# Patient Record
Sex: Female | Born: 1960
Health system: Southern US, Community
[De-identification: ages and names within clinical notes are randomized; demographics above are authoritative.]

## PROBLEM LIST (undated history)

## (undated) DIAGNOSIS — R269 Unspecified abnormalities of gait and mobility: Secondary | ICD-10-CM

## (undated) DIAGNOSIS — G35 Multiple sclerosis: Secondary | ICD-10-CM

## (undated) DIAGNOSIS — R519 Headache, unspecified: Secondary | ICD-10-CM

## (undated) DIAGNOSIS — F329 Major depressive disorder, single episode, unspecified: Secondary | ICD-10-CM

## (undated) DIAGNOSIS — R51 Headache: Secondary | ICD-10-CM

## (undated) DIAGNOSIS — F32A Depression, unspecified: Secondary | ICD-10-CM

## (undated) DIAGNOSIS — E785 Hyperlipidemia, unspecified: Secondary | ICD-10-CM

## (undated) HISTORY — DX: Major depressive disorder, single episode, unspecified: F32.9

## (undated) HISTORY — DX: Hyperlipidemia, unspecified: E78.5

## (undated) HISTORY — DX: Unspecified abnormalities of gait and mobility: R26.9

## (undated) HISTORY — DX: Depression, unspecified: F32.A

## (undated) HISTORY — PX: TUBAL LIGATION: SHX77

## (undated) HISTORY — DX: Multiple sclerosis: G35

## (undated) HISTORY — PX: DENTAL SURGERY: SHX609

## (undated) NOTE — *Deleted (*Deleted)
Integrated Behavioral Health Initial In-Person Visit  MRN: [id number] Name: Kristen Boyle  Number of Integrated Behavioral Health Clinician visits:: 1/6 Session Start time: 2:30pm  Session End time: 3:00pm Total time: 30 minutes  Types of Service: Individual psychotherapy  Interpretor:{yes [id number]} Interpretor Name and Language: ***   Warm Hand Off Completed.       Subjective: [name] is a 16 y.o. female accompanied by {CHL AMB ACCOMPANIED YN:[id number]} Patient was referred by *** for ***. Patient reports the following symptoms/concerns: *** Duration of problem: ***; Severity of problem: {Mild/Moderate/Severe:20260}  Objective: Mood: {BHH MOOD:22306} and Affect: {BHH AFFECT:22307} Risk of harm to self or others: {CHL AMB BH Suicide Current Mental Status:21022748}  Life Context: Family and Social: *** School/Work: *** Self-Care: *** Life Changes: ***  Patient and/or Family's Strengths/Protective Factors: {CHL AMB BH PROTECTIVE FACTORS:[phone]}  Goals Addressed: Patient will: 1. Reduce symptoms of: {IBH Symptoms:21014056} 2. Increase knowledge and/or ability of: {IBH Patient Tools:21014057}  3. Demonstrate ability to: {IBH Goals:21014053}  Progress towards Goals: {CHL AMB BH PROGRESS TOWARDS GOALS:6151297580}  Interventions: Interventions utilized: {IBH Interventions:21014054}  Standardized Assessments completed: {IBH Screening Tools:21014051}  Patient and/or Family Response: ***  Patient Centered Plan: Patient is on the following Treatment Plan(s):  ***  Assessment: Patient currently experiencing ***.   Patient may benefit from ***.  Plan: 1. Follow up with behavioral health clinician on : *** 2. Behavioral recommendations: *** 3. Referral(s): {IBH Referrals:21014055} 4. "From scale of 1-10, how likely are you to follow plan?": ***  Deneise Lever, LMFT

---

## 2010-07-03 ENCOUNTER — Emergency Department (HOSPITAL_COMMUNITY)
Admission: EM | Admit: 2010-07-03 | Discharge: 2010-07-03 | Disposition: A | Discharge status: Home / Self Care | Payer: No Typology Code available for payment source | Attending: Emergency Medicine | Admitting: Emergency Medicine

## 2010-07-03 ENCOUNTER — Emergency Department (HOSPITAL_COMMUNITY): Payer: Self-pay

## 2010-07-03 DIAGNOSIS — M542 Cervicalgia: Secondary | ICD-10-CM | POA: Insufficient documentation

## 2010-07-03 DIAGNOSIS — G35 Multiple sclerosis: Secondary | ICD-10-CM | POA: Insufficient documentation

## 2010-07-03 DIAGNOSIS — M25519 Pain in unspecified shoulder: Secondary | ICD-10-CM | POA: Insufficient documentation

## 2010-07-03 DIAGNOSIS — M25559 Pain in unspecified hip: Secondary | ICD-10-CM | POA: Insufficient documentation

## 2010-07-03 DIAGNOSIS — T148XXA Other injury of unspecified body region, initial encounter: Secondary | ICD-10-CM | POA: Insufficient documentation

## 2010-09-10 ENCOUNTER — Encounter: Payer: No Typology Code available for payment source | Admitting: Internal Medicine

## 2010-09-17 ENCOUNTER — Ambulatory Visit (INDEPENDENT_AMBULATORY_CARE_PROVIDER_SITE_OTHER): Payer: Self-pay | Admitting: Internal Medicine

## 2010-09-17 ENCOUNTER — Encounter: Payer: Self-pay | Admitting: Internal Medicine

## 2010-09-17 DIAGNOSIS — E119 Type 2 diabetes mellitus without complications: Secondary | ICD-10-CM

## 2010-09-17 DIAGNOSIS — E785 Hyperlipidemia, unspecified: Secondary | ICD-10-CM | POA: Insufficient documentation

## 2010-09-17 DIAGNOSIS — D649 Anemia, unspecified: Secondary | ICD-10-CM

## 2010-09-17 DIAGNOSIS — I10 Essential (primary) hypertension: Secondary | ICD-10-CM | POA: Insufficient documentation

## 2010-09-17 LAB — LIPID PANEL
Cholesterol: 246 mg/dL — ABNORMAL HIGH (ref 0–200)
Total CHOL/HDL Ratio: 3.2 Ratio
Triglycerides: 114 mg/dL (ref <150)
VLDL: 23 mg/dL (ref 0–40)

## 2010-09-17 LAB — CBC
Hemoglobin: 12.9 g/dL (ref 12.0–15.0)
MCH: 24.5 pg — ABNORMAL LOW (ref 26.0–34.0)
MCHC: 32.5 g/dL (ref 30.0–36.0)
MCV: 75.3 fL — ABNORMAL LOW (ref 78.0–100.0)
Platelets: 307 10*3/uL (ref 150–400)
RBC: 5.27 MIL/uL — ABNORMAL HIGH (ref 3.87–5.11)

## 2010-09-17 LAB — COMPREHENSIVE METABOLIC PANEL
CO2: 26 mEq/L (ref 19–32)
Creat: 0.98 mg/dL (ref 0.50–1.10)
Glucose, Bld: 86 mg/dL (ref 70–99)
Total Bilirubin: 0.4 mg/dL (ref 0.3–1.2)

## 2010-09-17 NOTE — Progress Notes (Signed)
  Subjective:    Patient ID: Kristen Boyle, female    DOB: April 21, 1960, 50 y.o.   MRN: [id number]  HPI Patient is a 50 year old female with history of multiple sclerosis diagnosed several years ago who presents to clinic as a new patient. She denies medical problems except multiple sclerosis which she is not sure when it was diagnosed that she has stopped her Inderal and that she follows with. She tells me she's currently not on any medications for MS. She is not sure if she has high blood pressure and high cholesterol but was told at one point if the numbers were slightly above the target goal. She takes no medications at the time. She denies recent sicknesses or hospitalizations, no abdominal or urinary concerns no fevers or chills. She reports feeling tired over the past several years but relays symptoms to menopause. She reports hot flashes and sweats and also believes is because of the menopause. She has not followed with any physician in [place] over the past few years.   Review of Systems Per HPI    Objective:   Physical Exam  Constitutional: Vital signs reviewed.  Patient is a well-developed and well-nourished in no acute distress and cooperative with exam. Alert and oriented x3.  Head: Normocephalic and atraumatic Ear: TM normal bilaterally Mouth: no erythema or exudates, MMM Eyes: PERRL, EOMI, conjunctivae normal, No scleral icterus.  Neck: Supple, Trachea midline normal ROM, No JVD, mass, thyromegaly, or carotid bruit present.  Cardiovascular: RRR, S1 normal, S2 normal, no MRG, pulses symmetric and intact bilaterally Pulmonary/Chest: CTAB, no wheezes, rales, or rhonchi Abdominal: Soft. Non-tender, non-distended, bowel sounds are normal, no masses, organomegaly, or guarding present.  GU: no CVA tenderness Musculoskeletal: No joint deformities, erythema, or stiffness, ROM full and no nontender Hematology: no cervical, inginal, or axillary adenopathy.  Neurological: A&O x3,  Strenght is normal and symmetric bilaterally, cranial nerve II-XII are grossly intact, no focal motor deficit, sensory intact to light touch bilaterally.  Skin: Warm, dry and intact. No rash, cyanosis, or clubbing.  Psychiatric: Normal mood and affect. speech and behavior is normal. Judgment and thought content normal. Cognition and memory are normal.         Assessment & Plan:

## 2010-09-17 NOTE — Assessment & Plan Note (Signed)
Patient is currently not on medication regimen. We will check fasting lipid panel as well as liver function test and will initiate treatment if indicated.

## 2010-09-17 NOTE — Assessment & Plan Note (Signed)
Blood pressure is slightly above the target goal. I have advised patient to continue monitoring her blood pressure regularly and to call back if the numbers are persistently high then 140/90. We will check electrolyte panel today. Will not initiate medical regimen at this point. Diet and exercise along with therapeutic lifestyle changes discussed during the appointment today.

## 2010-09-18 ENCOUNTER — Telehealth: Payer: Self-pay | Admitting: Internal Medicine

## 2010-09-18 NOTE — Telephone Encounter (Signed)
Pt scheduled for Thursday.

## 2010-09-18 NOTE — Telephone Encounter (Signed)
Can we please have pt come in for appointment to go over the blood work. We checked her liver tests and the results are above the normal limits and she also has hypercalcemia, so she will need repeat bloodwork, actually just cmet. I am not in clinic after today so maybe any other resident who is available can see her. Thank you for your help.

## 2010-09-20 ENCOUNTER — Encounter: Payer: Self-pay | Admitting: Internal Medicine

## 2010-09-20 ENCOUNTER — Ambulatory Visit (INDEPENDENT_AMBULATORY_CARE_PROVIDER_SITE_OTHER): Payer: Medicaid Other | Admitting: Internal Medicine

## 2010-09-20 ENCOUNTER — Other Ambulatory Visit: Payer: Medicaid Other

## 2010-09-20 VITALS — BP 141/89 | HR 73 | Temp 97.3°F | Wt 132.9 lb

## 2010-09-20 DIAGNOSIS — Z0000 Encounter for general adult medical examination without abnormal findings: Secondary | ICD-10-CM

## 2010-09-20 NOTE — Patient Instructions (Signed)
You were seen today to discuss your lab results with Dr. Dorise Hiss. We talked about how your thyroid is fine, you do not have diabetes, your kidneys are fine, your blood counts are fine, you have a little bit of increase in your liver enzymes and we are going to recheck them today. Your cholesterol is at the top of normal. Please try to work on your diet and increasing the amount of exercise you do. I will give you some stretches for your neck and diet tips today. Please come back in 6 months to repeat some lab work and meet your regular doctor, Dr. Milbert Coulter. If there are any problems with today's lab results I will call you. If you are feeling sick or have worsening of your pain please feel free to call us to be seen sooner. Our number is [phone].  Neck Pain exercises Home exercise - Home exercise maintains range of motion and helps patients become active participants in their care. Gentle stretching exercises, including shoulder rolls and neck stretches, should be instituted on a daily basis once acute symptoms are under control: Neck rotation - Slowly turn the head to the right. Place tension on the chin with the fingertips. Hold for a few seconds and return to the center. Repeat to the left.  Neck tilting - Tilt the head to the right, trying to touch the ear to the tip of the shoulder. Place tension on the temple with the fingertips. Hold for a few seconds and return to the center. Repeat to the left.  Neck bending - Try to touch the chin to the chest. Hold for a few seconds and return to the neutral position. Breathe in gradually, and exhale slowly with each exercise. Relax the neck and back muscles with each neck bend.  Shoulder rolls - In the sitting or standing position, pull the arms backwards. Try to pinch the shoulder blades together, and then roll the arms forward then backward in a rhythmic, rowing motion. Patients should be advised to heat their neck and upper back in a bathtub, shower, or  with moist towels heated in a microwave prior to beginning exercises. The muscles are gently stretched in sets of 10 to 15 repetitions with each held for five seconds. It is best to perform the exercises in the morning and just before sleeping. After the acute pain has resolved, stretching exercises should be continued three times per week to maintain neck flexibility.  Cholesterol Control Modern scientists have known for a long time that cholesterol levels in the body are related to coronary heart disease and that diet affects these levels. The following material helps to explain this relationship and discusses what you can do to help keep your heart healthy. Not all cholesterol is bad. Low-density lipoprotein (LDL) cholesterol is the "bad" cholesterol which may cause fatty deposits to build up inside your arteries. High-density lipoprotein (HDL) cholesterol is "good". It helps to remove the "bad" LDL cholesterol from your blood. Cholesterol is a very important risk factor for coronary heart disease. Other risk factors are high blood pressure, smoking, stress, heredity, and weight. The heart muscle gets its supply of blood through the coronary arteries. If your LDL ("bad") cholesterol is high and your HDL ("good") cholesterol is low, you have a risk factor for fatty deposits accumulating in your coronary arteries (a vessel providing blood to the heart). This leaves less room through which blood can flow. Without sufficient blood and the oxygen, the heart muscle cannot function properly, and  you may feel chest pains (called angina pectoris). When a coronary artery closes up entirely, a part of the heart muscle may die (called a myocardial infarction). CHECKING CHOLESTEROL When your caregiver sends your blood to a lab to be analyzed for cholesterol, they may do a complete lipid (fat) profile. With this test the total amount of cholesterol, as well as levels of LDL and HDL, are determined. The chart below  describes what the numbers should be: Test Goal  Total Cholesterol Less than 200 mg/dl  LDL "bad cholesterol" Less than 160 mg/dl for those at low risk for heart disease Less than 130 mg/dl for those at intermediate risk for heart disease. Less than 100 mg/dl for those with diabetes or at high risk for heart disease Less than 70 mg/dl for those at very high risk for heart disease  HDL "good cholesterol" Women: Greater than 50 mg/dl Men: Greater than 40 mg/dl  Triglycerides Less than 150 mg/dl  Reference: [email or web address] 02/04/06 CONTROLLING CHOLESTEROL WITH DIET Although such factors as exercise and life-style are important, the "first line of attack" is diet. That is because certain foods are known to raise cholesterol and others to lower it. So the goal for most Americans is to balance foods for their effect on cholesterol and, even more important, to replace saturated fat with other types of fat, such as monounsaturated and polyunsaturated fats, and omega-3 fatty acids. The average American takes in about 500 to 600 milligrams (mg) of cholesterol and about 40 grams (g) of saturated fat daily. Ideally, these figures should be reduced to 300 mg of cholesterol and 15 to 17 g of saturated fat, or even lower in people who have coronary artery disease or a history of heart attack. But that does not mean you have to sacrifice all your favorite foods. Today, as the table at the end of this document shows, there are good-tasting, low-fat, low-cholesterol substitutes for most of the things you like to eat. Which foods should you choose? Choose low-fat or non-fat alternatives. Choose round or loin cuts of red meat as these types of cuts are lowest in fat and cholesterol. Chicken (without the skin), fish, veal and ground [place] breast are excellent choices. Eliminate fatty meats like hotdogs and salami. Even shellfish have little or no saturated fat and so, despite their high cholesterol  content, are allowable in moderation. When you eat lean meat, poultry, or fish, have a 3 oz. portion. For baking and cooking, oils are an excellent substitute for butter. The monounsaturated oils are of particular benefit since it is believed they lower LDL (the bad cholesterol) but raise HDL. The oils you should avoid entirely are the saturated tropical oils such as coconut and palm.  Remember to eat liberally from food groups that are naturally free of cholesterol and saturated fat, including fish, fruit, vegetables, beans, grains (barley, rice, couscous, bul-gur wheat), and pasta (without cream sauces).  IDENTIFYING FOODS THAT LOWER CHOLESTEROL . . . Soluble fiber found in fruits such as apples; vegetables such as broccoli, potatoes, and carrots; legumes such as beans, peas, and lentils; and grains such as barley may lower your cholesterol. Foods fortified with plant sterols, or phytosterol, may also lower cholesterol. You should eat at least 2g per day of these foods for a cholesterol lowering effect.  How can you identify low-cholesterol, low-fat foods at the supermarket? The key is to read package labels. Select cheeses that have only 2-3 g saturated fat per ounce. Use a heart healthy tub  margarine free of partially hydrogenated oil such as Insurance claims handler. When buying baked goods (cookies, crackers), avoid partially hydrogenated oils. Breads and muffins should be made from whole grains (whole wheat or whole oat flour, instead of just "flour" or "enriched flour"). Buy non-creamy canned soups with reduced salt and no added fats.  FOOD PREPARATION TECHNIQUES . . . Never deep fry. If you must fry, either stir fry, which uses very little fat, or use the non-stick cooking sprays like Pam. When possible, broil, bake, or roast meats, and steam vegetables. Instead of dressing vegetables with butter or margarine, use lemon and herbs, applesauce and cinnamon (for squash and sweet potatoes), non-fat  yogurt, salsa, and low-fat dressings for salads.  See the following table for food, cholesterol, and fat information. FOODS HIGH IN CHOLESTEROL AND/OR SATURATED FAT LOW CHOLESTEROL/LOW-FAT SUBSTITUTES   Cholesterol (milligrams) Saturated Fat (grams)  Cholesterol (milligrams) Saturated Fat (grams)  Steak, marbled (3 oz) 90 11 Steak, lean (3 oz) 50 4  Hamburger (3 oz) 80 7 Hamburger, lean (3 oz) 50 5  Ham (3 oz) 53 6 Ham, lean cut (3 oz) 35 2.4  Chicken, with skin  (3 oz)   Chicken, skin removed, 3 oz    Dark meat 90 4 Dark meat 80 2  Light meat 80 2.5 Light meat 70 1  Egg (1 Large) 200-300 1.7 Egg substitutes ("Eggbeaters") 0 0  Whole Milk (1 cup) 33 5 Low-fat milk (2%)  (1 cup) 18 3     Low-fat milk (1%)  (1 cup) 10 1.5     Skim milk (1 cup) trace 0.3  Hard Cheese (1 oz) 30 6 Skim milk cheese  (1 oz) 16 2-3  Cottage Cheese  (1 cup) (4% fat) 35 6.5 Low-fat cottage cheese (1 cup) (1% fat) 10 1.5  Ice cream (1 cup) 60 9 Sherbet (1 cup) 14 2.5     Non-fat frozen yogurt (1 cup) 0 0.3     Frozen fruit bars 0 trace  Whipped cream  (1 tbsp) 20 3.5 Non-dairy whipped toppings (1 tbsp) trace 1  Mayonnaise  (1 tbsp) 5 2 Low-fat mayonnaise (1 tbsp) 5 1  Butter (1 tbsp) 30 7 Extra light margarines ( tbsp) 0 1  Oils (1 tbsp)   Oils (1 tbsp)    Saturated   Monounsaturated    Coconut oil 0 11.8 Olive 0 1.8     Polyunsaturated       Corn 0 1.7     Safflower 0 1.2     Sunflower 0 1.4     Soybean 0 2.4  Document Released: 01/07/2005 Document Re-Released: 11/04/2006 [facility] Patient Information 2011 [place], [place].

## 2010-09-20 NOTE — Progress Notes (Signed)
Subjective:    Patient ID: Kristen Boyle, female    DOB: May 23, 1960, 50 y.o.   MRN: [id number]  HPI: The patient is a 50 year-old female who comes in today to discuss the results of her lab work. She was new to our clinic approximately 52 years ago and did get some lab work done, we did discuss the results of those lab work with her today. I did discuss the results of her TSH, hemoglobin A1c, CBC, CMP, cholesterol panel. The only slightly abnormal lab was a CMP value, her alkaline phosphatase was 151, AST 43, ALT 61, calcium 10.7. Her cholesterol was borderline high and I did discuss diet modification as well as adding exercise to her regimen to decrease this. I did tell her that we would see her back in 6 months to recheck some lab work as well for her to meet her new doctor, Dr. Milbert Coulter. She is a nonsmoker. She does not take any medications. She is having some stiffness in her neck.    Review of Systems  Constitutional: Negative.  Negative for fever, chills, activity change, appetite change, fatigue and unexpected weight change.  HENT: Negative.   Eyes: Negative.   Respiratory: Negative.  Negative for cough, choking, chest tightness and shortness of breath.   Cardiovascular: Negative.  Negative for chest pain and palpitations.  Gastrointestinal: Negative.  Negative for nausea, vomiting, abdominal pain, diarrhea and constipation.  Musculoskeletal: Positive for myalgias. Negative for back pain, joint swelling, arthralgias and gait problem.       Pt has stiff neck.  Skin: Negative.   Neurological: Negative.  Negative for dizziness, syncope, speech difficulty, weakness, light-headedness, numbness and headaches.  Hematological: Negative.   Psychiatric/Behavioral: Negative.     Vitals: Blood pressure: 141/89 Pulse: 73 Temperature: 97.93F Weight 132 pounds    Objective:   Physical Exam  Constitutional: She is oriented to person, place, and time. She appears well-developed and  well-nourished. No distress.  HENT:  Head: Normocephalic and atraumatic.  Eyes: EOM are normal. Pupils are equal, round, and reactive to light.  Neck: Normal range of motion. Neck supple. No tracheal deviation present. No thyromegaly present.  Cardiovascular: Normal rate, regular rhythm and normal heart sounds.   No murmur heard. Pulmonary/Chest: Effort normal and breath sounds normal. No respiratory distress. She has no wheezes. She has no rales.  Abdominal: Soft. Bowel sounds are normal. She exhibits no distension. There is no tenderness. There is no rebound and no guarding.  Musculoskeletal: Normal range of motion. She exhibits tenderness. She exhibits no edema.       Pt does have stiff neck, but full ROM.  Lymphadenopathy:    She has no cervical adenopathy.  Neurological: She is alert and oriented to person, place, and time. No cranial nerve deficit.  Skin: Skin is warm and dry. No rash noted. No erythema. No pallor.  Psychiatric: She has a normal mood and affect. Her behavior is normal. Judgment and thought content normal.          Assessment & Plan:  1. Borderline hypertension-patient blood pressure 141/89 today. No medication today. She will work on diet and exercise. Recheck in 6 months.  2. Borderline hyperlipidemia-patient LDL is 145. We did discuss diet and exercise modification. She will work on those. We will recheck in 6 months. No medication today.  3. Neck stiffness-patient was given some stretching exercises. She was told she could use heat or cold on her neck if it made her feel better. I  did inform her that if it worsens or does not get better she is free to call our clinic to come in to be seen again.  4. Disposition-patient will be seen back in 6 months to recheck a CMP, and recheck a fasting lipid panel. She will work on diet and exercise modification between now and then. I did give her some exercises for her neck which she states has been stiff for several days. I  did tell her that if she is feeling worse or feeling sick she is free to call our clinic at any time to be seen sooner. We did do a repeat CMP today and I will call her if the results are abnormal.

## 2010-09-21 LAB — COMPLETE METABOLIC PANEL WITH GFR
BUN: 15 mg/dL (ref 6–23)
CO2: 23 mEq/L (ref 19–32)
Creat: 0.85 mg/dL (ref 0.50–1.10)
GFR, Est African American: >60 mL/min (ref >60)
GFR, Est Non African American: >60 mL/min (ref >60)
Glucose, Bld: 80 mg/dL (ref 70–99)
Total Bilirubin: 0.3 mg/dL (ref 0.3–1.2)
Total Protein: 8.5 g/dL — ABNORMAL HIGH (ref 6.0–8.3)

## 2010-09-25 NOTE — Progress Notes (Signed)
I have reviewed and discussed the care of this patient in detail with the resident physician including pertinent patient records, physical exam findings and data. I agree with details of this encounter.   

## 2011-03-06 ENCOUNTER — Other Ambulatory Visit (HOSPITAL_COMMUNITY)
Admission: RE | Admit: 2011-03-06 | Discharge: 2011-03-06 | Disposition: A | Discharge status: Home / Self Care | Payer: Medicaid Other | Source: Ambulatory Visit | Attending: Internal Medicine | Admitting: Internal Medicine

## 2011-03-06 ENCOUNTER — Encounter: Payer: Self-pay | Admitting: Internal Medicine

## 2011-03-06 ENCOUNTER — Ambulatory Visit (INDEPENDENT_AMBULATORY_CARE_PROVIDER_SITE_OTHER): Payer: Medicaid Other | Admitting: Internal Medicine

## 2011-03-06 VITALS — BP 118/70 | HR 76 | Temp 97.7°F | Ht 60.0 in | Wt 142.9 lb

## 2011-03-06 DIAGNOSIS — Z01419 Encounter for gynecological examination (general) (routine) without abnormal findings: Secondary | ICD-10-CM | POA: Insufficient documentation

## 2011-03-06 DIAGNOSIS — H538 Other visual disturbances: Secondary | ICD-10-CM

## 2011-03-06 DIAGNOSIS — I10 Essential (primary) hypertension: Secondary | ICD-10-CM

## 2011-03-06 DIAGNOSIS — Z1239 Encounter for other screening for malignant neoplasm of breast: Secondary | ICD-10-CM

## 2011-03-06 DIAGNOSIS — N898 Other specified noninflammatory disorders of vagina: Secondary | ICD-10-CM

## 2011-03-06 DIAGNOSIS — N760 Acute vaginitis: Secondary | ICD-10-CM | POA: Insufficient documentation

## 2011-03-06 DIAGNOSIS — Z0000 Encounter for general adult medical examination without abnormal findings: Secondary | ICD-10-CM | POA: Insufficient documentation

## 2011-03-06 DIAGNOSIS — Z23 Encounter for immunization: Secondary | ICD-10-CM

## 2011-03-06 DIAGNOSIS — G35 Multiple sclerosis: Secondary | ICD-10-CM

## 2011-03-06 DIAGNOSIS — Z124 Encounter for screening for malignant neoplasm of cervix: Secondary | ICD-10-CM

## 2011-03-06 DIAGNOSIS — R232 Flushing: Secondary | ICD-10-CM

## 2011-03-06 DIAGNOSIS — E785 Hyperlipidemia, unspecified: Secondary | ICD-10-CM

## 2011-03-06 DIAGNOSIS — R748 Abnormal levels of other serum enzymes: Secondary | ICD-10-CM

## 2011-03-06 MED ORDER — GABAPENTIN 100 MG PO CAPS: ORAL_CAPSULE | ORAL | Status: DC

## 2011-03-06 NOTE — Progress Notes (Signed)
Subjective:   Patient ID: Kristen Boyle female   DOB: [birth year] 51 y.o.   MRN: [id number]  HPI: Kristen Boyle is a 51 y.o. woman that presents for regular follow up and requests referral to neurology, as she has not been followed for her history of MS for the last 2 years.   1. Left eye vision:  worse, can see colors not words, unclear if this is related to her MS  2. Balance with walking deteriorated, starts dragging after walking 10 min, dx with MS in 1999, stopped medications 3 years ago (uncomfortable giving herself injections every other day), last saw neuro 2 years ago (at [facility], will need records). 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 HAs this past weekend and yesterday, not today, b/l frontal HA, chronic - 10 years, relief with ibuprofen, 5/10, throbbing when occurs, frequency once every 2 weeks.    4. C/o hot flashes, day & night; started at 51 yo, gradually progressed to profuse sweating, no precipitating factor, better with water and fan; no vaginal dryness, but endorses discharge (brown, about 1 year) no sexual activity since last March  5. constipation, uses flax seeds, regulates well   Past Medical History  Diagnosis Date  . MS (multiple sclerosis)   . Hyperlipidemia   . Hypertension    No current outpatient prescriptions on file.   Family History  Problem Relation Age of Onset  . Colon cancer Maternal Aunt    History   Social History  . Marital Status: Single    Spouse Name: N/A    Number of Children: N/A  . Years of Education: N/A   Social History Main Topics  . Smoking status: Never Smoker   . Smokeless tobacco: None  . Alcohol Use: No  . Drug Use: No  . Sexually Active: No   Other Topics Concern  . None   Social History Narrative  . None   Review of Systems: Constitutional: Denies fever, chills, appetite change and fatigue.  HEENT: Denies photophobia,  congestion, sore throat, rhinorrhea, sneezing, mouth sores, trouble swallowing, neck pain, neck stiffness and tinnitus.   Respiratory: Denies SOB, DOE, cough, chest tightness,  and wheezing.   Cardiovascular: Denies chest pain, palpitations and leg swelling.  Gastrointestinal: Denies nausea, vomiting, abdominal pain, diarrhea, blood in stool and abdominal distention.  Musculoskeletal: Denies myalgias, back pain, joint swelling, arthralgias and gait problem.  Skin: Denies pallor, rash and wound.  Neurological: Denies dizziness, seizures, syncope, weakness, light-headedness.   Psychiatric/Behavioral: Denies suicidal ideation, mood changes, confusion, nervousness, sleep disturbance and agitation  Objective:  Physical Exam: Filed Vitals:   03/06/11 1550  BP: 118/70  Pulse: 76  Temp: 97.7 F (36.5 C)  TempSrc: Oral  Height: 5' (1.524 m)  Weight: 142 lb 14.4 oz (64.819 kg)   Constitutional: Vital signs reviewed.  Patient is a well-developed and well-nourished woman in no acute distress and cooperative with exam. Mouth: no erythema or exudates, MMM Eyes: PERRL, EOMI, conjunctivae normal, No scleral icterus.  Neck:  No thyromegaly Cardiovascular: RRR, S1 normal, S2 normal, no MRG, pulses symmetric and intact bilaterally Pulmonary/Chest: CTAB, no wheezes, rales, or rhonchi Abdominal: Soft. Non-tender, non-distended, bowel sounds are normal, no masses, organomegaly, or guarding present.  Musculoskeletal: No joint deformities, erythema, or stiffness, ROM full and no nontender Neurological: A&O x3, Strength is normal and symmetric bilaterally, cranial nerve II-XII are grossly intact, no focal motor deficit, sensory decreased on LLE ,  unsteady gait, no intention tremor Genitourinary: no cervical motion tenderness on vaginal exam, yellow-brown discharge in speculum Skin: Warm, dry and intact. No rash, cyanosis, or clubbing.  Psychiatric: Normal mood and affect. speech and behavior is normal.  Judgment and thought content normal. Cognition and memory are normal.   Assessment & Plan:  Case and care discussed with Dr. Rogelia Boga.  Patient to return in 1 month to reassess hot flashes.  Please see problem oriented charting for further details.

## 2011-03-06 NOTE — Assessment & Plan Note (Signed)
Lab Results  Component Value Date   NA 140 09/20/2010   K 4.2 09/20/2010   CL 102 09/20/2010   CO2 23 09/20/2010   BUN 15 09/20/2010   CREATININE 0.85 09/20/2010    BP Readings from Last 3 Encounters:  03/06/11 118/70  09/20/10 141/89  09/17/10 134/92    Assessment: Hypertension control:  controlled  Progress toward goals:  at goal Barriers to meeting goals:  no barriers identified  Plan: Hypertension treatment:  Pt currently not on any treatment, continue to monitor

## 2011-03-06 NOTE — Patient Instructions (Addendum)
-  I have made referrals to Neurology, Opthamology, Gastroenterology (for colonoscopy) and to Radiology (mammogram)  -We are going to try Gabapentin for your hot flashes.   Day 1: 300mg  at bedtime, Day 2: 300mg  twice a day, Day 3: 300mg  three times daily for 4 weeks, then we will taper off Please return in 1 month for follow up regarding hot flashes  -If your pap results or lab results are abnormal, I will call you.  -If you have any new or worsening symptoms, please call [phone]

## 2011-03-06 NOTE — Assessment & Plan Note (Addendum)
Isolated left eye; Chronic, EOM intact, unclear if related to MS, referral to opthalmology to assess for cataracts/glaucoma, pt is not sure when last eye exam was, but has tried drug store readers

## 2011-03-06 NOTE — Assessment & Plan Note (Signed)
Currently not on medication.  Pt believes gait to be deteriorating.  Referral to neurology placed today

## 2011-03-06 NOTE — Assessment & Plan Note (Signed)
No recent mammogram, referral for screening mammo placed today at pt's request

## 2011-03-06 NOTE — Assessment & Plan Note (Signed)
Referral for colonoscopy and mammo placed today.  Pap screen done today.  Tdap given today.

## 2011-03-06 NOTE — Assessment & Plan Note (Signed)
Have been elevated on 2 occurrences, and ALT>AST, so will check hepatitis panel today.  Alk phos elevated as well, but pt has no c/o abdominal pain

## 2011-03-06 NOTE — Assessment & Plan Note (Signed)
Has progressively worsened.  Will do trial of gabapentin: Day 1 - 300mg  at bed time, Day 2 - 300mg  BID, then 300mg  TID for 4 weeks, with plan to taper off; if no relief with gabapentin, will do trial of SSRI vs low dose estrogen.

## 2011-03-06 NOTE — Assessment & Plan Note (Signed)
Vaginal exam done today, no cervical motion tenderness & no recent sexual activity.  Wet prep sent.

## 2011-03-07 LAB — HEPATITIS PANEL, ACUTE
Hep A IgM: NEGATIVE
Hep B C IgM: NEGATIVE
Hepatitis B Surface Ag: NEGATIVE

## 2011-04-08 ENCOUNTER — Telehealth: Payer: Self-pay | Admitting: Internal Medicine

## 2011-04-08 NOTE — Telephone Encounter (Signed)
Patient kept Appointment with [facility] on 04/05/2011.  Office visit notes have been received.

## 2011-05-29 ENCOUNTER — Telehealth: Payer: Self-pay | Admitting: *Deleted

## 2011-05-29 NOTE — Telephone Encounter (Signed)
Call from [place] at [facility].  They need to refer patient to [facility] [phone].  Because of pt's medicaid, we must approve.  Discussed with PCP, approval given.Kingsley Spittle Cass[place]5/8/20134:39 PM

## 2011-05-31 ENCOUNTER — Encounter: Payer: Self-pay | Admitting: Internal Medicine

## 2011-05-31 ENCOUNTER — Ambulatory Visit (HOSPITAL_COMMUNITY)
Admission: RE | Admit: 2011-05-31 | Discharge: 2011-05-31 | Disposition: A | Discharge status: Home / Self Care | Payer: Medicaid Other | Source: Ambulatory Visit | Attending: Internal Medicine | Admitting: Internal Medicine

## 2011-05-31 ENCOUNTER — Ambulatory Visit (INDEPENDENT_AMBULATORY_CARE_PROVIDER_SITE_OTHER): Payer: Medicaid Other | Admitting: Internal Medicine

## 2011-05-31 VITALS — BP 117/78 | HR 67 | Temp 97.1°F | Ht 60.0 in | Wt 149.2 lb

## 2011-05-31 DIAGNOSIS — M79609 Pain in unspecified limb: Secondary | ICD-10-CM | POA: Insufficient documentation

## 2011-05-31 DIAGNOSIS — G35D Multiple sclerosis, unspecified: Secondary | ICD-10-CM

## 2011-05-31 DIAGNOSIS — M7989 Other specified soft tissue disorders: Secondary | ICD-10-CM | POA: Insufficient documentation

## 2011-05-31 DIAGNOSIS — R748 Abnormal levels of other serum enzymes: Secondary | ICD-10-CM

## 2011-05-31 DIAGNOSIS — I10 Essential (primary) hypertension: Secondary | ICD-10-CM

## 2011-05-31 DIAGNOSIS — R002 Palpitations: Secondary | ICD-10-CM

## 2011-05-31 DIAGNOSIS — G35 Multiple sclerosis: Secondary | ICD-10-CM

## 2011-05-31 DIAGNOSIS — R300 Dysuria: Secondary | ICD-10-CM

## 2011-05-31 DIAGNOSIS — R232 Flushing: Secondary | ICD-10-CM

## 2011-05-31 LAB — COMPREHENSIVE METABOLIC PANEL
AST: 21 U/L (ref 0–37)
BUN: 13 mg/dL (ref 6–23)
Calcium: 10.2 mg/dL (ref 8.4–10.5)
Chloride: 101 mEq/L (ref 96–112)
Creat: 0.84 mg/dL (ref 0.50–1.10)

## 2011-05-31 MED ORDER — PAROXETINE HCL 20 MG PO TABS: 20.0000 mg | ORAL_TABLET | ORAL | Status: DC

## 2011-05-31 NOTE — Progress Notes (Signed)
Subjective:   Patient ID: Kristen Boyle female   DOB: 1960/02/18 51 y.o.   MRN: [id number]  HPI: [name] is a 51 y.o. woman with past medical history significant for multiple sclerosis who presents today with left lower extremity swelling for 3 weeks.  She denies trauma 3 weeks prior, no recent motor vehicle accident or long trip. She is not on any hormone replacement therapy. She thought that it may be related to either gabapentin or steroids which were prescribed by neurology, so she discontinued both. Her left lower extremity edema persists and is painful. It is most painful in the lateral aspect of her left foot, and she describes numb sensation. She has tried ibuprofen with no relief. Elevating her leg, walking, or wearing shoes exacerbate her symptoms. She has never had similar symptoms in the past.  Today she also tells me that her sweating has recurred, since discontinuing gabapentin. She cannot sleep through the night, she wakes up drenched in sweat. She does suspect some vaginal irritation, she had some dysuria this morning and yesterday. She notes that the dysuria is at the start of her stream. She notes 3 episodes of urinary incontinence over the last 2 weeks. It is unclear if this is related to an infection or multiple sclerosis.  Regarding her multiple sclerosis, she feels as if her walking and balance are worse.  Today she also endorses heart palpitations without chest pain or shortness of breath. Occasionally palpitations resolved with relaxing. She thinks this may be related to anxiety, but she's never had these symptoms before either. She is undergoing more stressors in life as she is trying to move to [place] permanently, and is dealing with her worsening MS.    Past Medical History  Diagnosis Date  . MS (multiple sclerosis)   . Hyperlipidemia    Current Outpatient Prescriptions  Medication Sig Dispense Refill  . PARoxetine (PAXIL) 20 MG tablet Take 1  tablet (20 mg total) by mouth every morning.  30 tablet  2   Family History  Problem Relation Age of Onset  . Colon cancer Maternal Aunt    History   Social History  . Marital Status: Single    Spouse Name: N/A    Number of Children: N/A  . Years of Education: N/A   Social History Main Topics  . Smoking status: Never Smoker   . Smokeless tobacco: None  . Alcohol Use: No  . Drug Use: No  . Sexually Active: No   Other Topics Concern  . None   Social History Narrative  . None   Review of Systems: Constitutional: Denies fever, chills, appetite change and fatigue.  HEENT: Denies photophobia, eye pain, redness, hearing loss, ear pain, congestion, sore throat, rhinorrhea, sneezing, mouth sores, trouble swallowing, neck pain, neck stiffness and tinnitus.   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   Cardiovascular: Denies chest pain Gastrointestinal: Denies nausea, vomiting, abdominal pain, diarrhea, constipation, blood in stool and abdominal distention.  Genitourinary: Denies hematuria, flank pain and difficulty urinating.  Musculoskeletal: Denies myalgias, back pain, joint swelling, arthralgias Skin: Denies pallor, rash and wound.  Neurological: Denies dizziness, seizures, syncope, light-headedness, and headaches.  Psychiatric/Behavioral: Denies suicidal ideation,  Confusion and agitation  Objective:  Physical Exam: Filed Vitals:   05/31/11 1119  BP: 117/78  Pulse: 67  Temp: 97.1 F (36.2 C)  TempSrc: Oral  Height: 5' (1.524 m)  Weight: 149 lb 3.2 oz (67.677 kg)   Constitutional: Vital signs reviewed.  Patient is a well-developed and well-nourished woman in no acute distress and cooperative with exam.  Mouth: no erythema or exudates, MMM, poor dentition Eyes: PERRL, EOMI, conjunctivae normal, No scleral icterus.  Neck: No thyromegaly Cardiovascular: RRR, S1 normal, S2 normal,  no MRG, pulses symmetric and intact bilaterally, extremities are warm to touch without pallor Pulmonary/Chest: CTAB, no wheezes, rales, or rhonchi Abdominal: Soft. Non-tender, non-distended, bowel sounds are normal Musculoskeletal: No joint deformities, erythema, or stiffness, ROM full and no nontender; left ankle and calf are circumferentially larger than right with positive Homans sign  Neurological: A&O x3, Strength is 4/5 left lower extremity, cranial nerve II-XII are grossly intact, sensory intact to light touch bilaterally.  unsteady gait Skin: Warm, dry and intact. No rash, cyanosis, or clubbing.  Psychiatric: Normal mood and affect. speech and behavior is normal. Judgment and thought content normal. Cognition and memory are normal.  patient became teary-eyed as she discussed  urinary incontinence  Assessment & Plan:   Case and care discussed with Dr. Josem Kaufmann. Please see problem-oriented charting for further details. Patient to return in 2 months for followup regarding initiation of Paxil and vasomotor symptoms.

## 2011-05-31 NOTE — Assessment & Plan Note (Signed)
Patient has had some isolated elevated blood pressures, but recent blood pressures have been below systolic 120. She is not currently on an antihypertensive regimen. We will continue to monitor.

## 2011-05-31 NOTE — Assessment & Plan Note (Signed)
Hepatitis panel was negative in February. ALT and alkaline phosphatase are trending down, but remain elevated. I will recheck cmet today

## 2011-05-31 NOTE — Assessment & Plan Note (Addendum)
May be related to anxiety, but in the setting of possible DVT, cannot rule out pulmonary embolus. A revised Geneva score is 3, which is low probability. I will check a d-dimer. Please see discussion for possible DVT.  TSH in August 2012 was within normal limits.  If studies are likely negative for DVT, then we will see if symptoms resolve with Paxil.  Addendum: D-dimer was negative, and thus my suspicion of pulmonary embolus is low. We will followup after initiation of Paxil

## 2011-05-31 NOTE — Assessment & Plan Note (Signed)
Patient is being seen by [facility]. The plan is to start patient on [place] after cardiac evaluation. She feels that her gait and balance are worsening. I will refer her to physical therapy.

## 2011-05-31 NOTE — Patient Instructions (Addendum)
-  Please go directly to radiology for an ultrasound of your left leg  -I am going to refer you to physical therapy so that we can build your stamina up  -I'm going to recheck your liver enzymes, as they have been mildly elevated in the past  -I will also check a urine sample to see if you have an infection, I will call you with the results.  -I'm going to start a new medication called Paroxetine (Paxil), which I am hopeful help with your sweating.  If you have any side effects, such as change in mood or feelings of hurting herself, please stop taking medication and call the clinic.  Please be sure to bring all of your medications with you to every visit.  Should you have any new or worsening symptoms, please be sure to call the clinic at [phone].

## 2011-05-31 NOTE — Assessment & Plan Note (Addendum)
Patient has had swelling of the left lower extremity for 3 weeks. Unclear etiology. She has a positive Homans. I would like to get a stat lower extremity venous Doppler, but because this requires precertification I will start with a d-dimer. If d-dimer is elevated, this will be more reason for stat Doppler. If we are unable to get the study, we may have to start empiric Lovenox.  Addendum: D-dimer was negative, which has a high sensitivity. Differentials at this time include Baker's cyst, superficial thrombosis, or an infrapopliteal DVT which has about 25% chance of progression to PE. Given negative d-dimer, I do not believe that we need to initiate empiric anticoagulation at this time. I'm comfortable having ultrasound done as soon as possible if it cannot be done today.

## 2011-05-31 NOTE — Assessment & Plan Note (Signed)
Patient describes burning at the initiation of urination accompanied with 3 episodes of bladder incontinence. Bladder incontinence may be related to multiple sclerosis, and dysuria may be related to vaginal atrophy due to vasomotor symptoms she is postmenopausal. I will check a urinalysis to rule out infection.

## 2011-05-31 NOTE — Assessment & Plan Note (Signed)
Patient reports that gabapentin helped, but she discontinued medication as she thought it was related to swelling. Also gabapentin was only to be a four-week course. Since medication discontinuation, her sweating has again worsened. At this time I will start paroxetine 20 mg daily and have her followup in 2 months to assess symptoms.

## 2011-05-31 NOTE — Progress Notes (Signed)
VASCULAR LAB PRELIMINARY  PRELIMINARY  PRELIMINARY  PRELIMINARY  Left lower extremity venous duplex completed.    Preliminary report:  Left:  No evidence of DVT, superficial thrombosis, or Baker's cyst.  Katisha Shimizu D, RVS 05/31/2011, 2:46 PM

## 2011-06-01 LAB — URINALYSIS, ROUTINE W REFLEX MICROSCOPIC
Ketones, ur: NEGATIVE mg/dL
Nitrite: NEGATIVE
Specific Gravity, Urine: 1.01 (ref 1.005–1.030)
pH: 7 (ref 5.0–8.0)

## 2011-06-01 LAB — URINALYSIS, MICROSCOPIC ONLY
Crystals: NONE SEEN
Squamous Epithelial / LPF: NONE SEEN

## 2011-06-21 ENCOUNTER — Ambulatory Visit: Payer: Medicaid Other | Attending: Internal Medicine | Admitting: Physical Therapy

## 2011-06-21 DIAGNOSIS — IMO0001 Reserved for inherently not codable concepts without codable children: Secondary | ICD-10-CM | POA: Insufficient documentation

## 2011-06-21 DIAGNOSIS — R269 Unspecified abnormalities of gait and mobility: Secondary | ICD-10-CM | POA: Insufficient documentation

## 2011-06-21 DIAGNOSIS — M6281 Muscle weakness (generalized): Secondary | ICD-10-CM | POA: Insufficient documentation

## 2011-06-28 ENCOUNTER — Ambulatory Visit: Payer: Medicaid Other | Attending: Internal Medicine | Admitting: Physical Therapy

## 2011-06-28 ENCOUNTER — Ambulatory Visit: Payer: Medicaid Other | Admitting: Physical Therapy

## 2011-06-28 DIAGNOSIS — IMO0001 Reserved for inherently not codable concepts without codable children: Secondary | ICD-10-CM | POA: Insufficient documentation

## 2011-06-28 DIAGNOSIS — R269 Unspecified abnormalities of gait and mobility: Secondary | ICD-10-CM | POA: Insufficient documentation

## 2011-06-28 DIAGNOSIS — M6281 Muscle weakness (generalized): Secondary | ICD-10-CM | POA: Insufficient documentation

## 2011-07-05 ENCOUNTER — Ambulatory Visit: Payer: Medicaid Other | Admitting: Physical Therapy

## 2011-07-12 ENCOUNTER — Ambulatory Visit: Payer: Medicaid Other | Admitting: Physical Therapy

## 2011-07-26 ENCOUNTER — Ambulatory Visit: Payer: Medicaid Other | Admitting: Physical Therapy

## 2011-09-10 ENCOUNTER — Encounter: Payer: Medicaid Other | Admitting: Internal Medicine

## 2011-09-19 ENCOUNTER — Encounter: Payer: Medicaid Other | Admitting: Internal Medicine

## 2011-09-19 ENCOUNTER — Ambulatory Visit (INDEPENDENT_AMBULATORY_CARE_PROVIDER_SITE_OTHER): Payer: Medicaid Other | Admitting: Internal Medicine

## 2011-09-19 ENCOUNTER — Encounter: Payer: Self-pay | Admitting: Internal Medicine

## 2011-09-19 VITALS — BP 134/85 | HR 72 | Temp 97.5°F | Resp 20 | Ht 61.5 in | Wt 148.1 lb

## 2011-09-19 DIAGNOSIS — Z1231 Encounter for screening mammogram for malignant neoplasm of breast: Secondary | ICD-10-CM

## 2011-09-19 DIAGNOSIS — F329 Major depressive disorder, single episode, unspecified: Secondary | ICD-10-CM

## 2011-09-19 DIAGNOSIS — G35 Multiple sclerosis: Secondary | ICD-10-CM

## 2011-09-19 DIAGNOSIS — F3289 Other specified depressive episodes: Secondary | ICD-10-CM

## 2011-09-19 DIAGNOSIS — Z0000 Encounter for general adult medical examination without abnormal findings: Secondary | ICD-10-CM

## 2011-09-19 DIAGNOSIS — F32A Depression, unspecified: Secondary | ICD-10-CM | POA: Insufficient documentation

## 2011-09-19 DIAGNOSIS — R232 Flushing: Secondary | ICD-10-CM

## 2011-09-19 MED ORDER — PAROXETINE HCL 20 MG PO TABS: 20.0000 mg | ORAL_TABLET | ORAL | Status: DC

## 2011-09-19 NOTE — Assessment & Plan Note (Signed)
Reports colonoscopy was done.   We will need to get records. Mammo will be scheduled today.

## 2011-09-19 NOTE — Patient Instructions (Signed)
-  Please start taking Paroxetine 20mg  daily.  This should help with your hot flashes and mood.  -You're doing a great job - adjusting to life with MS is difficult - I will also have our social worker reach out to you.  Please be sure to bring all of your medications with you to every visit.  Should you have any new or worsening symptoms, please be sure to call the clinic at [phone].

## 2011-09-19 NOTE — Progress Notes (Signed)
Subjective:   Patient ID: MINA BABULA female   DOB: [birth year] 51 y.o.   MRN: [id number]  HPI: Ms.Lameshia A Tilly is a 51 y.o. woman with history of multiple sclerosis hyperlipidemia and vasomotor symptoms (flushing) who presents today for routine followup after starting Paxil at last visit. She was last seen by me on 05/31/2011. Since then, she was seen by Dr. Anne Hahn of neurology, and started on fingolimod, of which she was given samples.  During interview, she becomes extremely teary-eyed. She is having a difficult time adjusting to life with multiple sclerosis. She is finding it difficult to adjust to the fact that she cannot do as much as she used to be able to do and she has not become more dependent on other people. She has a son and daughter who are supportive but do not the time to spend with her that she would like. Her son brought her to her doctor's appointment today, but is not in the room. Appetite is okay, but sleep is poor. Poor sleep is also compounded by the fact that her hot flashes are not controlled. She was started on Paxil at last visit, but she did not fill this medication. She denies feelings of guilt, but continues to focus on the fact that she cannot do what she once could. She does not want to see herself in a wheelchair, and is concerned that she is already using a cane at the age of 51 years old. She denies feelings of suicidal ideation, but hesitates when I ask. She notes decreased energy.  Past Medical History  Diagnosis Date  . MS (multiple sclerosis)   . Hyperlipidemia    Family History  Problem Relation Age of Onset  . Colon cancer Maternal Aunt    History   Social History  . Marital Status: Single    Spouse Name: N/A    Number of Children: N/A  . Years of Education: N/A   Social History Main Topics  . Smoking status: Never Smoker   . Smokeless tobacco: None  . Alcohol Use: No  . Drug Use: No  . Sexually Active: No   Other Topics Concern    . None   Social History Narrative  . None   Review of Systems: Constitutional: Denies fever, chills, diaphoresis HEENT: Denies photophobia, eye pain, redness, hearing loss, ear pain, congestion, sore throat, rhinorrhea, sneezing, mouth sores, trouble swallowing, neck pain, neck stiffness and tinnitus.   Respiratory: Denies SOB, DOE, cough, chest tightness,  and wheezing.   Cardiovascular: Denies chest pain, palpitations  Gastrointestinal: Denies nausea, vomiting, abdominal pain, diarrhea, constipation, blood in stool and abdominal distention.  Genitourinary: Denies dysuria, urgency, frequency, hematuria, flank pain and difficulty urinating.  concerned about sexual dysfunction Skin: Denies pallor, rash and wound.  Neurological: Denies dizziness, seizures, syncope, light-headedness, and headaches. + unsteady gait, occasional numbness in lower extremities  Hematological: Denies adenopathy. Easy bruising, personal or family bleeding history  Psychiatric/Behavioral: Denies suicidal ideation, confusion  Objective:  Physical Exam: Filed Vitals:   09/19/11 1420  BP: 134/85  Pulse: 72  Temp: 97.5 F (36.4 C)  TempSrc: Oral  Resp: 20  Height: 5' 1.5" (1.562 m)  Weight: 148 lb 1.6 oz (67.178 kg)  SpO2: 100%   Constitutional: Vital signs reviewed.  Patient is a well-developed and well-nourished woman in no acute distress and cooperative with exam. Mouth: no erythema or exudates, MMM Eyes: PERRL, EOMI, conjunctivae normal, No scleral icterus.  Cardiovascular: RRR, S1 normal, S2 normal, no  MRG, pulses symmetric and intact bilaterally Pulmonary/Chest: CTAB, no wheezes, rales, or rhonchi Abdominal: Soft. Non-tender, non-distended, bowel sounds are normal, no masses, organomegaly, or guarding present.  Musculoskeletal: No joint deformities, erythema, or stiffness, ROM full and nontender Neurological: A&O x3  Skin: Warm, dry and intact. No rash, cyanosis, or clubbing.  Psychiatric:  depressed  mood and affect.  Judgment and thought content normal. Cognition and memory are normal.   Assessment & Plan:   Case and care discussed with Dr. Lonzo Cloud.  Please see problem-oriented charting for further details. Patient to return in 2 months for followup regarding depression, vasomotor flushing, and sexual dysfunction.

## 2011-09-19 NOTE — Assessment & Plan Note (Signed)
Patient never started paroxetine 20 mg daily. We will re\re prescribe medication today. He will return in 2 months to followup. If this medication is not useful, we may consider restarting gabapentin as she reports this medication was helpful in the past.

## 2011-09-19 NOTE — Assessment & Plan Note (Addendum)
Related to difficulty with adjustment with life with MS - no SI. Depression commonly coexists with multiple sclerosis  -Patient never started Paxil 20 mg daily - reordered today.

## 2011-09-19 NOTE — Assessment & Plan Note (Signed)
Recently started on [place].  Followed by Dr. Anne Hahn of Neuro.  Having difficulty adjusting to life with MS.  Will refer to social work - possible support groups for patients with MS?

## 2011-09-26 ENCOUNTER — Telehealth: Payer: Self-pay | Admitting: Licensed Clinical Social Worker

## 2011-09-26 NOTE — Telephone Encounter (Signed)
Kristen Boyle was referred to CSW for referrals for support group information.  CSW has listing of MS support groups in [place].  CSW placed called to pt.  CSW left message requesting return call. CSW provided contact hours and phone number.

## 2011-10-03 ENCOUNTER — Encounter: Payer: Self-pay | Admitting: Licensed Clinical Social Worker

## 2011-10-03 NOTE — Telephone Encounter (Signed)
CSW spoke with [name] over the phone regarding referral to MS support groups.  Pt preferring for CSW to place support group information in the mail.  CSW will also provide walk-in hours to [facility] and [address] of the [place]. Pt denies add'l needs at this time.  CSW will sign off.

## 2011-10-10 ENCOUNTER — Ambulatory Visit (HOSPITAL_COMMUNITY): Payer: Medicaid Other

## 2012-04-15 ENCOUNTER — Ambulatory Visit (INDEPENDENT_AMBULATORY_CARE_PROVIDER_SITE_OTHER): Payer: Medicaid Other | Admitting: Internal Medicine

## 2012-04-15 ENCOUNTER — Encounter: Payer: Medicaid Other | Admitting: Internal Medicine

## 2012-04-15 ENCOUNTER — Other Ambulatory Visit (HOSPITAL_COMMUNITY): Admission: RE | Admit: 2012-04-15 | Payer: Medicaid Other | Source: Ambulatory Visit | Admitting: Internal Medicine

## 2012-04-15 ENCOUNTER — Encounter: Payer: Self-pay | Admitting: Internal Medicine

## 2012-04-15 ENCOUNTER — Other Ambulatory Visit (HOSPITAL_COMMUNITY)
Admission: RE | Admit: 2012-04-15 | Discharge: 2012-04-15 | Disposition: A | Discharge status: Home / Self Care | Payer: Medicaid Other | Source: Ambulatory Visit | Attending: Internal Medicine | Admitting: Internal Medicine

## 2012-04-15 VITALS — BP 133/89 | HR 65 | Temp 98.0°F | Ht 60.0 in | Wt 148.6 lb

## 2012-04-15 DIAGNOSIS — S80829A Blister (nonthermal), unspecified lower leg, initial encounter: Secondary | ICD-10-CM | POA: Insufficient documentation

## 2012-04-15 DIAGNOSIS — X58XXXA Exposure to other specified factors, initial encounter: Secondary | ICD-10-CM

## 2012-04-15 DIAGNOSIS — L259 Unspecified contact dermatitis, unspecified cause: Secondary | ICD-10-CM | POA: Insufficient documentation

## 2012-04-15 DIAGNOSIS — IMO0002 Reserved for concepts with insufficient information to code with codable children: Secondary | ICD-10-CM

## 2012-04-15 DIAGNOSIS — S80821A Blister (nonthermal), right lower leg, initial encounter: Secondary | ICD-10-CM

## 2012-04-15 LAB — CBC WITH DIFFERENTIAL/PLATELET
Basophils Relative: 0 % (ref 0–1)
Eosinophils Absolute: 0.1 10*3/uL (ref 0.0–0.7)
Eosinophils Relative: 2 % (ref 0–5)
HCT: 41.6 % (ref 36.0–46.0)
Hemoglobin: 13.7 g/dL (ref 12.0–15.0)
MCH: 25.2 pg — ABNORMAL LOW (ref 26.0–34.0)
MCHC: 32.9 g/dL (ref 30.0–36.0)
MCV: 76.6 fL — ABNORMAL LOW (ref 78.0–100.0)
Monocytes Absolute: 0.4 10*3/uL (ref 0.1–1.0)
Monocytes Relative: 12 % (ref 3–12)

## 2012-04-15 NOTE — Patient Instructions (Signed)
The biopsy done today has stitches that will need to be removed in 7-10 days.  Beginning tomorrow, remove the dressing, wash twice daily with soap and water, and then apply a thin film of antibiotic ointment such as Bacitracin or Polysporin. Band-Aids are optional. Call the office if you experience significant redness, pain, itching, swelling, or drainage of pus. If you are unable to keep your appointment for suture removal, call the office.  Tylenol or ice packs can be used for pain control.  I will call you when I find out the results of your lab tests and biopsy.

## 2012-04-15 NOTE — Progress Notes (Signed)
Patient ID: Kristen Boyle, female   DOB: 1960/11/15, 52 y.o.   MRN: [id number]  Subjective:   Patient ID: Kristen Boyle female   DOB: [birth year] 52 y.o.   MRN: [id number]  HPI: [name] is a 52 y.o. female with history of multiple sclerosis presenting to the clinic with complaints of blistering eruption on R lower leg x 4 days. She reports noticing several blisters develop on her R knee about 4 days ago. They have grown in size over the past 4 days. Three days ago she noticed another cropping of blisters on R mid calf. She also has a single blister on R mid shin. No drainage from vesicles. Areas are very pruritic but not painful. Denies working in grass, wearing shorts, new detergent, new clothing. Denies travel, new medications. Denies dizziness, HA, changes in vision, fever, chills, CP, SOB, abdominal pain, N/V/D, hematuria, dysuria, blood in stool.  Has never had eruption like this in the past.  Has chronic L eye blurred vision and LLE edema from MS. Taking fingolimod for MS since the fall. No other meds  Past Medical History  Diagnosis Date  . MS (multiple sclerosis)   . Hyperlipidemia    Current Outpatient Prescriptions  Medication Sig Dispense Refill  . Fingolimod HCl 0.5 MG CAPS Take 0.5 mg by mouth daily.       No current facility-administered medications for this visit.   Family History  Problem Relation Age of Onset  . Colon cancer Maternal Aunt    History   Social History  . Marital Status: Single    Spouse Name: N/A    Number of Children: N/A  . Years of Education: N/A   Social History Main Topics  . Smoking status: Never Smoker   . Smokeless tobacco: None  . Alcohol Use: No  . Drug Use: No  . Sexually Active: No   Other Topics Concern  . None   Social History Narrative  . None   Review of Systems: 10 pt ROS performed, pertinent positives and negatives noted in HPI Objective:  Physical Exam: Filed Vitals:   04/15/12 1554  BP:  133/89  Pulse: 65  Temp: 98 F (36.7 C)  TempSrc: Oral  Height: 5' (1.524 m)  Weight: 148 lb 9.6 oz (67.405 kg)  SpO2: 100%   Vitals reviewed. General: sitting in chair, NAD HEENT: Decreased pupillary constriction L eye, EOMI, no scleral icterus Cardiac: RRR, no rubs, murmurs or gallops Pulm: clear to auscultation bilaterally, no wheezes, rales, or rhonchi Abd: soft, nontender, nondistended, BS present Ext: pedal and calf edema of L lower leg (chronic from MS) Skin: Clusters of 7-10 multiple, 1cm flesh colored to slightly pink vesicles lateral to R patella and on R mid calf. Solitary vesicle on R mid shin. Not painful to palpation. Tense lesions with no drainage. No crusting or evidence of superinfection.  No lesions on trunk, other extremities, palms, soles, or mucous membranes of mouth/eyes.     PUNCH BIOPSY PROCEDURE NOTE A possible diagnosis of dermatitis herpetiformis vs IgA bullous dermopathy vs HSV/VZV was discussed with the patient and the need for punch reviewed. Consent for the procedure was obtained. The skin of the right mid calf was prepped with betadine, and local anesthesia was obtained with 1 percent lidocaine with epinephrine. Punch of 4 mm was obtained from blister edge on the R mid calf to the depth of the subcutaneous fat, and  was closed with a 4-O ethilon suture. The specimen from t  was placed in Michel's solution and submitted to pathology. Band-Aid dressings were applied. Wound care instructions were given. The patient will return in 7-10 days for suture removal and review of pathology results.   Assessment & Plan:  Please see problem-based charting for assessment and plan.

## 2012-04-15 NOTE — Assessment & Plan Note (Addendum)
Patient with 2 distinct clusters of vesicles in 2 separate dermatomes. She is on medication for MS that has SE of immunosuppression, but do not suspect disseminated HSV/VZV as cause of blisters as she has no pain or other systemic symptoms. She has no exposures to suggest irritant or allergic contact reaction. No new medications.  She does have autoimmune dz (MS), and may be at risk for other AI phenomenon. Appearance of vesicles could be consistent w dermatitis herpetiformis (intensely puritic, on extensor surface (knee)) or IgA dermopathy. She cannot get in to see a local dermatologist with Medicaid insurance. Options reviewed w patient and she elected to proceed w punch biopsy in our clinic. - Biopsy sent for DIF - Checked CBC, ESR - Pt to RTC in 7-10 days for suture removal, discussion of bx results.  - Warning s/s of systemic/disseminated infection discussed, told to call us or go to ER if having these symptoms. - Wound care education provided.    ADDENDUM Lab Results  Component Value Date   WBC 3.4* 04/15/2012   HGB 13.7 04/15/2012   HCT 41.6 04/15/2012   MCV 76.6* 04/15/2012   PLT 323 04/15/2012   Patient at low end of normal for WBC and ANC is 1.6. Not significantly neutropenic. Will follow at next visit. Biopsy results still pending.    ADDENDUM 04/17/2012 Dermpath returned, direct immunofluorescence negative, making DH and IgA disease less likely. Possibly changes c/w viral cytopathic effect. Tried to call pt. No answer, no VM available. Will try again Monday. Has appt 04/23/12 w Dr. Sherrine Maples

## 2012-04-23 ENCOUNTER — Ambulatory Visit (INDEPENDENT_AMBULATORY_CARE_PROVIDER_SITE_OTHER): Payer: Medicaid Other | Admitting: Internal Medicine

## 2012-04-23 ENCOUNTER — Encounter: Payer: Self-pay | Admitting: Internal Medicine

## 2012-04-23 VITALS — BP 126/83 | HR 73 | Temp 97.1°F | Ht 60.0 in | Wt 149.6 lb

## 2012-04-23 DIAGNOSIS — G35 Multiple sclerosis: Secondary | ICD-10-CM

## 2012-04-23 DIAGNOSIS — S80829D Blister (nonthermal), unspecified lower leg, subsequent encounter: Secondary | ICD-10-CM

## 2012-04-23 NOTE — Patient Instructions (Addendum)
Ok to shower, let the soapy water run over the blister sites. Do not scrub.   Please call your Neurologist and ask about restarting your medication for the Multiple Sclerosis as soon as possible. Please notify the Neurologist that you recently experienced this viral illness with the blistering. If it is ok with your Neurologist, you should receive  The Zostavax to help prevent further outbreaks.

## 2012-04-23 NOTE — Progress Notes (Signed)
Patient ID: Kristen Boyle, female   DOB: 1960/10/13, 52 y.o.   MRN: [id number]  Subjective:   Patient ID: Kristen Boyle female   DOB: 09-19-1960 52 y.o.   MRN: [id number]  HPI: Kristen Boyle is a 52 y.o. F with PMH MS who presents to [facility] for f/u after being seen for a blistering rash on her lower extremities and for suture removal.   At her last clinic visit, a punch biopsy was performed on one of the blisters on the posterior of her right leg. She states that initially the site was burning, but this improved a few days later. She denies any current pain, and but does endorse a burning sensation from the blisters initially that has resolved. Pathology returned from the punch biopsy, and her blisters seem to be more viral related than autoimmune, possibly shingles. She has not taken her MS medication since the blisters began of her on volition, for about 2 weeks, and is to call her Neurologist to restart the medication.    Past Medical History  Diagnosis Date  . MS (multiple sclerosis)   . Hyperlipidemia    Current Outpatient Prescriptions  Medication Sig Dispense Refill  . Fingolimod HCl 0.5 MG CAPS Take 0.5 mg by mouth daily.       No current facility-administered medications for this visit.   Family History  Problem Relation Age of Onset  . Colon cancer Maternal Aunt    History   Social History  . Marital Status: Single    Spouse Name: N/A    Number of Children: N/A  . Years of Education: N/A   Social History Main Topics  . Smoking status: Never Smoker   . Smokeless tobacco: None  . Alcohol Use: No  . Drug Use: No  . Sexually Active: No   Other Topics Concern  . None   Social History Narrative  . None   Review of Systems: Constitutional: Denies fever, chills, diaphoresis, appetite change and fatigue.  HEENT: Denies photophobia, eye pain, redness, hearing loss, ear pain, congestion, sore throat, rhinorrhea, sneezing, mouth sores, trouble swallowing,  neck pain, neck stiffness and tinnitus.   Respiratory: Denies SOB, DOE, cough, chest tightness,  and wheezing.   Cardiovascular: Denies chest pain, palpitations and leg swelling.  Gastrointestinal: Denies nausea, vomiting, abdominal pain, diarrhea, constipation, blood in stool and abdominal distention.  Genitourinary: Denies dysuria, urgency, frequency, hematuria, flank pain and difficulty urinating.  Musculoskeletal: Denies myalgias, back pain, joint swelling, arthralgias and gait problem.  Skin: Denies pallor, rash and wound.  Neurological: +  Dizziness off MS medication. Denies seizures, syncope, weakness, light-headedness, numbness and headaches.  Hematological: Denies adenopathy. Easy bruising, personal or family bleeding history  Psychiatric/Behavioral: Denies suicidal ideation, mood changes, confusion, nervousness, sleep disturbance and agitation  Objective:  Physical Exam: Filed Vitals:   04/23/12 1439  BP: 126/83  Pulse: 73  Temp: 97.1 F (36.2 C)  TempSrc: Oral  Height: 5' (1.524 m)  Weight: 149 lb 9.6 oz (67.858 kg)  SpO2: 100%   Constitutional: Vital signs reviewed.  Patient is a well-developed and well-nourished female in no acute distress and cooperative with exam. Alert and oriented x3.  Head: Normocephalic and atraumatic Eyes: PERRL, EOMI Neck: Supple, normal ROM Cardiovascular: RRR Pulmonary/Chest: CTAB, no wheezes, rales, or rhonchi Abdominal: Soft. Non-tender, non-distended Musculoskeletal: No joint deformities, erythema, or stiffness, ROM full and no nontender Neurological: A&O x3, nonfocal.  Skin: Well healing former blisters on right lateral knee, with one small 1-39mm  diameter blister. Posterior right leg with large flaccid blister, suture removed. Psychiatric: Normal mood and affect.   Assessment & Plan:   Please refer to Problem List based Assessment and Plan

## 2012-04-25 NOTE — Assessment & Plan Note (Addendum)
She has been off of her Fingolimod for 2 weeks, per pt. She was advised to call her Neurologist ASAP to restart this medicine, b/c after 2 weeks, to restart the medication, she has to receive the initial dose at the Neurologist's office.

## 2012-04-25 NOTE — Assessment & Plan Note (Addendum)
Per pathology report, blisters likely viral, possibly shingles and not autoimmune related. They are beginning to dry up and are no longer bothersome to the patient. The Zostavax was recommended for the patient.

## 2012-04-27 NOTE — Progress Notes (Signed)
I saw and evaluated the patient.  I personally confirmed the key portions of Dr. Gordy Councilman history and exam and reviewed pertinent patient test results.  The assessment, diagnosis, and plan were formulated together and I agree with the documentation in the resident's note.  The blistering rash is now crusting over compared to last week when I examined [name].  Biopsy consistent with a viral process.  Given original lesions and distribution we favor shingles.

## 2012-06-10 ENCOUNTER — Encounter: Payer: Self-pay | Admitting: Neurology

## 2012-06-10 ENCOUNTER — Ambulatory Visit (INDEPENDENT_AMBULATORY_CARE_PROVIDER_SITE_OTHER): Payer: Medicaid Other | Admitting: Neurology

## 2012-06-10 VITALS — BP 126/83 | HR 83 | Ht 60.0 in | Wt 147.0 lb

## 2012-06-10 DIAGNOSIS — R269 Unspecified abnormalities of gait and mobility: Secondary | ICD-10-CM | POA: Insufficient documentation

## 2012-06-10 DIAGNOSIS — Z5181 Encounter for therapeutic drug level monitoring: Secondary | ICD-10-CM

## 2012-06-10 DIAGNOSIS — G35 Multiple sclerosis: Secondary | ICD-10-CM

## 2012-06-10 HISTORY — DX: Unspecified abnormalities of gait and mobility: R26.9

## 2012-06-10 NOTE — Progress Notes (Signed)
Reason for visit: Multiple sclerosis  Kristen Boyle is an 52 y.o. female  History of present illness:  Kristen Boyle she is a 52 year old right-handed black female with a history of multiple sclerosis. The patient had been on Gilenya, but she stopped the medication a month ago as she developed a pustular rash on the right knee and right lower leg. The rash is improving at this point. The patient has had a gradual change in her ability to ambulate, and she continues to have issues with bladder control, and constipation. The patient has to use the bathroom on a scheduled basis, as she cannot feel when she needs to use the bathroom. The patient uses a cane for ambulation, and she denies any recent falls. Overall, the patient has a lot of fatigue that impairs her ability to maintain physical activity. The patient had some changes in her vision, and she is having problems seeing small print. The patient returns to this office for an evaluation.  Past Medical History  Diagnosis Date  . MS (multiple sclerosis)   . Hyperlipidemia   . Dyslipidemia   . Depression   . Gait disorder   . Abnormality of gait 06/10/2012    Past Surgical History  Procedure Laterality Date  . Tubal ligation Bilateral     Family History  Problem Relation Age of Onset  . Colon cancer Maternal Aunt   . Diabetes Paternal Aunt   . Dementia Paternal Grandmother     Senile dementia of Alzheimer's type  . Colon cancer Cousin     Social history:  reports that she has never smoked. She does not have any smokeless tobacco history on file. She reports that she does not drink alcohol or use illicit drugs.  Allergies: No Known Allergies  Medications:  Current Outpatient Prescriptions on File Prior to Visit  Medication Sig Dispense Refill  . Fingolimod HCl 0.5 MG CAPS Take 0.5 mg by mouth daily.       No current facility-administered medications on file prior to visit.    ROS:  Out of a complete 14 system review  of symptoms, the patient complains only of the following symptoms, and all other reviewed systems are negative.  Fatigue Swelling in the legs Hearing loss Skin rash, itching Blurred vision, loss of vision Shortness of breath, snoring Urination problems Feeling hot, cold, thirsty Joint pain, joint swelling Skin sensitivity Memory loss, confusion, headache, numbness, weakness, slurred speech, dizziness Depression, anxiety, insomnia  Blood pressure 126/83, pulse 83, height 5' (1.524 m), weight 147 lb (66.679 kg).  Physical Exam  General: The patient is alert and cooperative at the time of the examination. Affect is somewhat flat.  Skin: No significant peripheral edema is noted.   Neurologic Exam  Cranial nerves: Facial symmetry is present. Speech is normal, no aphasia or dysarthria is noted. Extraocular movements are full. Visual fields are full. Pupils are equal, round, and reactive to light. Discs are flat bilaterally. Discs appear to be slightly atrophic bilaterally.  Motor: The patient has good strength in all 4 extremities, with the exception that there is 4/5 strength proximally in both legs, left is slightly weaker.  Coordination: The patient has good finger-nose-finger and heel-to-shin bilaterally.  Gait and station: The patient has a slightly wide-based, unsteady gait. Tandem gait is unsteady. Romberg is negative. No drift is seen.  Reflexes: Deep tendon reflexes are symmetric.   Assessment/Plan:  One. Multiple sclerosis  2. Gait disorder  The patient developed a rash on the  right leg that could have represented a shingles outbreak. The patient went off of Gilenya, and she has been off of this medication for one month. The patient will need a monitored dosing once again through cardiology. This will be set up. Blood work will be checked today. The patient followup in 4 months. I am concerned that the patient may be developing a progressive course of multiple sclerosis  at this time. The patient will be sent for MRI evaluation of the brain.  Marlan Palau MD 06/10/2012 7:23 PM  [facility] [address] [place], [place] [address]  Phone [phone] Fax [phone]

## 2012-06-11 ENCOUNTER — Telehealth: Payer: Self-pay | Admitting: Neurology

## 2012-06-11 LAB — CBC WITH DIFFERENTIAL
Basophils Absolute: 0 10*3/uL (ref 0.0–0.2)
Eosinophils Absolute: 0.2 10*3/uL (ref 0.0–0.4)
HCT: 40.6 % (ref 34.0–46.6)
Lymphs: 63 % — ABNORMAL HIGH (ref 14–46)
MCH: 24.9 pg — ABNORMAL LOW (ref 26.6–33.0)
MCHC: 32.5 g/dL (ref 31.5–35.7)
Monocytes Absolute: 0.4 10*3/uL (ref 0.1–0.9)
Neutrophils Absolute: 0.9 10*3/uL — ABNORMAL LOW (ref 1.4–7.0)
RDW: 16 % — ABNORMAL HIGH (ref 12.3–15.4)

## 2012-06-11 LAB — COMPREHENSIVE METABOLIC PANEL
ALT: 105 IU/L — ABNORMAL HIGH (ref 0–32)
AST: 57 IU/L — ABNORMAL HIGH (ref 0–40)
Albumin/Globulin Ratio: 1.4 (ref 1.1–2.5)
GFR calc Af Amer: 82 mL/min/{1.73_m2} (ref >59)
GFR calc non Af Amer: 71 mL/min/{1.73_m2} (ref >59)
Globulin, Total: 3.4 g/dL (ref 1.5–4.5)
Potassium: 4.6 mmol/L (ref 3.5–5.2)
Sodium: 141 mmol/L (ref 134–144)
Total Bilirubin: 0.2 mg/dL (ref 0.0–1.2)

## 2012-06-11 NOTE — Telephone Encounter (Signed)
I called patient. The herpes zoster advisor adequate. The liver enzymes are elevated. One year ago, the enzymes were normal, 2 years ago, they were also elevated. The etiology of this is not clear. The patient will have the enzymes  rechecked in 3 weeks. If the enzymes remain elevated, and further evaluation for this needs to be undertaken. The patient is not to restart Gilenya until this issue has resolved.

## 2012-06-20 ENCOUNTER — Ambulatory Visit
Admission: RE | Admit: 2012-06-20 | Discharge: 2012-06-20 | Disposition: A | Discharge status: Home / Self Care | Payer: Medicaid Other | Source: Ambulatory Visit | Attending: Neurology | Admitting: Neurology

## 2012-06-20 DIAGNOSIS — Z5181 Encounter for therapeutic drug level monitoring: Secondary | ICD-10-CM

## 2012-06-20 DIAGNOSIS — G35 Multiple sclerosis: Secondary | ICD-10-CM

## 2012-06-20 MED ORDER — GADOBENATE DIMEGLUMINE 529 MG/ML IV SOLN
14.0000 mL | Freq: Once | INTRAVENOUS | Status: AC | PRN
  Administered 2012-06-20: 14 mL via INTRAVENOUS

## 2012-06-22 ENCOUNTER — Telehealth: Payer: Self-pay | Admitting: Neurology

## 2012-06-22 NOTE — Telephone Encounter (Signed)
I called patient. The MRI the brain showed extensive changes consistent with multiple sclerosis. No enhancing lesion seen. The patient is off of Gilenya, and she has been office for about 2 months. The patient was discovered to have elevated liver enzymes, and this will be rechecked next week. She may need further medical evaluation if they remain elevated.

## 2012-07-03 ENCOUNTER — Telehealth: Payer: Self-pay | Admitting: Neurology

## 2012-07-03 DIAGNOSIS — R748 Abnormal levels of other serum enzymes: Secondary | ICD-10-CM

## 2012-07-03 NOTE — Telephone Encounter (Signed)
Message copied by Stephanie Acre on Fri Jul 03, 2012  7:57 AM ------      Message from: Stephanie Acre      Created: Thu Jun 11, 2012 10:16 AM       Recheck liver enzymes, hepatitis screen, Epstein-Barr virus antibody. ------

## 2012-07-03 NOTE — Telephone Encounter (Signed)
I called the patient at [phone]. I left a message. The patient is to come in to get blood work drawn to recheck liver enzymes. If the enzymes remain elevated, the Gilenya may need to be discontinued.

## 2012-07-31 ENCOUNTER — Encounter: Payer: Self-pay | Admitting: Internal Medicine

## 2012-07-31 ENCOUNTER — Ambulatory Visit (INDEPENDENT_AMBULATORY_CARE_PROVIDER_SITE_OTHER): Payer: Medicaid Other | Admitting: Internal Medicine

## 2012-07-31 VITALS — BP 126/85 | HR 90 | Temp 97.1°F | Ht 60.0 in | Wt 153.1 lb

## 2012-07-31 DIAGNOSIS — Z1239 Encounter for other screening for malignant neoplasm of breast: Secondary | ICD-10-CM

## 2012-07-31 DIAGNOSIS — R748 Abnormal levels of other serum enzymes: Secondary | ICD-10-CM

## 2012-07-31 DIAGNOSIS — G35 Multiple sclerosis: Secondary | ICD-10-CM

## 2012-07-31 DIAGNOSIS — R7989 Other specified abnormal findings of blood chemistry: Secondary | ICD-10-CM

## 2012-07-31 MED ORDER — IBUPROFEN 200 MG PO TABS: 600.0000 mg | ORAL_TABLET | Freq: Four times a day (QID) | ORAL | Status: DC | PRN

## 2012-07-31 NOTE — Assessment & Plan Note (Addendum)
Assessment: Pt would like to have mammogram since past due (2010).    Plan: -Referral for breast mammogram

## 2012-07-31 NOTE — Progress Notes (Signed)
Subjective:   Patient ID: Kristen Boyle female   DOB: 13-Apr-1960 52 y.o.   MRN: [id number]  HPI: [name] is a 52 y.o. AA female with pmh of HL, and MS with deficits in vision, balance, gait, and bladder control, not currently not on medication who presents for routine follow-up visit. She reports no recent MS flare-ups or falls. Last flare-up was over a year ago. She had a recent MRI of the brain which she reports did not show any new changes. She has continued pain in her left leg that has been ongoing for a year and half. She describes the pain occuring in her left knee cap and left groin area and is worse after walking around. She uses a cane to ambulate and has dizziness on standing. She feels off balance when walking and feels that her left leg starts to drag after taking a number of steps. She has stiffness in her left leg and recently noticed sporadic jerking of it.  She has been off of her MS medication Glilenya since about mid-March due to ulcers on her right leg from viral infection that have now resolved. She believes she first started the medication more than a year or so ago. Due to abnormal liver function tests, her neurologist told her to not continue taking Gilenya. Before that she was on Betaseron but was noncompliant due to problems with finding someone to administer it (injections).           Her vision in her left eye seems to be getting worse with difficulty seeing far away and darker colors. No eye pain or double vision. She has chronic constipation due to MS. She has never been on a stool softener in the past but reports that flax seed works well for her. In the last month she has had incontinent bowels. Also with persistent urinary incontinence, not currently on any medications for it. She has dysphagia to solids more than liquids with the feeling of food being stuck in lower part of her throat with resulting chest discomfort. No symptoms of acid reflux. She  has intermittent cramping 5/10 upper abdominal pain that is worse when she comes out of the shower.         MS symptoms worsen with heat and exercise. She also feels fatigued, depressed, and is under a lot of stress. She has night sweats and vaginal dryness (does not want vaginal lubricant or moisturizer).  No palpitations. She feels she has gained weight and is interested in following a diet since she is limited in terms of exercise with MS.        She reports having a colonoscopy earlier this year that was normal. She is due for mammogram and is interested in having one done.    Past Medical History  Diagnosis Date  . MS (multiple sclerosis)   . Hyperlipidemia   . Dyslipidemia   . Depression   . Gait disorder   . Abnormality of gait 06/10/2012   Current Outpatient Prescriptions  Medication Sig Dispense Refill  . Fingolimod HCl 0.5 MG CAPS Take 0.5 mg by mouth daily.      Marland Kitchen ibuprofen (ADVIL,MOTRIN) 200 MG tablet Take 200 mg by mouth every 6 (six) hours as needed for pain.       No current facility-administered medications for this visit.   Family History  Problem Relation Age of Onset  . Colon cancer Maternal Aunt   . Diabetes Paternal Aunt   . Dementia  Paternal Grandmother     Senile dementia of Alzheimer's type  . Colon cancer Cousin    History   Social History  . Marital Status: Single    Spouse Name: N/A    Number of Children: 4  . Years of Education: 2-College   Social History Main Topics  . Smoking status: Never Smoker   . Smokeless tobacco: Not on file  . Alcohol Use: No  . Drug Use: No  . Sexually Active: No   Other Topics Concern  . Not on file   Social History Narrative  . No narrative on file   Review of Systems: Review of Systems  Review of Systems  Constitutional: Positive for malaise/fatigue. Negative for fever, chills, weight loss and diaphoresis.       Weight gain   HENT: Negative for hearing loss, congestion and sore throat.   Eyes: Positive  for blurred vision (left eye). Negative for double vision and pain.  Respiratory: Positive for cough (night time ).   Cardiovascular: Negative for chest pain and palpitations.  Gastrointestinal: Positive for abdominal pain (diffuse upper abdominal) and constipation. Negative for heartburn, nausea, vomiting and diarrhea.       Bowel incontinence   Genitourinary:       Urinary incontinence   Musculoskeletal: Negative for falls.       Left knee pain Left groin pain   Skin: Negative for itching.  Neurological: Positive for dizziness, tremors (with action), speech change (slow speech) and headaches (tension).       Jerking of left leg stiffness of left leg Balance problems   Psychiatric/Behavioral: Positive for depression.     Objective:  Physical Exam: Filed Vitals:   07/31/12 1339  BP: 126/85  Pulse: 90  Temp: 97.1 F (36.2 C)  TempSrc: Oral  Height: 5' (1.524 m)  Weight: 153 lb 1.6 oz (69.446 kg)  SpO2: 99%    Physical Exam  Constitutional: She is oriented to person, place, and time. She appears well-developed and well-nourished. No distress.  HENT:  Head: Normocephalic and atraumatic.  Mouth/Throat: Oropharynx is clear and moist.  Eyes: Conjunctivae and EOM are normal. Pupils are equal, round, and reactive to light.  Neck: Normal range of motion. Neck supple.  Cardiovascular: Normal rate, regular rhythm and normal heart sounds.   Pulmonary/Chest: Effort normal and breath sounds normal. No respiratory distress. She has no wheezes. She has no rales. She exhibits no tenderness.  Abdominal: Soft. Bowel sounds are normal. She exhibits no distension. There is tenderness (RUQ with palpation, Murphy's sign negative ). There is no rebound and no guarding.  Musculoskeletal:  No swelling left leg or tenderness of palpation of left knee Limited ROM of left> right LE  Neurological: She is alert and oriented to person, place, and time. She displays abnormal reflex (hyperreflexic  ).    Slow speech Negative drift Decreased lower extremity strength (3-4/5), L<R Cannot lift left leg  Normal sensation Gait slow and small steps, difficulty picking up left foot     Skin: Skin is warm and dry. She is not diaphoretic.  Healing ulcers on right thigh and leg   Psychiatric:  Depressed mood    Assessment & Plan:   Please see problem list for problem-based assessment and plan

## 2012-07-31 NOTE — Patient Instructions (Addendum)
-Please obtain blood work today, fasting lipid profile next time   -We will call about the lab results -Please follow-up with Dr. Anne Hahn as scheduled  -Please read the following calorie counting diet for weight loss  -To schedule appointment with Norm Parcel for diet counseling  -To schedule appointment for mammography        Calorie Counting Diet A calorie counting diet requires you to eat the number of calories that are right for you in a day. Calories are the measurement of how much energy you get from the food you eat. Eating the right amount of calories is important for staying at a healthy weight. If you eat too many calories, your body will store them as fat and you may gain weight. If you eat too few calories, you may lose weight. Counting the number of calories you eat during a day will help you know if you are eating the right amount. A Registered Dietitian can determine how many calories you need in a day. The amount of calories needed varies from person to person. If your goal is to lose weight, you will need to eat fewer calories. Losing weight can benefit you if you are overweight or have health problems such as heart disease, high blood pressure, or diabetes. If your goal is to gain weight, you will need to eat more calories. Gaining weight may be necessary if you have a certain health problem that causes your body to need more energy. TIPS Whether you are increasing or decreasing the number of calories you eat during a day, it may be hard to get used to changes in what you eat and drink. The following are tips to help you keep track of the number of calories you eat.  Measure foods at home with measuring cups. This helps you know the amount of food and number of calories you are eating.  Restaurants often serve food in amounts that are larger than 1 serving. While eating out, estimate how many servings of a food you are given. For example, a serving of cooked rice is  cup or  about the size of half of a fist. Knowing serving sizes will help you be aware of how much food you are eating at restaurants.  Ask for smaller portion sizes or child-size portions at restaurants.  Plan to eat half of a meal at a restaurant. Take the rest home or share the other half with a friend.  Read the Nutrition Facts panel on food labels for calorie content and serving size. You can find out how many servings are in a package, the size of a serving, and the number of calories each serving has.  For example, a package might contain 3 cookies. The Nutrition Facts panel on that package says that 1 serving is 1 cookie. Below that, it will say there are 3 servings in the container. The calories section of the Nutrition Facts label says there are 90 calories. This means there are 90 calories in 1 cookie (1 serving). If you eat 1 cookie you have eaten 90 calories. If you eat all 3 cookies, you have eaten 270 calories (3 servings x 90 calories = 270 calories). The list below tells you how big or small some common portion sizes are.  1 oz.........4 stacked dice.  3 oz........Marland KitchenDeck of cards.  1 tsp.......Marland KitchenTip of little finger.  1 tbs......Marland KitchenMarland KitchenThumb.  2 tbs.......Marland KitchenGolf ball.   cup......Marland KitchenHalf of a fist.  1 cup.......Marland KitchenA fist. KEEP A FOOD LOG Write  down every food item you eat, the amount you eat, and the number of calories in each food you eat during the day. At the end of the day, you can add up the total number of calories you have eaten. It may help to keep a list like the one below. Find out the calorie information by reading the Nutrition Facts panel on food labels. Breakfast  Bran cereal (1 cup, 110 calories).  Fat-free milk ( cup, 45 calories). Snack  Apple (1 medium, 80 calories). Lunch  Spinach (1 cup, 20 calories).  Tomato ( medium, 20 calories).  Chicken breast strips (3 oz, 165 calories).  Shredded cheddar cheese ( cup, 110 calories).  Light [place] dressing (2  tbs, 60 calories).  Whole-wheat bread (1 slice, 80 calories).  Tub margarine (1 tsp, 35 calories).  Vegetable soup (1 cup, 160 calories). Dinner  Pork chop (3 oz, 190 calories).  Brown rice (1 cup, 215 calories).  Steamed broccoli ( cup, 20 calories).  Strawberries (1  cup, 65 calories).  Whipped cream (1 tbs, 50 calories). Daily Calorie Total: 1425 Document Released: 01/07/2005 Document Revised: 04/01/2011 Document Reviewed: 07/04/2006 [facility] Patient Information 2014 [place], [place].

## 2012-08-01 LAB — EPSTEIN-BARR VIRUS VCA ANTIBODY PANEL
EBV EA IgG: 116 U/mL — ABNORMAL HIGH (ref <9.0)
EBV NA IgG: >600 U/mL — ABNORMAL HIGH (ref <18.0)
EBV VCA IgG: 193 U/mL — ABNORMAL HIGH (ref <18.0)

## 2012-08-01 LAB — COMPLETE METABOLIC PANEL WITH GFR
ALT: 88 U/L — ABNORMAL HIGH (ref 0–35)
Albumin: 4.3 g/dL (ref 3.5–5.2)
CO2: 27 mEq/L (ref 19–32)
GFR, Est African American: 74 mL/min
GFR, Est Non African American: 64 mL/min
Glucose, Bld: 96 mg/dL (ref 70–99)
Potassium: 4.2 mEq/L (ref 3.5–5.3)
Sodium: 141 mEq/L (ref 135–145)
Total Bilirubin: 0.3 mg/dL (ref 0.3–1.2)
Total Protein: 7.9 g/dL (ref 6.0–8.3)

## 2012-08-01 LAB — HEPATITIS C ANTIBODY: HCV Ab: NEGATIVE

## 2012-08-01 NOTE — Assessment & Plan Note (Signed)
Assessment: MS is fairly controlled, recent MRI with no enhancing lesions and  no recent flare-ups, but recent incontinence of bowels and jerking of left leg. Continued left leg pain, worse in knee and groin area for 1 and half years. Off of Gilenya for approx 4 months, and now awaiting labs to look for resolution of elevated liver enzymes before neurology will restart.        Plan: -Management of Gilenya per neurology -Presrcibe ibuprofen 600 mg for left leg pain---> will consider switching to tramadol

## 2012-08-01 NOTE — Assessment & Plan Note (Addendum)
Assessment: Elevated and slowly uptrending ALT, AST, and Alk Phosph for close to 1 year, started Gilenya around same time. If thought to be due to drug-induced hepatitis will see decline in values since stopped using Gilenya mid-Mach. Viral hepatitis and NASH (h/o of elevated LDH and cholesterol and obesity) also possible. Not alcohol user. Per neurology not to be restarted on Gilenya until resolution of elevated enzymes.       Plan: -Obtain CMP -Obtain CBC w/ diff -Obtain Hep A antibody, Hep B surface antigen, Hep C antibody, and EBV  -Obtain fasting lipid panel  -To make appointment with Norm Parcel for weight counseling -To include calorie-counting diet in discharge papers

## 2012-08-02 NOTE — Progress Notes (Signed)
I saw and evaluated the patient.  I personally confirmed the key portions of the history and exam documented by Dr. Rabbani and I reviewed pertinent patient test results.  The assessment, diagnosis, and plan were formulated together and I agree with the documentation in the resident's note.  

## 2012-08-03 ENCOUNTER — Telehealth: Payer: Self-pay | Admitting: Internal Medicine

## 2012-08-03 NOTE — Telephone Encounter (Signed)
Pt was informed that her her liver enzymes have improved since 1 month ago and that her hepatitis panel was negative.

## 2012-08-10 ENCOUNTER — Telehealth: Payer: Self-pay | Admitting: Neurology

## 2012-08-10 NOTE — Telephone Encounter (Signed)
I called the patient. The blood work done shows ongoing elevation of the liver enzymes, but blood work done in 2012 also showed similar enzyme elevations. Other blood work that includes a hepatitis panel and an Epstein-Barr rigors panel were unremarkable. The patient has a history of a past infection of Epstein-Barr virus. The patient is to remain on Gilenya, we will follow the liver enzymes.

## 2012-08-13 ENCOUNTER — Ambulatory Visit (HOSPITAL_COMMUNITY): Payer: Medicaid Other | Attending: Internal Medicine

## 2012-09-09 ENCOUNTER — Ambulatory Visit: Payer: Medicaid Other | Admitting: Dietician

## 2012-09-09 ENCOUNTER — Encounter: Payer: Self-pay | Admitting: Internal Medicine

## 2012-09-09 NOTE — Addendum Note (Signed)
Addended by: Neomia Dear on: 09/09/2012 05:51 PM   Modules accepted: Orders

## 2013-04-21 ENCOUNTER — Ambulatory Visit: Payer: Medicaid Other | Admitting: Internal Medicine

## 2013-09-06 ENCOUNTER — Ambulatory Visit: Payer: Medicaid Other | Admitting: Internal Medicine

## 2013-09-09 ENCOUNTER — Ambulatory Visit (INDEPENDENT_AMBULATORY_CARE_PROVIDER_SITE_OTHER): Payer: Medicare Other | Admitting: Internal Medicine

## 2013-09-09 ENCOUNTER — Encounter: Payer: Self-pay | Admitting: Internal Medicine

## 2013-09-09 VITALS — BP 136/84 | HR 63 | Temp 98.9°F | Ht 60.0 in | Wt 150.4 lb

## 2013-09-09 DIAGNOSIS — N951 Menopausal and female climacteric states: Secondary | ICD-10-CM

## 2013-09-09 DIAGNOSIS — G35 Multiple sclerosis: Secondary | ICD-10-CM | POA: Diagnosis not present

## 2013-09-09 DIAGNOSIS — R748 Abnormal levels of other serum enzymes: Secondary | ICD-10-CM

## 2013-09-09 DIAGNOSIS — R6884 Jaw pain: Secondary | ICD-10-CM

## 2013-09-09 DIAGNOSIS — Z0000 Encounter for general adult medical examination without abnormal findings: Secondary | ICD-10-CM

## 2013-09-09 DIAGNOSIS — E785 Hyperlipidemia, unspecified: Secondary | ICD-10-CM

## 2013-09-09 DIAGNOSIS — Z780 Asymptomatic menopausal state: Secondary | ICD-10-CM

## 2013-09-09 DIAGNOSIS — G35D Multiple sclerosis, unspecified: Secondary | ICD-10-CM | POA: Diagnosis not present

## 2013-09-09 LAB — COMPLETE METABOLIC PANEL WITH GFR
ALT: 41 U/L — ABNORMAL HIGH (ref 0–35)
AST: 30 U/L (ref 0–37)
Albumin: 4.5 g/dL (ref 3.5–5.2)
Alkaline Phosphatase: 91 U/L (ref 39–117)
BILIRUBIN TOTAL: 0.3 mg/dL (ref 0.2–1.2)
BUN: 19 mg/dL (ref 6–23)
CHLORIDE: 105 meq/L (ref 96–112)
CO2: 25 mEq/L (ref 19–32)
CREATININE: 0.89 mg/dL (ref 0.50–1.10)
Calcium: 9.6 mg/dL (ref 8.4–10.5)
GFR, EST AFRICAN AMERICAN: 84 mL/min
GFR, Est Non African American: 73 mL/min
GLUCOSE: 88 mg/dL (ref 70–99)
Potassium: 4.3 mEq/L (ref 3.5–5.3)
Sodium: 140 mEq/L (ref 135–145)
Total Protein: 8 g/dL (ref 6.0–8.3)

## 2013-09-09 LAB — CBC
HCT: 37.6 % (ref 36.0–46.0)
Hemoglobin: 12.1 g/dL (ref 12.0–15.0)
MCH: 24.3 pg — AB (ref 26.0–34.0)
MCHC: 32.2 g/dL (ref 30.0–36.0)
MCV: 75.5 fL — ABNORMAL LOW (ref 78.0–100.0)
PLATELETS: 266 10*3/uL (ref 150–400)
RBC: 4.98 MIL/uL (ref 3.87–5.11)
RDW: 16.3 % — ABNORMAL HIGH (ref 11.5–15.5)
WBC: 4.4 10*3/uL (ref 4.0–10.5)

## 2013-09-09 LAB — LIPID PANEL
CHOL/HDL RATIO: 3.8 ratio
CHOLESTEROL: 256 mg/dL — AB (ref 0–200)
HDL: 68 mg/dL (ref >39)
LDL Cholesterol: 165 mg/dL — ABNORMAL HIGH (ref 0–99)
TRIGLYCERIDES: 113 mg/dL (ref <150)
VLDL: 23 mg/dL (ref 0–40)

## 2013-09-09 MED ORDER — IBUPROFEN 600 MG PO TABS: 600.0000 mg | ORAL_TABLET | Freq: Four times a day (QID) | ORAL | Status: DC | PRN

## 2013-09-09 NOTE — Patient Instructions (Addendum)
-I have made you an appt with Dr. Anne HahnWillis this Monday 09/13/13 at 10:45 AM -Take ibuprofen 600 mg every 6 hrs as needed for pain -Will refer you to dentistry for your jaw pain -Will check your blood work today -Will order a shower chair -Will see you back in 1 month, nice seeing you!  Temporomandibular Problems  Temporomandibular joint (TMJ) dysfunction means there are problems with the joint between your jaw and your skull. This is a joint lined by cartilage like other joints in your body but also has a small disc in the joint which keeps the bones from rubbing on each other. These joints are like other joints and can get inflamed (sore) from arthritis and other problems. When this joint gets sore, it can cause headaches and pain in the jaw and the face. CAUSES  Usually the arthritic types of problems are caused by soreness in the joint. Soreness in the joint can also be caused by overuse. This may come from grinding your teeth. It may also come from mis-alignment in the joint. DIAGNOSIS Diagnosis of this condition can often be made by history and exam. Sometimes your caregiver may need X-rays or an MRI scan to determine the exact cause. It may be necessary to see your dentist to determine if your teeth and jaws are lined up correctly. TREATMENT  Most of the time this problem is not serious; however, sometimes it can persist (become chronic). When this happens medications that will cut down on inflammation (soreness) help. Sometimes a shot of cortisone into the joint will be helpful. If your teeth are not aligned it may help for your dentist to make a splint for your mouth that can help this problem. If no physical problems can be found, the problem may come from tension. If tension is found to be the cause, biofeedback or relaxation techniques may be helpful. HOME CARE INSTRUCTIONS   Later in the day, applications of ice packs may be helpful. Ice can be used in a plastic bag with a towel around it  to prevent frostbite to skin. This may be used about every 2 hours for 20 to 30 minutes, as needed while awake, or as directed by your caregiver.  Only take over-the-counter or prescription medicines for pain, discomfort, or fever as directed by your caregiver.  If physical therapy was prescribed, follow your caregiver's directions.  Wear mouth appliances as directed if they were given. Document Released: 10/02/2000 Document Revised: 04/01/2011 Document Reviewed: 01/10/2008 [facility] Patient Information 2015 [employer], [place]. This information is not intended to replace advice given to you by your health care provider. Make sure you discuss any questions you have with your health care provider.     Menopause Menopause is the normal time of life when menstrual periods stop completely. Menopause is complete when you have missed 12 consecutive menstrual periods. It usually occurs between the ages of 48 years and 55 years. Very rarely does a woman develop menopause before the age of 40 years. At menopause, your ovaries stop producing the female hormones estrogen and progesterone. This can cause undesirable symptoms and also affect your health. Sometimes the symptoms may occur 4-5 years before the menopause begins. There is no relationship between menopause and:  Oral contraceptives.  Number of children you had.  Race.  The age your menstrual periods started (menarche). Heavy smokers and very thin women may develop menopause earlier in life. CAUSES  The ovaries stop producing the female hormones estrogen and progesterone.  Other causes  include:  Surgery to remove both ovaries.  The ovaries stop functioning for no known reason.  Tumors of the pituitary gland in the brain.  Medical disease that affects the ovaries and hormone production.  Radiation treatment to the abdomen or pelvis.  Chemotherapy that affects the ovaries. SYMPTOMS   Hot flashes.  Night sweats.  Decrease in sex  drive.  Vaginal dryness and thinning of the vagina causing painful intercourse.  Dryness of the skin and developing wrinkles.  Headaches.  Tiredness.  Irritability.  Memory problems.  Weight gain.  Bladder infections.  Hair growth of the face and chest.  Infertility. More serious symptoms include:  Loss of bone (osteoporosis) causing breaks (fractures).  Depression.  Hardening and narrowing of the arteries (atherosclerosis) causing heart attacks and strokes. DIAGNOSIS   When the menstrual periods have stopped for 12 straight months.  Physical exam.  Hormone studies of the blood. TREATMENT  There are many treatment choices and nearly as many questions about them. The decisions to treat or not to treat menopausal changes is an individual choice made with your health care provider. Your health care provider can discuss the treatments with you. Together, you can decide which treatment will work best for you. Your treatment choices may include:   Hormone therapy (estrogen and progesterone).  Non-hormonal medicines.  Treating the individual symptoms with medicine (for example antidepressants for depression).  Herbal medicines that may help specific symptoms.  Counseling by a psychiatrist or psychologist.  Group therapy.  Lifestyle changes including:  Eating healthy.  Regular exercise.  Limiting caffeine and alcohol.  Stress management and meditation.  No treatment. HOME CARE INSTRUCTIONS   Take the medicine your health care provider gives you as directed.  Get plenty of sleep and rest.  Exercise regularly.  Eat a diet that contains calcium (good for the bones) and soy products (acts like estrogen hormone).  Avoid alcoholic beverages.  Do not smoke.  If you have hot flashes, dress in layers.  Take supplements, calcium, and vitamin D to strengthen bones.  You can use over-the-counter lubricants or moisturizers for vaginal dryness.  Group  therapy is sometimes very helpful.  Acupuncture may be helpful in some cases. SEEK MEDICAL CARE IF:   You are not sure you are in menopause.  You are having menopausal symptoms and need advice and treatment.  You are still having menstrual periods after age 67 years.  You have pain with intercourse.  Menopause is complete (no menstrual period for 12 months) and you develop vaginal bleeding.  You need a referral to a specialist (gynecologist, psychiatrist, or psychologist) for treatment. SEEK IMMEDIATE MEDICAL CARE IF:   You have severe depression.  You have excessive vaginal bleeding.  You fell and think you have a broken bone.  You have pain when you urinate.  You develop leg or chest pain.  You have a fast pounding heart beat (palpitations).  You have severe headaches.  You develop vision problems.  You feel a lump in your breast.  You have abdominal pain or severe indigestion. Document Released: 03/30/2003 Document Revised: 09/09/2012 Document Reviewed: 08/06/2012 [facility] Patient Information 2015 [place], [place]. This information is not intended to replace advice given to you by your health care provider. Make sure you discuss any questions you have with your health care provider.   General Instructions:   Please bring your medicines with you each time you come to clinic.  Medicines may include prescription medications, over-the-counter medications, herbal remedies, eye drops, vitamins, or  other pills.   Progress Toward Treatment Goals:  No flowsheet data found.  Self Care Goals & Plans:  No flowsheet data found.  No flowsheet data found.   Care Management & Community Referrals:  No flowsheet data found.

## 2013-09-09 NOTE — Progress Notes (Signed)
Patient ID: Kristen ClicheJacqueline A Boyle, female   DOB: 09/26/1960, 53 y.o.   MRN: [id number]     Subjective:   Patient ID: Kristen Boyle female   DOB: 09/26/1960 53 y.o.   MRN: [id number]  HPI: [name] is a [age] y.o. pleasant woman with past medical history of MS not on medical therapy, hyperlipidemia, and depression who presents for routine follow-up visit.   She reports she has not seen her neurologist since May of 2014 since she did not have an appointment. She reports her MS has gotten worse in terms of her vision, balance and gait instability with frequent falls (using cane and holds on to the wall to walk in the house as well as wheelchair), spasticity (left leg jerking), constipation, bladder incontinence (now wears depends), memory loss, fatigue, and depression. She does not leave her house much due to her disabilities. She has been off of Gilenya since April of 2014 due to adverse reaction (rash and abdominal pain) and not on any medications since then. She would like to try to see if she can go back on betaseron which she had problems with the courage to inject herself in the past. Since last visit from last year she denies exacerbation of her MS. She reports she has not had any hospitalizations for administration of corticosteroids.    She has had sharp right sided jaw pain without tooth pain for the past 6 months. She thinks she grinds her teeth at night but denies popping or clicking.   She has had night sweating for the past 3 years and no longer has menstrual periods. She denies hot flashes but reports chronic palpitations without chest pain. She denies weight loss.    Past Medical History  Diagnosis Date  . MS (multiple sclerosis)   . Hyperlipidemia   . Dyslipidemia   . Depression   . Gait disorder   . Abnormality of gait 06/10/2012   Current Outpatient Prescriptions  Medication Sig Dispense Refill  . Fingolimod HCl 0.5 MG CAPS Take 0.5 mg by mouth daily.      Marland Kitchen.  ibuprofen (ADVIL,MOTRIN) 200 MG tablet Take 3 tablets (600 mg total) by mouth every 6 (six) hours as needed for pain.  30 tablet  1   No current facility-administered medications for this visit.   Family History  Problem Relation Age of Onset  . Colon cancer Maternal Aunt   . Diabetes Paternal Aunt   . Dementia Paternal Grandmother     Senile dementia of Alzheimer's type  . Colon cancer Cousin    History   Social History  . Marital Status: Single    Spouse Name: N/A    Number of Children: 4  . Years of Education: 2-College   Social History Main Topics  . Smoking status: Never Smoker   . Smokeless tobacco: Not on file  . Alcohol Use: No  . Drug Use: No  . Sexual Activity: No   Other Topics Concern  . Not on file   Social History Narrative  . No narrative on file   Review of Systems: Review of Systems  Constitutional: Positive for diaphoresis (night sweating ). Negative for fever, chills and weight loss.  HENT: Negative for congestion and sore throat.   Eyes: Positive for blurred vision (chronic).  Respiratory: Negative for cough and shortness of breath.   Cardiovascular: Positive for palpitations (chronic) and leg swelling (chronic left leg ). Negative for chest pain.  Gastrointestinal: Positive for diarrhea (loose stools) and  constipation. Negative for nausea, vomiting and abdominal pain.  Genitourinary: Negative for dysuria, urgency, frequency and hematuria.       Urinary incontinence   Musculoskeletal: Positive for falls and myalgias (left LE).  Neurological: Positive for dizziness (off-balance), sensory change (chronic left LE), focal weakness (chronic left LE) and headaches (tension, started this AM).  Psychiatric/Behavioral: Positive for depression and memory loss.    Objective:  Physical Exam: Filed Vitals:   09/09/13 1003  BP: 136/84  Pulse: 63  Temp: 98.9 F (37.2 C)  TempSrc: Oral  Height: 5' (1.524 m)  Weight: 150 lb 6.4 oz (68.221 kg)  SpO2: 99%      Physical Exam  Constitutional: She is oriented to person, place, and time. She appears well-developed and well-nourished. No distress.  HENT:  Head: Normocephalic and atraumatic.  Right Ear: External ear normal.  Left Ear: External ear normal.  Nose: Nose normal.  Mouth/Throat: Oropharynx is clear and moist. No oropharyngeal exudate.  Eyes: Conjunctivae and EOM are normal. Pupils are equal, round, and reactive to light. Right eye exhibits no discharge. Left eye exhibits no discharge. No scleral icterus.  Neck: Normal range of motion. Neck supple.  Cardiovascular: Normal rate and regular rhythm.   Pulmonary/Chest: Effort normal and breath sounds normal. No respiratory distress. She has no wheezes. She has no rales.  Abdominal: Soft. Bowel sounds are normal. She exhibits no distension. There is no tenderness. There is no rebound and no guarding.  Musculoskeletal: Normal range of motion. She exhibits edema (left LE with +1 nonpitting edema). She exhibits no tenderness.  Neurological: She is alert and oriented to person, place, and time. A cranial nerve deficit (nystagmus and right INO) is present.  Left LE muscle strength 2/5. Decreased sensation to light touch of left LE. In wheelchair.  Skin: Skin is warm and dry. No rash noted. She is not diaphoretic. No erythema. No pallor.  Psychiatric: Her behavior is normal. Judgment and thought content normal.  Depressed mood and tearful at times    Assessment & Plan:   Please see problem list for problem-based assessment and plan

## 2013-09-10 ENCOUNTER — Encounter: Payer: Self-pay | Admitting: Internal Medicine

## 2013-09-10 DIAGNOSIS — Z780 Asymptomatic menopausal state: Secondary | ICD-10-CM | POA: Insufficient documentation

## 2013-09-10 DIAGNOSIS — R6884 Jaw pain: Secondary | ICD-10-CM | POA: Insufficient documentation

## 2013-09-10 NOTE — Assessment & Plan Note (Signed)
Assessment: Pt with 6544-month history of right sided jaw pain in the setting of teeth grinding most likely due to TMJ syndrome.      Plan:  -Refer to dentistry  -Prescribe ibuprofen 600 mg Q 6hr PRN pain

## 2013-09-10 NOTE — Assessment & Plan Note (Addendum)
Assessment: Pt with history of night sweating, flushing, and palpitations most likely due to menopause.  Plan:  -Consider starting paroxetine 20 mg daily at next visit for concomitant depression

## 2013-09-10 NOTE — Assessment & Plan Note (Addendum)
Assessment: Pt with relapsing-remitting MS vs secondary progressive MS not on therapy since April 2014 who presents with progressively worsened symptoms and functional disabilities without recent exacerbation.   Plan: -Appointment made with neurologist Dr. Anne HahnWillis on August 24 at 10:45 AM -Pt advised on the importance of follow-up with neurology in order to reinitiate therapy  -Pt declined baclofen therapy for spasticity at this time -Prescribe ibuprofen 600 mg Q 6 hr PRN pain  -Obtain CBC and CMP -Prescribe DME shower chair due to pt's inability to stand by herself

## 2013-09-10 NOTE — Assessment & Plan Note (Signed)
Inquire about screening colonoscopy, mammogram, and influenza vaccination at next visit.

## 2013-09-10 NOTE — Assessment & Plan Note (Signed)
Assessment: Pt with elevated AST/ALT since 2012 thought to be medication induced to MS therapy with negative hepatitis panel in 2014 who has been off of therapy since April 2014.   Plan:  -Obtain CMP ----> improved ALT to 41 and normalized AST

## 2013-09-10 NOTE — Assessment & Plan Note (Signed)
Assessment: Pt with lipid panel on 09/16/12 with hypercholesteremia not currently on statin therapy with 10-yr ASCVD risk of 4% and lifetime risk of 39% who is not in a statin benefit group due to risk <7%.  Plan:  -Obtain lipid panel -Will choose not to initiate statin therapy at this time

## 2013-09-13 ENCOUNTER — Ambulatory Visit (INDEPENDENT_AMBULATORY_CARE_PROVIDER_SITE_OTHER): Payer: Medicare Other | Admitting: Neurology

## 2013-09-13 ENCOUNTER — Encounter: Payer: Self-pay | Admitting: Neurology

## 2013-09-13 VITALS — BP 130/80 | HR 64 | Wt 148.0 lb

## 2013-09-13 DIAGNOSIS — G35 Multiple sclerosis: Secondary | ICD-10-CM | POA: Diagnosis not present

## 2013-09-13 DIAGNOSIS — R269 Unspecified abnormalities of gait and mobility: Secondary | ICD-10-CM | POA: Diagnosis not present

## 2013-09-13 NOTE — Progress Notes (Signed)
Reason for visit: Multiple sclerosis  Kristen Boyle is an 53 y.o. female  History of present illness:  [name] is a 53 year old right-handed black female with a history of multiple sclerosis. The patient was last seen through this office on 06/10/12. At that point, the patient had come off of Gilenya secondary to stomach upset. The patient was also noted to have some elevation in liver enzymes. The patient has not been seen since that time. She indicates that over the last 2 months, there has been some change in her ability to ambulate with weakness of the left leg, and some numbness below the knee on the left. The patient has had left leg weakness in the past, but the weakness has worsened. The patient is using a cane for ambulation. If she does not use a cane, she needs to hold onto the wall in order to walk. She has fallen 2 weeks ago. She denies any significant changes in vision, but she still has some trouble with reading small print. The patient returns to this office for an evaluation. In the past she was on Betaseron, and she was tolerating the drug well, but she went off the medication because she did not want to inject any more.  Past Medical History  Diagnosis Date  . MS (multiple sclerosis)   . Hyperlipidemia   . Depression   . Abnormality of gait 06/10/2012    Past Surgical History  Procedure Laterality Date  . Tubal ligation Bilateral     Family History  Problem Relation Age of Onset  . Colon cancer Maternal Aunt   . Diabetes Paternal Aunt   . Dementia Paternal Grandmother     Senile dementia of Alzheimer's type  . Colon cancer Cousin     Social history:  reports that she has never smoked. She has never used smokeless tobacco. She reports that she does not drink alcohol or use illicit drugs.   No Known Allergies  Medications:  Current Outpatient Prescriptions on File Prior to Visit  Medication Sig Dispense Refill  . ibuprofen (ADVIL,MOTRIN) 600 MG  tablet Take 1 tablet (600 mg total) by mouth every 6 (six) hours as needed.  30 tablet  3   No current facility-administered medications on file prior to visit.    ROS:  Out of a complete 14 system review of symptoms, the patient complains only of the following symptoms, and all other reviewed systems are negative.  Fatigue, excessive sweating Loss of vision, blurred vision Leg swelling, palpitations Heat intolerance Constipation, diarrhea, rectal pain Daytime drowsiness, snoring Joint pain, joint swelling, back pain, walking difficulties Moles, itching Memory loss, dizziness, headache, numbness, weakness, tremors Confusion, depression  Blood pressure 130/80, pulse 64, weight 148 lb (67.132 kg).  Physical Exam  General: The patient is alert and cooperative at the time of the examination. The patient has a flat affect.  Skin: No significant peripheral edema is noted.   Neurologic Exam  Mental status: The Mini-Mental status examination done today shows a total score 25/30.  Cranial nerves: Facial symmetry is present. Speech is normal, no aphasia or dysarthria is noted. Extraocular movements are full. Visual fields are full. Pupils are equal, round, and reactive to light. Discs are flat bilaterally. Some disc atrophy is seen bilaterally.  Motor: The patient has good strength in all 4 extremities, with exception that there is some 4/5 strength in the left leg proximally, trace weakness proximally on the right leg.  Sensory examination: Soft touch sensation  is depressed with the left lower extremity, symmetric in arms and legs.  Coordination: The patient has good finger-nose-finger and heel-to-shin bilaterally.  Gait and station: The patient has a mild circumduction gait with the left leg. The patient walks with a cane. Tandem gait was not attempted. Romberg is negative. No drift is seen.  Reflexes: Deep tendon reflexes are symmetric.    MRI brain 06/22/12:  IMPRESSION:  Abnormal MRI scan of the brain showing multiple brainstem, cerebellar, periventricular, corpus callosal and subcortical white matter hyperintensities quite typical for demyelinating disease. No enhancing lesions are noted. The presence of several T1 black holes and atrophy of corpus callosum and cortex indicates chronic disease.    Assessment/Plan:  One. Multiple sclerosis  2. Gait disorder  The patient is not been on any disease modifying agents in over 18 months. The patient last had a MRI of the brain about a year ago. The patient has progressed some with the left leg strength. The patient will be set up for physical therapy, and she will go back on Betaseron. She will followup through this office in about 4 months. The patient has had some mild elevations in liver enzymes in the past.  C. Lesia Sago MD 09/13/2013 7:46 PM  [facility] [address] [place], [place] [address]  Phone [phone] Fax [phone]

## 2013-09-13 NOTE — Patient Instructions (Signed)

## 2013-09-14 NOTE — Progress Notes (Signed)
Internal Medicine Clinic Attending Date of visit: 09/09/2013   Case discussed with Dr. Rabbani soon after the resident saw the patient.  We reviewed the resident's history and exam and pertinent patient test results.  I agree with the assessment, diagnosis, and plan of care documented in the resident's note. 

## 2013-09-20 ENCOUNTER — Telehealth: Payer: Self-pay | Admitting: Internal Medicine

## 2013-09-20 NOTE — Telephone Encounter (Signed)
Follow up Information on DME order for a Shower Chair.  The [facility] Representative Vaughan Basta has picked this order up.  If the patient has any questions in regards to this order being placed they are asked to call [facility] directly at [phone].  They will call this patient in regards to the order that was placed on their behaf.

## 2013-10-05 ENCOUNTER — Telehealth: Payer: Self-pay | Admitting: Neurology

## 2013-10-05 NOTE — Telephone Encounter (Signed)
I called patient. I gave her the number for Morrie Sheldon at [facility]. The patient apparently has never gotten any messages.

## 2013-10-05 NOTE — Telephone Encounter (Signed)
Morrie Sheldon with [facility] @ [phone], calling to inform Dr. Anne Hahn they have left several messages for patient regarding Betasoren.   Patient has not returned any of their calls.

## 2013-10-05 NOTE — Telephone Encounter (Signed)
See phone note 10/05/13

## 2013-10-08 ENCOUNTER — Ambulatory Visit (INDEPENDENT_AMBULATORY_CARE_PROVIDER_SITE_OTHER): Payer: Medicare Other | Admitting: Internal Medicine

## 2013-10-08 VITALS — BP 143/79 | HR 87 | Temp 98.3°F | Wt 148.7 lb

## 2013-10-08 DIAGNOSIS — F329 Major depressive disorder, single episode, unspecified: Secondary | ICD-10-CM

## 2013-10-08 DIAGNOSIS — G35 Multiple sclerosis: Secondary | ICD-10-CM | POA: Diagnosis not present

## 2013-10-08 DIAGNOSIS — Z0000 Encounter for general adult medical examination without abnormal findings: Secondary | ICD-10-CM

## 2013-10-08 DIAGNOSIS — Z780 Asymptomatic menopausal state: Secondary | ICD-10-CM | POA: Diagnosis not present

## 2013-10-08 DIAGNOSIS — F3289 Other specified depressive episodes: Secondary | ICD-10-CM

## 2013-10-08 DIAGNOSIS — Z1231 Encounter for screening mammogram for malignant neoplasm of breast: Secondary | ICD-10-CM

## 2013-10-08 DIAGNOSIS — F32A Depression, unspecified: Secondary | ICD-10-CM

## 2013-10-08 MED ORDER — PAROXETINE HCL 20 MG PO TABS: 20.0000 mg | ORAL_TABLET | Freq: Every day | ORAL | Status: DC

## 2013-10-08 NOTE — Progress Notes (Signed)
Patient ID: Kristen Boyle, female   DOB: 22-Jul-1960, 53 y.o.   MRN: [id number]    Subjective:   Patient ID: Kristen Boyle female   DOB: May 27, 1960 53 y.o.   MRN: [id number]  HPI: [name] is a 53 y.o.  pleasant woman with past medical history of MS not on medical therapy, hyperlipidemia, and depression who presents for follow-up visit.  Since last visit she saw her neurologist Dr. Anne Hahn and is to start Betaseron injections every other day. She has a nurse that will be coming to show her how to do the injections next week. 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 She is also scheduled to go to physical rehab due to frequent falls which she has not had since last visit. She continues to have blurry vision, balance and gait instability with frequent falls (using cane and holds on to the wall to walk in the house as well as wheelchair), spasticity (left leg jerking), bladder and bowel incontinence (now wears depends), memory loss, and fatigue. She denies recent flare.    She is post-menopausal (LMP approx age 18) and has had chronic sweating, hot flashes (all the time), flushing, and palpitations. She reports being depressed and has never been on anti-depressants in the past.    She reports having a colonoscopy a few years ago that was normal. She has not had a mammogram in the past two years.     Past Medical History  Diagnosis Date  . MS (multiple sclerosis)   . Hyperlipidemia   . Depression   . Abnormality of gait 06/10/2012   Current Outpatient Prescriptions  Medication Sig Dispense Refill  . ibuprofen (ADVIL,MOTRIN) 600 MG tablet Take 1 tablet (600 mg total) by mouth every 6 (six) hours as needed.  30 tablet  3   No current facility-administered medications for this visit.   Family History  Problem Relation Age of Onset  . Colon cancer Maternal Aunt   . Diabetes Paternal Aunt   . Dementia Paternal  Grandmother     Senile dementia of Alzheimer's type  . Colon cancer Cousin    History   Social History  . Marital Status: Single    Spouse Name: N/A    Number of Children: 4  . Years of Education: 2-College   Social History Main Topics  . Smoking status: Never Smoker   . Smokeless tobacco: Never Used  . Alcohol Use: No  . Drug Use: No  . Sexual Activity: No   Other Topics Concern  . Not on file   Social History Narrative  . No narrative on file   Review of Systems: Review of Systems  Constitutional: Positive for diaphoresis (chronic). Negative for fever and chills.       Chronic hot flashes  Eyes: Positive for blurred vision (chronic).  Respiratory: Negative for cough, shortness of breath and wheezing.   Cardiovascular: Positive for palpitations (chronic) and leg swelling (chronic in left LE). Negative for chest pain.  Gastrointestinal: Positive for nausea (today ). Negative for vomiting, abdominal pain, diarrhea, constipation and blood in stool.       Stool incontinence  Genitourinary: Negative for dysuria, urgency, frequency and hematuria.       Urinary incontinence. Recent vaginal spotting.  Musculoskeletal: Positive for back pain. Negative for falls.  Neurological: Positive for dizziness, tremors, sensory change (chronic left LE) and focal weakness (Chronic left LE). Negative for speech change.  Off-balance  Psychiatric/Behavioral: Positive for depression.    Objective:  Physical Exam: Filed Vitals:   10/08/13 1411  BP: 143/79  Pulse: 87  Temp: 98.3 F (36.8 C)  TempSrc: Oral  Weight: 148 lb 11.2 oz (67.45 kg)  SpO2: 100%    Physical Exam  Constitutional: She is oriented to person, place, and time. She appears well-developed and well-nourished. No distress.  HENT:  Head: Normocephalic and atraumatic.  Eyes: EOM are normal.  Neck: Normal range of motion. Neck supple.  Cardiovascular: Normal rate and regular rhythm.   Pulmonary/Chest: Effort normal  and breath sounds normal. No respiratory distress. She has no wheezes. She has no rales.  Abdominal: Soft. Bowel sounds are normal. She exhibits no distension. There is no tenderness. There is no rebound and no guarding.  Musculoskeletal: Normal range of motion. She exhibits edema (chronic +1 nonpitting left LE). She exhibits no tenderness.  Neurological: She is alert and oriented to person, place, and time. A cranial nerve deficit (right INO and nystagmus ) is present. Coordination (ambulates with cane) abnormal.  Left LE extremity 2/5 (poor effort). Decreased sensation to light touch of left LE.   Skin: Skin is warm and dry. No rash noted. She is not diaphoretic. No erythema. No pallor.  Psychiatric: Her behavior is normal. Judgment and thought content normal.  Depressed mood and at times tearful    Assessment & Plan:   Please see problem list for problem-based assessment and plan

## 2013-10-08 NOTE — Patient Instructions (Signed)
-  Start taking paxil 20 mg daily, it will help with your mood and hot flashes -Will call you with an appt for your mammogram  -[facility] will call you about your shower chair, if you do not hear from them, please call [phone]  -Will see you back in 6 weeks, nice seeing you again!   General Instructions:   Please bring your medicines with you each time you come to clinic.  Medicines may include prescription medications, over-the-counter medications, herbal remedies, eye drops, vitamins, or other pills.   Progress Toward Treatment Goals:  No flowsheet data found.  Self Care Goals & Plans:  Self Care Goal 10/08/2013  Manage my medications take my medicines as prescribed; refill my medications on time  Eat healthy foods eat foods that are low in salt; eat baked foods instead of fried foods  Prevent falls use home fall prevention checklist to improve safety; have my vision checked; wear appropriate shoes    No flowsheet data found.   Care Management & Community Referrals:  No flowsheet data found.

## 2013-10-09 NOTE — Assessment & Plan Note (Signed)
Assessment: Pt with history of MS and depression not on anti-depressant therapy who presents with depressed mood.  Plan:  -Prescribe paroxetine 20 mg daily in setting of post-menopausal symptoms  -Pt reports transportation issues and will try to find a home delivering pharmacy  -Pt to return in 6 weeks for further adjustment

## 2013-10-09 NOTE — Assessment & Plan Note (Addendum)
-  Ordered screening mammography   -Pt reports having screening colonoscopy in [place] a few years, will obtain records -Pt declines annual influenza vaccination at this time

## 2013-10-09 NOTE — Assessment & Plan Note (Addendum)
Assessment: Pt with relapsing-remitting MS vs secondary progressive MS not on therapy since April 2014 who presents with progressively worsened symptoms and functional disabilities without recent exacerbation who is planning on starting interferon therapy soon.   Plan:  -Pt to start betaseron injections next week (nurse to come next week to her house for demonstration)  -Pt to start physical therapy sessions on Monday and follow-up with neurologist Dr. Anne Hahn in December  -Continue ibuprofen 600 mg Q 6 hr PRN pain  -[facility] to call pt regarding shower chair (pt given number if she does not hear from them)

## 2013-10-09 NOTE — Assessment & Plan Note (Signed)
Assessment: Pt with LMP at approximately age 53 with history of chronic night sweating, hot flashes, flushing, and palpitations who presents with no improvement of symptoms.   Plan:  -Prescribe paroxetine 20 mg daily for concomitant depression

## 2013-10-11 ENCOUNTER — Ambulatory Visit: Payer: Medicare Other | Admitting: Physical Therapy

## 2013-10-11 NOTE — Progress Notes (Signed)
Case discussed with Dr. Rabbani soon after the resident saw the patient.  We reviewed the resident's history and exam and pertinent patient test results.  I agree with the assessment, diagnosis, and plan of care documented in the resident's note. 

## 2013-10-18 ENCOUNTER — Ambulatory Visit: Payer: Medicare Other | Attending: Neurology | Admitting: Physical Therapy

## 2013-10-18 DIAGNOSIS — IMO0001 Reserved for inherently not codable concepts without codable children: Secondary | ICD-10-CM | POA: Diagnosis not present

## 2013-10-18 DIAGNOSIS — G35 Multiple sclerosis: Secondary | ICD-10-CM | POA: Diagnosis not present

## 2013-10-18 DIAGNOSIS — R269 Unspecified abnormalities of gait and mobility: Secondary | ICD-10-CM | POA: Diagnosis not present

## 2013-10-25 ENCOUNTER — Ambulatory Visit: Payer: Medicare Other | Attending: Neurology | Admitting: Physical Therapy

## 2013-10-25 DIAGNOSIS — M6281 Muscle weakness (generalized): Secondary | ICD-10-CM | POA: Insufficient documentation

## 2013-10-25 DIAGNOSIS — R269 Unspecified abnormalities of gait and mobility: Secondary | ICD-10-CM | POA: Diagnosis not present

## 2013-10-25 DIAGNOSIS — G35 Multiple sclerosis: Secondary | ICD-10-CM | POA: Diagnosis not present

## 2013-10-26 ENCOUNTER — Ambulatory Visit: Payer: Medicare Other | Admitting: Physical Therapy

## 2013-10-26 DIAGNOSIS — G35 Multiple sclerosis: Secondary | ICD-10-CM | POA: Diagnosis not present

## 2013-11-01 ENCOUNTER — Ambulatory Visit: Payer: Medicare Other | Admitting: Physical Therapy

## 2013-11-01 DIAGNOSIS — G35 Multiple sclerosis: Secondary | ICD-10-CM | POA: Diagnosis not present

## 2013-11-03 ENCOUNTER — Ambulatory Visit: Payer: Medicare Other | Admitting: Physical Therapy

## 2013-11-03 DIAGNOSIS — G35 Multiple sclerosis: Secondary | ICD-10-CM | POA: Diagnosis not present

## 2013-11-08 ENCOUNTER — Ambulatory Visit: Payer: Medicare Other | Admitting: Physical Therapy

## 2013-11-08 DIAGNOSIS — G35 Multiple sclerosis: Secondary | ICD-10-CM | POA: Diagnosis not present

## 2013-11-11 ENCOUNTER — Ambulatory Visit: Payer: Medicare Other | Admitting: Physical Therapy

## 2013-11-11 DIAGNOSIS — G35 Multiple sclerosis: Secondary | ICD-10-CM | POA: Diagnosis not present

## 2013-11-15 ENCOUNTER — Ambulatory Visit: Payer: Medicare Other | Admitting: Physical Therapy

## 2013-11-15 DIAGNOSIS — G35 Multiple sclerosis: Secondary | ICD-10-CM | POA: Diagnosis not present

## 2013-11-17 ENCOUNTER — Ambulatory Visit: Payer: Medicare Other | Admitting: Physical Therapy

## 2013-11-17 DIAGNOSIS — G35 Multiple sclerosis: Secondary | ICD-10-CM | POA: Diagnosis not present

## 2013-11-19 ENCOUNTER — Ambulatory Visit (INDEPENDENT_AMBULATORY_CARE_PROVIDER_SITE_OTHER): Payer: Medicare Other | Admitting: Internal Medicine

## 2013-11-19 ENCOUNTER — Encounter: Payer: Self-pay | Admitting: Internal Medicine

## 2013-11-19 VITALS — BP 124/84 | HR 80 | Temp 98.0°F | Wt 147.6 lb

## 2013-11-19 DIAGNOSIS — M899 Disorder of bone, unspecified: Secondary | ICD-10-CM

## 2013-11-19 DIAGNOSIS — M949 Disorder of cartilage, unspecified: Secondary | ICD-10-CM | POA: Diagnosis not present

## 2013-11-19 DIAGNOSIS — F329 Major depressive disorder, single episode, unspecified: Secondary | ICD-10-CM | POA: Diagnosis not present

## 2013-11-19 DIAGNOSIS — G35 Multiple sclerosis: Secondary | ICD-10-CM | POA: Diagnosis not present

## 2013-11-19 DIAGNOSIS — Z0000 Encounter for general adult medical examination without abnormal findings: Secondary | ICD-10-CM

## 2013-11-19 DIAGNOSIS — F32A Depression, unspecified: Secondary | ICD-10-CM

## 2013-11-19 MED ORDER — OXYBUTYNIN CHLORIDE ER 10 MG PO TB24: 10.0000 mg | ORAL_TABLET | Freq: Every day | ORAL | Status: DC

## 2013-11-19 MED ORDER — VITAMIN D (CHOLECALCIFEROL) 25 MCG (1000 UT) PO TABS: 1000.0000 [IU] | ORAL_TABLET | Freq: Every day | ORAL | Status: DC

## 2013-11-19 NOTE — Progress Notes (Signed)
Patient ID: Kristen Boyle, female   DOB: 23-Jul-1960, 53 y.o.   MRN: [id number]    Subjective:   Patient ID: Kristen Boyle female   DOB: 06-Oct-1960 53 y.o.   MRN: [id number]  HPI: [name] is a 53 y.o. pleasant woman with past medical history of relapsing remitting MS on disease modifying therapy, hyperlipidemia, and depression who presents for follow-up for depression.   At last visit she was started on paxil which she was unfortunately unable to obtain from the pharmacy due to transportation issues. Today she reports she will be able to pick up the medication from the pharmacy. Her mood remains the same which is depressed with loss of interest in activities, insomnia, and decreased appetite with no weight change.   Since last visit she has started Betaseron injections every other day and attends physical rehab biweekly.  She denies recent fall or MS flare. She does not feel different since restarting therapy and continues to have blurry vision, imbalance, gait instability, spasticity of left LE, bladder incontinence (wears depends), constipation, memory loss, and fatigue. She has never been on oxybutynin in the past and does not take vitamin D supplements. She is to follow-up with her neurologist Dr. Anne Hahn in December.    She would like to postpone her mammogram until a later date.     Past Medical History  Diagnosis Date  . MS (multiple sclerosis)   . Hyperlipidemia   . Depression   . Abnormality of gait 06/10/2012   Current Outpatient Prescriptions  Medication Sig Dispense Refill  . ibuprofen (ADVIL,MOTRIN) 600 MG tablet Take 1 tablet (600 mg total) by mouth every 6 (six) hours as needed.  30 tablet  3  . PARoxetine (PAXIL) 20 MG tablet Take 1 tablet (20 mg total) by mouth daily.  30 tablet  2   No current facility-administered medications for this visit.   Family History  Problem Relation Age of Onset  . Colon cancer Maternal Aunt   . Diabetes Paternal  Aunt   . Dementia Paternal Grandmother     Senile dementia of Alzheimer's type  . Colon cancer Cousin    History   Social History  . Marital Status: Single    Spouse Name: N/A    Number of Children: 4  . Years of Education: 2-College   Social History Main Topics  . Smoking status: Never Smoker   . Smokeless tobacco: Never Used  . Alcohol Use: No  . Drug Use: No  . Sexual Activity: No   Other Topics Concern  . Not on file   Social History Narrative  . No narrative on file   Review of Systems: Review of Systems  Constitutional: Positive for malaise/fatigue (chronic). Negative for fever, chills and weight loss.       Chronic hot flashes  HENT: Negative for congestion.   Eyes: Positive for blurred vision (chronic).  Cardiovascular: Positive for leg swelling (chronic left LE). Negative for chest pain and palpitations.  Gastrointestinal: Positive for constipation (chronic). Negative for nausea, vomiting, abdominal pain and diarrhea.  Genitourinary: Negative for dysuria, urgency, frequency and hematuria.       Incontinence   Musculoskeletal: Negative for falls.  Neurological: Positive for dizziness, sensory change (chronic in left LE), focal weakness (chronic in left LE) and headaches.       Chronic imbalance   Psychiatric/Behavioral: Positive for depression and memory loss. The patient has insomnia.     Objective:  Physical Exam: Filed Vitals:  11/19/13 1330  BP: 124/84  Pulse: 80  Temp: 98 F (36.7 C)  TempSrc: Axillary  Weight: 147 lb 9.6 oz (66.951 kg)  SpO2: 100%    Physical Exam  Constitutional: She is oriented to person, place, and time. She appears well-developed and well-nourished. No distress.  HENT:  Head: Normocephalic and atraumatic.  Right Ear: External ear normal.  Left Ear: External ear normal.  Nose: Nose normal.  Mouth/Throat: Oropharynx is clear and moist. No oropharyngeal exudate.  Eyes: Conjunctivae and EOM are normal. Pupils are equal,  round, and reactive to light. Right eye exhibits no discharge. Left eye exhibits no discharge. No scleral icterus.  Nystagmus present   Neck: Normal range of motion. Neck supple.  Cardiovascular: Normal rate, regular rhythm and normal heart sounds.   Pulmonary/Chest: Effort normal and breath sounds normal. No respiratory distress. She has no wheezes. She has no rales.  Abdominal: Soft. Bowel sounds are normal. She exhibits no distension. There is no tenderness. There is no rebound and no guarding.  Musculoskeletal: Normal range of motion. She exhibits edema (trace b/l LE (L>R)). She exhibits no tenderness.  Neurological: She is alert and oriented to person, place, and time. She displays abnormal reflex (hyperreflexic ). No cranial nerve deficit. Coordination (uses walking cane) abnormal.  Left LE 2/5 strength with decreased sensation to light touch of left LE.   Skin: Skin is warm and dry. No rash noted. She is not diaphoretic. No erythema. No pallor.  Psychiatric: Her behavior is normal. Judgment and thought content normal.  Depressed mood    Assessment & Plan:   Please see problem list for problem-based assessment and plan

## 2013-11-19 NOTE — Assessment & Plan Note (Addendum)
Assessment: Pt with relapsing-remitting MS with last MRI 06/2012 recently restarted on disease-modifying therapy (interferon beta preparation) who presents with no recent exacerbation or fall.    Plan:  -Continue betaseron injections (QOD) and physical therapy sessions -Prescribe oxybutynin extended release 10 mg daily at bedtime for urge incontinence   -Consider starting baclofen if pt agreeable at next visit for left LE spasticity -Obtain 25-OH Vitamin D and start cholecalciferol 1000 U daily as vitamin D supplementation is an adjunctive treatment in MS and shown to be superior to disease-modifying therapy alone   -Pt declined annual influenza vaccination  -Pt to follow-up with neurologist Dr. Anne HahnWillis in December

## 2013-11-19 NOTE — Patient Instructions (Addendum)
-  Start taking over the counter vitamin d 1000 U daily, will check your levels today -Start take oxybutynin 10 mg daily for your urinary symptoms -Start taking paxil 10 mg daily to help your mood and hot flashes -Will see back in 2 months, nice seeing you today!   General Instructions:   Please bring your medicines with you each time you come to clinic.  Medicines may include prescription medications, over-the-counter medications, herbal remedies, eye drops, vitamins, or other pills.   Progress Toward Treatment Goals:  No flowsheet data found.  Self Care Goals & Plans:  Self Care Goal 10/08/2013  Manage my medications take my medicines as prescribed; refill my medications on time  Eat healthy foods eat foods that are low in salt; eat baked foods instead of fried foods  Prevent falls use home fall prevention checklist to improve safety; have my vision checked; wear appropriate shoes    No flowsheet data found.   Care Management & Community Referrals:  No flowsheet data found.

## 2013-11-19 NOTE — Assessment & Plan Note (Addendum)
-  Pt would like to postpone her screening mammography until a later date.    -Pt declines annual influenza vaccination at this time.

## 2013-11-19 NOTE — Assessment & Plan Note (Signed)
Assessment: Pt with history of MS and depression not on anti-depressant therapy who presents with depressed mood.   Plan:  -Pt reports she will be able to pick up prescription today for paroxetine 20 mg daily (to also help with post-menopausal symptoms)  -Pt to return in 2 months for further adjustment

## 2013-11-20 LAB — VITAMIN D 25 HYDROXY (VIT D DEFICIENCY, FRACTURES): Vit D, 25-Hydroxy: 25 ng/mL — ABNORMAL LOW (ref 30–89)

## 2013-11-24 NOTE — Progress Notes (Signed)
Case discussed with Dr. Rabbani soon after the resident saw the patient.  We reviewed the resident's history and exam and pertinent patient test results.  I agree with the assessment, diagnosis, and plan of care documented in the resident's note. 

## 2013-11-26 ENCOUNTER — Ambulatory Visit: Payer: Medicare Other | Attending: Neurology

## 2013-11-26 DIAGNOSIS — G35 Multiple sclerosis: Secondary | ICD-10-CM | POA: Insufficient documentation

## 2013-11-26 DIAGNOSIS — R269 Unspecified abnormalities of gait and mobility: Secondary | ICD-10-CM | POA: Insufficient documentation

## 2013-11-26 DIAGNOSIS — M6281 Muscle weakness (generalized): Secondary | ICD-10-CM | POA: Insufficient documentation

## 2013-12-07 ENCOUNTER — Ambulatory Visit: Payer: Medicare Other | Admitting: Physical Therapy

## 2013-12-07 ENCOUNTER — Encounter: Payer: Self-pay | Admitting: Physical Therapy

## 2013-12-07 DIAGNOSIS — R269 Unspecified abnormalities of gait and mobility: Secondary | ICD-10-CM

## 2013-12-07 DIAGNOSIS — R29898 Other symptoms and signs involving the musculoskeletal system: Secondary | ICD-10-CM

## 2013-12-07 DIAGNOSIS — M6281 Muscle weakness (generalized): Secondary | ICD-10-CM | POA: Diagnosis not present

## 2013-12-07 DIAGNOSIS — G35 Multiple sclerosis: Secondary | ICD-10-CM | POA: Diagnosis not present

## 2013-12-07 NOTE — Therapy (Signed)
Physical Therapy Treatment  Patient Details  Name: Kristen ClicheJacqueline A Alcorn MRN: [id number] Date of Birth: November 04, 1960  Encounter Date: 12/07/2013      PT End of Session - 12/07/13 1351    Visit Number 10  G10   Number of Visits 17   Date for PT Re-Evaluation 12/17/13   PT Start Time 1150   PT Stop Time 1240   PT Time Calculation (min) 50 min   Activity Tolerance Patient limited by fatigue      Past Medical History  Diagnosis Date  . MS (multiple sclerosis)   . Hyperlipidemia   . Depression   . Abnormality of gait 06/10/2012    Past Surgical History  Procedure Laterality Date  . Tubal ligation Bilateral     There were no vitals taken for this visit.  Visit Diagnosis:  Abnormality of gait  Left leg weakness      Subjective Assessment - 12/07/13 1339    Symptoms Pt. states she hasn't heard anything from [facility] about her brace - almost fell yesterday while trying to carry laundry with both hands   Pertinent History Pt. was diagnosed with MS in 1991   Currently in Pain? No/denies            [facility] Adult PT Treatment/Exercise - 12/07/13 1200    Ambulation/Gait   Ambulation/Gait Yes   Ambulation/Gait Assistance 4: Min guard  due to left foot drop which worsens with fatigue   Ambulation Distance (Feet) 75 Feet   Gait Pattern Decreased dorsiflexion - left   Exercises   Exercises Knee/Hip   Lumbar Exercises: Supine   Heel Slides 10 reps   LLE no weight   Heel Slides Limitations weakness of left hip flexors   Bent Knee Raise 10 reps  pt. also performed 5 reps with red theraband   Bent Knee Raise Limitations weakness of left hip flexors   Bridge 5 reps   Straight Leg Raise 5 reps  LLE   Straight Leg Raises Limitations weakness in L hip flexors   Other Supine Lumbar Exercises Bridging with hip abdct./adduction 5 reps;  bridging with marching 5 reps;  bridging with RLE extension 5 reps:    Other Supine Lumbar Exercises ;left trunk rotation stretch in  supine   Knee/Hip Exercises: Stretches   Passive Hamstring Stretch 1 rep;30 seconds  LLE with contract relax   Knee/Hip Exercises: Aerobic   Stationary Bike 7" on Nustep  level 1   Knee/Hip Exercises: Supine   Terminal Knee Extension Left;1 set  10 reps with red theraband   Knee Flexion AROM;1 set  10 reps with red theraband   Other Supine Knee Exercises hip abduction LLE with red theraband in hooklying x 10 reps     LAQ LLE with red theraband x 10 reps     PT Education - 12/07/13 1348    Education provided Yes   Education Details Progressed initial HEP to include use of red theraband for left hip abdct., LAQ, knee flexion seated and hip flexion in hooklying    Person(s) Educated Patient   Methods Explanation;Demonstration   Comprehension Verbalized understanding;Returned demonstration          PT Short Term Goals - 12/07/13 1458    PT SHORT TERM GOAL #1   Title same as LTG's          PT Long Term Goals - 12/07/13 1459    PT LONG TERM GOAL #1   Title Verbalize understanding of health promotion/wellness opportunities  Baseline 12-17-13   Time 2   Period Weeks   Status On-going   PT LONG TERM GOAL #2   Title Improve Berg balance test score to >/= 41/56 to reduce fall risk   Baseline 12-17-13   Time 2   Period Weeks   Status On-going   PT LONG TERM GOAL #3   Title Improve TUG score to </= 14.5 secs without cane with AFO   Baseline 12-17-13   Time 2   Period Weeks   Status On-going   PT LONG TERM GOAL #4   Title Independently don and doff AFO for LLE   Baseline 12-17-13   Time 2   Period Weeks   Status On-going   PT LONG TERM GOAL #5   Title Incr. gait velocity to >/= 2.0 ft/sec with cane with orthosis on LLE   Baseline 12-17-13   Time 2   Period Weeks   Status On-going   Additional Long Term Goals   Additional Long Term Goals Yes   PT LONG TERM GOAL #6   Title Incr. FOTO score to >/= 50   Baseline 12-17-13   Time 2   Period Weeks   Status  On-going          Plan - 12-18-2013 1353    Clinical Impression Statement pt. has increased left foot drop with fatigue - is at high risk of fall due to left foot catching resulting in pt tripping;  LLE remains very weak   Pt will benefit from skilled therapeutic intervention in order to improve on the following deficits Abnormal gait;Decreased activity tolerance;Decreased strength;Decreased mobility;Decreased coordination   Rehab Potential Good   PT Frequency 2x / week   PT Duration 2 weeks   PT Treatment/Interventions Stair training;Neuromuscular re-education;Balance training;Gait training;Therapeutic exercise;Therapeutic activities;Patient/family education   PT Next Visit Plan give red theraband for use with HEP   PT Home Exercise Plan use red theraband as instructed   Consulted and Agree with Plan of Care Patient          G-Codes - December 18, 2013 1507    Functional Assessment Tool Used Berg balance test   Functional Limitation Mobility: Walking and moving around   Mobility: Walking and Moving Around Current Status [phone]) At least 40 percent but less than 60 percent impaired, limited or restricted   Mobility: Walking and Moving Around Goal Status [phone]) At least 20 percent but less than 40 percent impaired, limited or restricted      Problem List Patient Active Problem List   Diagnosis Date Noted  . Abnormality of gait 09/13/2013  . Jaw pain 09/10/2013  . Post-menopause 09/10/2013  . Depression 09/19/2011  . Swelling of left lower extremity 05/31/2011  . Multiple sclerosis 03/06/2011  . Healthcare maintenance 03/06/2011  . Cervical cancer screening 03/06/2011  . Elevated liver enzymes 03/06/2011  . Hyperlipidemia 09/17/2010                                           Kerry Fort, PT [facility] [address] [place], [place] [address] [phone]     Kary Kos 12/18/13, 3:14  PM

## 2013-12-08 ENCOUNTER — Ambulatory Visit: Payer: Medicare Other | Admitting: Physical Therapy

## 2013-12-08 ENCOUNTER — Encounter: Payer: Self-pay | Admitting: Physical Therapy

## 2013-12-08 DIAGNOSIS — M6281 Muscle weakness (generalized): Secondary | ICD-10-CM | POA: Diagnosis not present

## 2013-12-08 DIAGNOSIS — R269 Unspecified abnormalities of gait and mobility: Secondary | ICD-10-CM

## 2013-12-08 DIAGNOSIS — G35 Multiple sclerosis: Secondary | ICD-10-CM | POA: Diagnosis not present

## 2013-12-08 DIAGNOSIS — R29898 Other symptoms and signs involving the musculoskeletal system: Secondary | ICD-10-CM

## 2013-12-08 NOTE — Therapy (Signed)
Physical Therapy Treatment  Patient Details  Name: Kristen Boyle MRN: [id number] Date of Birth: Sep 06, 1960  Encounter Date: 12/08/2013      PT End of Session - 12/08/13 1238    Visit Number 11   Number of Visits 17   Date for PT Re-Evaluation 12/17/13   PT Start Time 1150   PT Stop Time 1234   PT Time Calculation (min) 44 min      Past Medical History  Diagnosis Date  . MS (multiple sclerosis)   . Hyperlipidemia   . Depression   . Abnormality of gait 06/10/2012    Past Surgical History  Procedure Laterality Date  . Tubal ligation Bilateral     There were no vitals taken for this visit.  Visit Diagnosis:  Abnormality of gait  Left leg weakness      Subjective Assessment - 12/08/13 1156    Symptoms Did not call [facility] yet about brace.  Started her new medicine today.   Currently in Pain? No/denies            [facility] Adult PT Treatment/Exercise - 12/08/13 1208    Ambulation/Gait   Ambulation/Gait Yes   Ambulation/Gait Assistance 4: Min guard   Ambulation Distance (Feet) 85 Feet  x 2   Assistive device Straight cane   Gait Pattern Decreased dorsiflexion - left   Lumbar Exercises: Supine   Heel Slides 10 reps;Other (comment)  L LE with red theraband   Heel Slides Limitations weakness of left hip flexors   Bridge 10 reps   Straight Leg Raise 5 reps   Straight Leg Raises Limitations weakness in L hip flexors  only able to actively raise 3"   Knee/Hip Exercises: Seated   Long Arc Quad AROM;Left;10 reps;Other (comment)  red theraband   Heel Slides AROM;Left;10 reps;Other (comment)  red theraband   Knee/Hip Exercises: Supine   Short Arc Quad Sets Left;10 reps;AROM   Other Supine Knee Exercises --  hip abd on L in R sidelying x 10   Knee/Hip Exercises: Sidelying   Hip ABduction AROM;Left;10 reps   Hip ABduction Limitations limited to 4" abduction          PT Education - 12/08/13 1238    Education provided Yes   Education Details Reviewed  HEP with red theraband and provided red theraband   Person(s) Educated Patient   Methods Explanation;Demonstration   Comprehension Verbalized understanding          PT Short Term Goals - 12/07/13 1458    PT SHORT TERM GOAL #1   Title same as LTG's          PT Long Term Goals - 12/07/13 1459    PT LONG TERM GOAL #1   Title Verbalize understanding of health promotion/wellness opportunities   Baseline 12-17-13   Time 2   Period Weeks   Status On-going   PT LONG TERM GOAL #2   Title Improve Berg balance test score to >/= 41/56 to reduce fall risk   Baseline 12-17-13   Time 2   Period Weeks   Status On-going   PT LONG TERM GOAL #3   Title Improve TUG score to </= 14.5 secs without cane with AFO   Baseline 12-17-13   Time 2   Period Weeks   Status On-going   PT LONG TERM GOAL #4   Title Independently don and doff AFO for LLE   Baseline 12-17-13   Time 2   Period Weeks   Status On-going  PT LONG TERM GOAL #5   Title Incr. gait velocity to >/= 2.0 ft/sec with cane with orthosis on LLE   Baseline 12-17-13   Time 2   Period Weeks   Status On-going   Additional Long Term Goals   Additional Long Term Goals Yes   PT LONG TERM GOAL #6   Title Incr. FOTO score to >/= 50   Baseline 12-17-13   Time 2   Period Weeks   Status On-going          Plan - 12/08/13 1239    Clinical Impression Statement Contacted [facility] who will deliver AFO to clinic during next therapy session.     Pt will benefit from skilled therapeutic intervention in order to improve on the following deficits Abnormal gait;Decreased activity tolerance;Decreased strength;Decreased mobility;Decreased coordination   Rehab Potential Good   PT Frequency 2x / week   PT Duration 2 weeks   PT Treatment/Interventions Stair training;Neuromuscular re-education;Balance training;Gait training;Therapeutic exercise;Therapeutic activities;Patient/family education   PT Next Visit Plan AFO fitting  with [facility], gait with AFO   Consulted and Agree with Plan of Care Patient        Problem List Patient Active Problem List   Diagnosis Date Noted  . Abnormality of gait 09/13/2013  . Jaw pain 09/10/2013  . Post-menopause 09/10/2013  . Depression 09/19/2011  . Swelling of left lower extremity 05/31/2011  . Multiple sclerosis 03/06/2011  . Healthcare maintenance 03/06/2011  . Cervical cancer screening 03/06/2011  . Elevated liver enzymes 03/06/2011  . Hyperlipidemia 09/17/2010     Newell Coralobertson, Denise Terry 12/08/2013, 12:41 PM     Newell Coralobertson, Denise Terry, PTA 12:41 PM

## 2013-12-13 ENCOUNTER — Ambulatory Visit: Payer: Medicare Other | Admitting: Physical Therapy

## 2013-12-13 ENCOUNTER — Encounter: Payer: Self-pay | Admitting: Physical Therapy

## 2013-12-13 DIAGNOSIS — R29898 Other symptoms and signs involving the musculoskeletal system: Secondary | ICD-10-CM

## 2013-12-13 DIAGNOSIS — M6281 Muscle weakness (generalized): Secondary | ICD-10-CM | POA: Diagnosis not present

## 2013-12-13 DIAGNOSIS — G35 Multiple sclerosis: Secondary | ICD-10-CM | POA: Diagnosis not present

## 2013-12-13 DIAGNOSIS — R269 Unspecified abnormalities of gait and mobility: Secondary | ICD-10-CM | POA: Diagnosis not present

## 2013-12-13 NOTE — Therapy (Signed)
Physical Therapy Treatment  Patient Details  Name: Kristen Boyle MRN: [id number] Date of Birth: 02-10-1960  Encounter Date: 12/13/2013      PT End of Session - 12/13/13 1250    Visit Number 12   Number of Visits 17   Date for PT Re-Evaluation 12/17/13   PT Start Time 1147   PT Stop Time 1233   PT Time Calculation (min) 46 min   Activity Tolerance Patient limited by fatigue      Past Medical History  Diagnosis Date  . MS (multiple sclerosis)   . Hyperlipidemia   . Depression   . Abnormality of gait 06/10/2012    Past Surgical History  Procedure Laterality Date  . Tubal ligation Bilateral     There were no vitals taken for this visit.  Visit Diagnosis:  Abnormality of gait  Left leg weakness      Subjective Assessment - 12/13/13 1150    Symptoms Denies changes. Does feel nauseous this am.  Questions if this is her new medicine.   Currently in Pain? No/denies           [facility] Adult PT Treatment/Exercise - 12/13/13 1157    Ambulation/Gait   Ambulation/Gait Yes   Ambulation/Gait Assistance 5: Supervision   Ambulation/Gait Assistance Details L knee hyperextension with gait   Ambulation Distance (Feet) 110 Feet  twice   Assistive device Straight cane   Gait Pattern Decreased dorsiflexion - left   Lumbar Exercises: Supine   Heel Slides 10 reps   Heel Slides Limitations weakness of left hip flexors   Bridge 10 reps   Straight Leg Raise 10 reps   Straight Leg Raises Limitations weakness in L hip flexors;only able to raise 3'"   Large Ball Abdominal Isometric 10 reps   Large Ball Abdominal Isometric Limitations on red therapy ball-needed assist to flex L knee   Other Supine Lumbar Exercises Bridging with hip abdct./adduction 5 reps;  bridging with marching 5 reps;  bridging with RLE extension 5 reps:    Other Supine Lumbar Exercises hip add with ball x 10   Knee/Hip Exercises: Aerobic   Stationary Bike Nustep level 1 all 4 extremities x 8 minutes    Knee/Hip Exercises: Seated   Long Arc Quad AROM;Left;Strengthening;10 reps  unable to tolerate resistance today of theraband   Heel Slides AROM;Left;10 reps;Other (comment)  unable to perform with resistance today   Knee/Hip Exercises: Supine   Quad Sets AROM;Left;10 reps   Short Arc Quad Sets AROM;Left;10 reps;2 sets          PT Education - 12/13/13 1250    Education provided Yes   Education Details delay in brace from [facility]   Person(s) Educated Patient   Methods Explanation   Comprehension Verbalized understanding            Plan - 12/13/13 1252    Clinical Impression Statement [facility] was scheduled to deliver L AFO today but stated they were still awaiting authorization from Medicaid.  Now scheduled to deliver brace here to clinic on 12/3 11:00 appt.  Pt limited by nausea today.  Discussed calling pharmacy to see if this could be a side effect of new medication.   Pt will benefit from skilled therapeutic intervention in order to improve on the following deficits Abnormal gait;Decreased activity tolerance;Decreased strength;Decreased mobility;Decreased coordination   Rehab Potential Good   PT Treatment/Interventions Stair training;Neuromuscular re-education;Balance training;Gait training;Therapeutic exercise;Therapeutic activities;Patient/family education   PT Next Visit Plan Begin checking goals and recertify per  Kerry FortSuzanne Dilday, PT.   Consulted and Agree with Plan of Care Patient      Problem List Patient Active Problem List   Diagnosis Date Noted  . Abnormality of gait 09/13/2013  . Jaw pain 09/10/2013  . Post-menopause 09/10/2013  . Depression 09/19/2011  . Swelling of left lower extremity 05/31/2011  . Multiple sclerosis 03/06/2011  . Healthcare maintenance 03/06/2011  . Cervical cancer screening 03/06/2011  . Elevated liver enzymes 03/06/2011  . Hyperlipidemia 09/17/2010     Newell Coralenise Terry Coltrane Tugwell, PTA [facility] 12/13/2013 1:15 PM Phone: [phone] Fax: [phone]

## 2013-12-14 ENCOUNTER — Ambulatory Visit: Payer: Medicare Other | Admitting: Physical Therapy

## 2013-12-14 ENCOUNTER — Encounter: Payer: Self-pay | Admitting: Physical Therapy

## 2013-12-14 DIAGNOSIS — G35 Multiple sclerosis: Secondary | ICD-10-CM | POA: Diagnosis not present

## 2013-12-14 DIAGNOSIS — R269 Unspecified abnormalities of gait and mobility: Secondary | ICD-10-CM | POA: Diagnosis not present

## 2013-12-14 DIAGNOSIS — M6281 Muscle weakness (generalized): Secondary | ICD-10-CM | POA: Diagnosis not present

## 2013-12-14 DIAGNOSIS — R29898 Other symptoms and signs involving the musculoskeletal system: Secondary | ICD-10-CM

## 2013-12-14 NOTE — Therapy (Signed)
Physical Therapy Treatment  Patient Details  Name: Kristen ClicheJacqueline A Beza MRN: [id number] Date of Birth: [birth year]  Encounter Date: 12/14/2013      PT End of Session - 12/14/13 1248    Visit Number 13   Number of Visits 17   Date for PT Re-Evaluation 12/17/13   PT Start Time 1145   PT Stop Time 1235   PT Time Calculation (min) 50 min   Activity Tolerance Patient limited by fatigue      Past Medical History  Diagnosis Date  . MS (multiple sclerosis)   . Hyperlipidemia   . Depression   . Abnormality of gait 06/10/2012    Past Surgical History  Procedure Laterality Date  . Tubal ligation Bilateral     There were no vitals taken for this visit.  Visit Diagnosis:  Abnormality of gait  Left leg weakness      Subjective Assessment - 12/14/13 1202    Symptoms pt. reports feeling better this morning - not nauseaus like she was yesterday   Pertinent History Pt. was diagnosed with MS in 1991   Currently in Pain? No/denies            [facility] Adult PT Treatment/Exercise - 12/14/13 1205    Exercises   Exercises Knee/Hip   Lumbar Exercises: Supine   Clam 10 reps  LLE   Heel Slides 10 reps   Heel Slides Limitations weakness in left hip flexors   Bridge 10 reps   Straight Leg Raise 10 reps   Straight Leg Raises Limitations weakness in left hip flexors   Other Supine Lumbar Exercises bridging with hip abdct./adduction x 10 reps: bridging with marching x 5 reps:  bridging with RLE extension x 5 reps: bridging with LLE extension x 5 reps:     Other Supine Lumbar Exercises left hip extension control off side of mat x 10 reps with mod assist for flexion back onto mat   Knee/Hip Exercises: Aerobic   Stationary Bike Nustep level 2 x 5"   Knee/Hip Exercises: Seated   Long Arc Quad AROM;10 reps  with yellow theraband   Heel Slides AROM;10 reps  mod assist given with reps 7-10   Other Seated Knee Exercises seated left knee flexion with yellow theraband x 10 reps   Knee/Hip  Exercises: Sidelying   Hip ABduction AROM;10 reps  LLE with yellow theraband in hooklying          PT Education - 12/14/13 1247    Education Details pt. was given yellow theraband for LAQ's and left knee flexion for HEP   Person(s) Educated Patient   Methods Demonstration   Comprehension Returned demonstration              Plan - 12/14/13 1249    Clinical Impression Statement pt. feeling better today but demonstrating significant weakness in left hamstrings - gave yellow theraband to use for HEP due to the red theraband being too difficult for pt. to use today   Pt will benefit from skilled therapeutic intervention in order to improve on the following deficits Abnormal gait;Decreased activity tolerance;Decreased strength;Decreased mobility;Decreased coordination   Rehab Potential Good   PT Frequency 2x / week   PT Duration --  1 week   PT Treatment/Interventions Stair training;Neuromuscular re-education;Balance training;Gait training;Therapeutic exercise;Therapeutic activities;Patient/family education   PT Next Visit Plan check LTG's and complete renewal   Consulted and Agree with Plan of Care Patient        Problem List Patient Active Problem List  Diagnosis Date Noted  . Abnormality of gait 09/13/2013  . Jaw pain 09/10/2013  . Post-menopause 09/10/2013  . Depression 09/19/2011  . Swelling of left lower extremity 05/31/2011  . Multiple sclerosis 03/06/2011  . Healthcare maintenance 03/06/2011  . Cervical cancer screening 03/06/2011  . Elevated liver enzymes 03/06/2011  . Hyperlipidemia 09/17/2010                                           Kerry Fort, PT [facility] [address] [place], [place] [address] [phone]     Kary Kos 12/14/2013, 12:55 PM

## 2013-12-21 ENCOUNTER — Ambulatory Visit: Payer: Medicare Other | Admitting: Physical Therapy

## 2013-12-23 ENCOUNTER — Ambulatory Visit: Payer: Medicare Other | Attending: Neurology | Admitting: Physical Therapy

## 2013-12-23 ENCOUNTER — Encounter: Payer: Self-pay | Admitting: Physical Therapy

## 2013-12-23 DIAGNOSIS — G35 Multiple sclerosis: Secondary | ICD-10-CM | POA: Diagnosis not present

## 2013-12-23 DIAGNOSIS — R29898 Other symptoms and signs involving the musculoskeletal system: Secondary | ICD-10-CM

## 2013-12-23 DIAGNOSIS — M6281 Muscle weakness (generalized): Secondary | ICD-10-CM | POA: Insufficient documentation

## 2013-12-23 DIAGNOSIS — R269 Unspecified abnormalities of gait and mobility: Secondary | ICD-10-CM | POA: Insufficient documentation

## 2013-12-23 NOTE — Therapy (Signed)
[facility] [address] [place], [place], [address] Phone: [phone]   Fax:  [phone]  Physical Therapy Treatment  Patient Details  Name: Kristen Boyle MRN: [id number] Date of Birth: 1960-07-04  Encounter Date: 12/23/2013      PT End of Session - 12/23/13 1230    Visit Number 14   Number of Visits 17   Date for PT Re-Evaluation 01/23/14   PT Start Time 1100   PT Stop Time 1145   PT Time Calculation (min) 45 min      Past Medical History  Diagnosis Date  . MS (multiple sclerosis)   . Hyperlipidemia   . Depression   . Abnormality of gait 06/10/2012    Past Surgical History  Procedure Laterality Date  . Tubal ligation Bilateral     There were no vitals taken for this visit.  Visit Diagnosis:  Abnormality of gait - Plan: PT plan of care cert/re-cert  Left leg weakness - Plan: PT plan of care cert/re-cert      Subjective Assessment - 12/23/13 1146    Symptoms Pt. states she brought larger shoes for her brace which is being fitted today   Pertinent History Pt. was diagnosed with MS in 1991   Currently in Pain? No/denies            [facility] Adult PT Treatment/Exercise - 12/23/13 1123    Ambulation/Gait   Ambulation/Gait Yes   Ambulation/Gait Assistance 5: Supervision  CGA at times   Ambulation/Gait Assistance Details Orthotist, Thayer Ohm from [employer] [facility] [employer] brought [employer]; trialed [employer] which was significantly better with controlling left knee hyperextension in stance    Ambulation Distance (Feet) 120 Feet  2 reps; with [place] walk-on flex and with [place] walk-on   Assistive device Straight cane   Gait Pattern Decreased hip/knee flexion - left;Left genu recurvatum   Stairs Yes   Stairs Assistance 5: Supervision   Stair Management Technique One rail Left;With cane   Ramp Other (comment)  CGA   Curb 4: Min assist  with cane   Curb Details (indicate cue  type and reason) cues for sequence with cane   Knee/Hip Exercises: Aerobic   Stationary Bike 5" scifit level 1.5     Heel lift was placed in left shoe under insole with use of walk-on flex AFO but this caused pt. to be more unstable and off balance - pt. preferred walk-on AFO.         PT Long Term Goals - 12/23/13 1236    PT LONG TERM GOAL #1   Title Verbalize understanding of health promotion/wellness opportunities   Baseline 01-22-14   Time 4   Period Weeks   Status On-going   PT LONG TERM GOAL #2   Title Improve Berg balance test score to >/= 41/56 to reduce fall risk   Baseline 01-22-14   Time 4   Period Weeks   Status On-going   PT LONG TERM GOAL #3   Title Improve TUG score to </= 14.5 secs without cane with AFO   Baseline AFO not yet obtained   Time 4   Period Weeks   Status On-going   PT LONG TERM GOAL #4   Title Independently don and doff AFO for LLE   Baseline 01-22-14   Time 4   Period Weeks   Status On-going          Plan - 12/23/13 1234    Clinical Impression Statement Ott[place]ock walk-on  AFO provides better clearance and left knee control than walk-on flex [place] AFO: orthotist to order walk-on AFO   Pt will benefit from skilled therapeutic intervention in order to improve on the following deficits Abnormal gait;Decreased activity tolerance;Decreased strength;Decreased mobility;Decreased coordination   Rehab Potential Good   PT Frequency 2x / week   PT Duration 4 weeks   PT Treatment/Interventions Stair training;Neuromuscular re-education;Balance training;Gait training;Therapeutic exercise;Therapeutic activities;Patient/family education   Consulted and Agree with Plan of Care Patient                               Problem List Patient Active Problem List   Diagnosis Date Noted  . Abnormality of gait 09/13/2013  . Jaw pain 09/10/2013  . Post-menopause 09/10/2013  . Depression 09/19/2011  . Swelling of left lower extremity  05/31/2011  . Multiple sclerosis 03/06/2011  . Healthcare maintenance 03/06/2011  . Cervical cancer screening 03/06/2011  . Elevated liver enzymes 03/06/2011  . Hyperlipidemia 09/17/2010  Kerry FortSuzanne Emerald Gehres, PT [facility] [address] [place], [place] [address] [phone]   Kary KosDilday, Darneisha Windhorst Suzanne 12/23/2013, 12:56 PM

## 2013-12-23 NOTE — Patient Instructions (Signed)
Instructed pt. to purchase 1/2 size larger shoes to accomodate new Ottobock walk-on AFO; pt. Verbalized understanding

## 2013-12-27 ENCOUNTER — Encounter: Payer: Self-pay | Admitting: Physical Therapy

## 2013-12-27 ENCOUNTER — Ambulatory Visit: Payer: Medicare Other | Admitting: Physical Therapy

## 2013-12-27 DIAGNOSIS — G35 Multiple sclerosis: Secondary | ICD-10-CM | POA: Diagnosis not present

## 2013-12-27 DIAGNOSIS — R29898 Other symptoms and signs involving the musculoskeletal system: Secondary | ICD-10-CM

## 2013-12-27 DIAGNOSIS — R269 Unspecified abnormalities of gait and mobility: Secondary | ICD-10-CM

## 2013-12-27 NOTE — Therapy (Signed)
[facility] [address] [place], [place], [address] Phone: [phone]   Fax:  [phone]  Physical Therapy Treatment  Patient Details  Name: Kristen Boyle MRN: [id number] Date of Birth: 11-Jan-1961  Encounter Date: 12/27/2013      PT End of Session - 12/27/13 2028    Visit Number 15   Number of Visits 17   Date for PT Re-Evaluation 01/23/14   PT Start Time 1147   PT Stop Time 1232   PT Time Calculation (min) 45 min   Activity Tolerance Patient limited by fatigue      Past Medical History  Diagnosis Date  . MS (multiple sclerosis)   . Hyperlipidemia   . Depression   . Abnormality of gait 06/10/2012    Past Surgical History  Procedure Laterality Date  . Tubal ligation Bilateral     There were no vitals taken for this visit.  Visit Diagnosis:  No diagnosis found.      Subjective Assessment - 12/27/13 2023    Symptoms Pt. states she did alot of chores over the weekend - had some left knee pain on Saturday and Sunday   Pertinent History Pt. was diagnosed with MS in 1991   Currently in Pain? No/denies            [facility] Adult PT Treatment/Exercise - 12/27/13 1205    Ambulation/Gait   Ambulation/Gait Yes   Ambulation/Gait Assistance 5: Supervision  occasional CGA   Ambulation/Gait Assistance Details AFO delivered by [facility] for LLE; 1/4" heel wedge used under brace to decr. genu recurvatum   Ambulation Distance (Feet) 360 Feet   Assistive device Straight cane  120' with no device with CGA   Gait Pattern Decreased step length - left;Decreased dorsiflexion - left;Decreased hip/knee flexion - left    Trialed both pairs of tennis shoes with AFO in L shoe.  Instructed in progressive wear time of AFO.      PT Education - 12/27/13 2027    Education provided Yes   Education Details Instructed pt. to wear AFO 2 hours today and then increase by 1 hour starting tomorrow   Person(s) Educated Patient   Methods Explanation   Comprehension Verbalized understanding              Plan - 12/27/13 2030    Clinical Impression Statement AFO improves L foot clearance in swing phase of gait but deviatons increase with fatigue; some recurvatum persists, despite use of heel wedge - has varus moment in stance   Pt will benefit from skilled therapeutic intervention in order to improve on the following deficits Abnormal gait;Decreased activity tolerance;Decreased strength;Decreased mobility;Decreased coordination   Rehab Potential Good   PT Frequency 2x / week   PT Duration 4 weeks   PT Treatment/Interventions Stair training;Neuromuscular re-education;Balance training;Gait training;Therapeutic exercise;Therapeutic activities;Patient/family education   PT Next Visit Plan continue orthotic training; LLE strengthening                               Problem List Patient Active Problem List   Diagnosis Date Noted  . Abnormality of gait 09/13/2013  . Jaw pain 09/10/2013  . Post-menopause 09/10/2013  . Depression 09/19/2011  . Swelling of left lower extremity 05/31/2011  . Multiple sclerosis 03/06/2011  . Healthcare maintenance 03/06/2011  . Cervical cancer screening 03/06/2011  . Elevated liver enzymes 03/06/2011  . Hyperlipidemia 09/17/2010    DildayDonavan Burnet 12/27/2013,  8:35 PM  Kerry FortSuzanne Wesleigh Markovic, PT [facility] [address] [place], [place] [address] [phone]

## 2013-12-29 ENCOUNTER — Encounter: Payer: Self-pay | Admitting: Physical Therapy

## 2013-12-29 ENCOUNTER — Ambulatory Visit: Payer: Medicare Other | Admitting: Physical Therapy

## 2013-12-29 DIAGNOSIS — R29898 Other symptoms and signs involving the musculoskeletal system: Secondary | ICD-10-CM

## 2013-12-29 DIAGNOSIS — G35 Multiple sclerosis: Secondary | ICD-10-CM | POA: Diagnosis not present

## 2013-12-29 DIAGNOSIS — R269 Unspecified abnormalities of gait and mobility: Secondary | ICD-10-CM

## 2013-12-29 NOTE — Therapy (Signed)
[facility] [address] [place], [place], [address] Phone: [phone]   Fax:  [phone]  Physical Therapy Treatment  Patient Details  Name: Kristen Boyle MRN: [id number] Date of Birth: Jul 24, 1960  Encounter Date: 12/29/2013      PT End of Session - 12/29/13 1154    Visit Number 16   Number of Visits 25   Date for PT Re-Evaluation 01/23/14   PT Start Time 1055   PT Stop Time 1147   PT Time Calculation (min) 52 min   Activity Tolerance Patient limited by fatigue      Past Medical History  Diagnosis Date  . MS (multiple sclerosis)   . Hyperlipidemia   . Depression   . Abnormality of gait 06/10/2012    Past Surgical History  Procedure Laterality Date  . Tubal ligation Bilateral     There were no vitals taken for this visit.  Visit Diagnosis:  Left leg weakness  Abnormality of gait      Subjective Assessment - 12/29/13 1117    Symptoms Wore brace for 2 hours yesterday while cooking and make plantar surface sore but denies soreness at present.  Pt wearing L AFO upon arrival to clinic.  Is nauseated from taking meds without food before coming today.   Pertinent History Pt. was diagnosed with MS in 1991   Currently in Pain? No/denies          [facility] - 12/29/13 0001    Transfers   Transfers Sit to Stand;Stand to Sit   Sit to Stand 6: Modified independent (Device/Increase time);With upper extremity assist;From chair/3-in-1   Stand to Sit 6: Modified independent (Device/Increase time);With upper extremity assist;To chair/3-in-1   Ambulation/Gait   Ambulation/Gait Yes   Ambulation/Gait Assistance 5: Supervision   Ambulation/Gait Assistance Details L AFO with 1/4 inch heel wedge   Ambulation Distance (Feet) 120 Feet  100' x 2 also   Assistive device Straight cane   Gait Pattern Decreased step length - left;Decreased hip/knee flexion - left;Left genu recurvatum;Abducted - left   Left knee varus at times with stance   Knee/Hip Exercises: Standing   Other Standing Knee Exercises Standing in parallel bars for RLE hamstring curl, hip abd, hip extension, marching x 10 AROM.  RLE hip abd x 10.  Pt able to weightbear on LLE in standing to perform on RLE with UE support.   Knee/Hip Exercises: Seated   Heel Slides AROM;Left;10 reps;Other (comment)  yellow theraband for resistance          PT Education - 12/29/13 1154    Education provided Yes   Education Details Skin inspection after wearing L AFO and wear schedule   Person(s) Educated Patient   Methods Explanation   Comprehension Verbalized understanding           Plan - 12/29/13 1159    Clinical Impression Statement Pt continues with gait deviations with L AFO secondary to changing mechanism of gait.  Pt motivated to improve mobility and wanting to be able to ambulate without cane.  Pt reports wearing AFO 2 hours yesterday and is planning on trying for 3 hours today.    Pt will benefit from skilled therapeutic intervention in order to improve on the following deficits Abnormal gait;Decreased activity tolerance;Decreased strength;Decreased mobility;Decreased coordination   Rehab Potential Good   PT Frequency 2x / week   PT Duration 4 weeks   PT Treatment/Interventions Stair training;Neuromuscular re-education;Balance training;Gait training;Therapeutic exercise;Therapeutic activities;Patient/family education   PT  Next Visit Plan continue orthotic training; LLE strengthening; provide standing HEP at counter   Consulted and Agree with Plan of Care Patient       Problem List Patient Active Problem List   Diagnosis Date Noted  . Abnormality of gait 09/13/2013  . Jaw pain 09/10/2013  . Post-menopause 09/10/2013  . Depression 09/19/2011  . Swelling of left lower extremity 05/31/2011  . Multiple sclerosis 03/06/2011  . Healthcare maintenance 03/06/2011  . Cervical cancer screening 03/06/2011  . Elevated  liver enzymes 03/06/2011  . Hyperlipidemia 09/17/2010    Kristen Boyle 12/29/2013, 12:04 PM     Kristen Boyle, PTA [facility] 12/29/2013 12:04 PM Phone: [phone] Fax: [phone]

## 2014-01-03 ENCOUNTER — Ambulatory Visit: Payer: Medicare Other | Admitting: Physical Therapy

## 2014-01-06 ENCOUNTER — Ambulatory Visit: Payer: Medicare Other | Admitting: Physical Therapy

## 2014-01-06 ENCOUNTER — Encounter: Payer: Self-pay | Admitting: Physical Therapy

## 2014-01-06 DIAGNOSIS — G35 Multiple sclerosis: Secondary | ICD-10-CM | POA: Diagnosis not present

## 2014-01-06 DIAGNOSIS — R269 Unspecified abnormalities of gait and mobility: Secondary | ICD-10-CM

## 2014-01-06 DIAGNOSIS — R29898 Other symptoms and signs involving the musculoskeletal system: Secondary | ICD-10-CM

## 2014-01-06 NOTE — Therapy (Signed)
[facility] [address] [place], [place], [address] Phone: [phone]   Fax:  [phone]  Physical Therapy Treatment  Patient Details  Name: Kristen Boyle MRN: [id number] Date of Birth: 06-19-1960  Encounter Date: 01/06/2014      PT End of Session - 01/06/14 1157    Visit Number 17   Number of Visits 25   Date for PT Re-Evaluation 01/23/14   PT Start Time 1058   PT Stop Time 1137   PT Time Calculation (min) 39 min      Past Medical History  Diagnosis Date  . MS (multiple sclerosis)   . Hyperlipidemia   . Depression   . Abnormality of gait 06/10/2012    Past Surgical History  Procedure Laterality Date  . Tubal ligation Bilateral     There were no vitals taken for this visit.  Visit Diagnosis:  Left leg weakness  Abnormality of gait      Subjective Assessment - 01/06/14 1147    Symptoms Pt states she is not moving around as much at home due to living situation-pt lives with daughter and boyfriend but boyfriends son recently moved in as well and she does not feel comfortable coming out of her room often and spends most of her time in her room.  Pt denies falls or changes since last visit.   Currently in Pain? No/denies            [facility] Adult PT Treatment/Exercise - 01/06/14 1153    Transfers   Transfers Sit to Stand;Stand to Sit   Sit to Stand 6: Modified independent (Device/Increase time);With upper extremity assist;From chair/3-in-1   Stand to Sit 6: Modified independent (Device/Increase time);With upper extremity assist;To chair/3-in-1   Ambulation/Gait   Ambulation/Gait Yes   Ambulation/Gait Assistance 5: Supervision   Ambulation/Gait Assistance Details Pt wearing L ottobach AFO and continues with knee hyperextension.  Trialed L blue rocker with wedge and continues with L knee hyperextension.   Ambulation Distance (Feet) 120 Feet  5   Assistive device Straight cane;Rolling walker;Rollator   trialed rolling walker and rollator.  Pt prefers RW.   Gait Pattern Decreased step length - left;Left genu recurvatum;Decreased dorsiflexion - left          PT Education - 01/06/14 1156    Education provided Yes   Education Details Benefit of RW vs furniture walking with cane   Person(s) Educated Patient   Methods Explanation   Comprehension Verbalized understanding          PT Short Term Goals - 01/06/14 1200    PT SHORT TERM GOAL #1   Title same as LTG's          PT Long Term Goals - 01/06/14 1200    PT LONG TERM GOAL #1   Title Verbalize understanding of health promotion/wellness opportunities   Baseline 01-22-14   Time 4   Period Weeks   Status On-going   PT LONG TERM GOAL #2   Title Improve Berg balance test score to >/= 41/56 to reduce fall risk   Baseline 01-22-14   Time 4   Period Weeks   Status On-going   PT LONG TERM GOAL #3   Title Improve TUG score to </= 14.5 secs without cane with AFO   Time 4   Period Weeks   Status On-going   PT LONG TERM GOAL #4   Title Independently don and doff AFO for LLE   Baseline 01-22-14   Time 4  Period Weeks   Status On-going   PT LONG TERM GOAL #5   Title Incr. gait velocity to >/= 2.0 ft/sec with cane with orthosis on LLE   Status On-going   PT LONG TERM GOAL #6   Title Incr. FOTO score to >/= 50   Status On-going          Plan - 01/06/14 1158    Clinical Impression Statement Pt continues with gait deviations with L AFO.  Reports decreased mobility due to living situaiton and decreased balance.  Trialed RW and Rollator for balance.  Pt prefers RW and feels it would help her feel more secure.     Pt will benefit from skilled therapeutic intervention in order to improve on the following deficits Abnormal gait;Decreased activity tolerance;Decreased strength;Decreased mobility;Decreased coordination   Rehab Potential Good   PT Frequency 2x / week   PT Duration 4 weeks   PT Treatment/Interventions Stair  training;Neuromuscular re-education;Balance training;Gait training;Therapeutic exercise;Therapeutic activities;Patient/family education   PT Next Visit Plan Continue gait with RW and orthotic training.  Standing HEP.   Consulted and Agree with Plan of Care Patient        Problem List Patient Active Problem List   Diagnosis Date Noted  . Abnormality of gait 09/13/2013  . Jaw pain 09/10/2013  . Post-menopause 09/10/2013  . Depression 09/19/2011  . Swelling of left lower extremity 05/31/2011  . Multiple sclerosis 03/06/2011  . Healthcare maintenance 03/06/2011  . Cervical cancer screening 03/06/2011  . Elevated liver enzymes 03/06/2011  . Hyperlipidemia 09/17/2010    Newell Coralobertson, Denise Terry 01/06/2014, 12:03 PM     Newell Coralenise Terry Robertson, PTA [facility] 01/06/2014 12:03 PM Phone: [phone] Fax: [phone]

## 2014-01-10 ENCOUNTER — Ambulatory Visit: Payer: Medicare Other | Admitting: Physical Therapy

## 2014-01-10 ENCOUNTER — Encounter: Payer: Self-pay | Admitting: Physical Therapy

## 2014-01-10 DIAGNOSIS — R29898 Other symptoms and signs involving the musculoskeletal system: Secondary | ICD-10-CM

## 2014-01-10 DIAGNOSIS — R269 Unspecified abnormalities of gait and mobility: Secondary | ICD-10-CM

## 2014-01-10 DIAGNOSIS — G35 Multiple sclerosis: Secondary | ICD-10-CM | POA: Diagnosis not present

## 2014-01-10 NOTE — Therapy (Signed)
[facility] [address] [place], [place], [address] Phone: [phone]   Fax:  [phone]  Physical Therapy Treatment  Patient Details  Name: Kristen ClicheJacqueline A Cerra MRN: [id number] Date of Birth: 1961-01-20  Encounter Date: 01/10/2014      PT End of Session - 01/10/14 1410    Visit Number 18   Number of Visits 25   Date for PT Re-Evaluation 01/23/14   PT Start Time 1148   PT Stop Time 1236   PT Time Calculation (min) 48 min      Past Medical History  Diagnosis Date  . MS (multiple sclerosis)   . Hyperlipidemia   . Depression   . Abnormality of gait 06/10/2012    Past Surgical History  Procedure Laterality Date  . Tubal ligation Bilateral     There were no vitals taken for this visit.  Visit Diagnosis:  Left leg weakness  Abnormality of gait      Subjective Assessment - 01/10/14 1257    Symptoms Pt. reports brace is helping with left foot clearance but heel wedge feels that it is out of place in her shoe now; reports continuing to get fatigued easily   Pertinent History Pt. was diagnosed with MS in 1991   Currently in Pain? No/denies                    [facility]/Exercise - 01/10/14 1301    Ambulation/Gait   Ambulation/Gait Yes   Ambulation/Gait Assistance 5: Supervision   Ambulation/Gait Assistance Details --  pt. wearing L AFO with heel wedge to control genu recurvatum   Ambulation Distance (Feet) 120 Feet   Assistive device Straight cane   Gait Pattern Decreased weight shift to left;Decreased step length - left  left genu recurvatum   Ramp 4: Min assist  cane used for negotiation   Curb 4: Min assist  mod assist initially; cane used   Curb Details (indicate cue type and reason) verbal cues for correct sequence with descension and placement of R foot    High Level Balance   High Level Balance Comments tap ups to balance beam with 1 UE support x 10 reps and then 10  reps without UE assist   Exercises   Exercises Lumbar   Lumbar Exercises: Aerobic   Stationary Bike Nustep level 4 x 5"    Lumbar Exercises: Standing   Other Standing Lumbar Exercises 3# weight used for left hip abduction, flexion, and extension 10 reps each    Knee/Hip Exercises: Standing   Forward Step Up Left;10 reps;Step Height: 6"  tactile cues/min. assist to prevent hyperextension   Knee/Hip Exercises: Seated   Other Seated Knee Exercises seated left hamstring exercise with red theraband x 10 reps     Neuro Re-ed:  Stepping over and back of balance beam with minimal UE support x 10 reps LLE.  Alternate tap ups to balance beam x 10 reps with minimal UE support with CGA   Curb negotiation x 3 reps with min to CGA:  Step negotiation with rail with CGA with cues for sequence            PT Short Term Goals - 01/10/14 1416    PT SHORT TERM GOAL #1   Title same as LTG's           PT Long Term Goals - 01/06/14 1200    PT LONG TERM GOAL #1   Title Verbalize understanding of health  promotion/wellness opportunities   Baseline 01-22-14   Time 4   Period Weeks   Status On-going   PT LONG TERM GOAL #2   Title Improve Berg balance test score to >/= 41/56 to reduce fall risk   Baseline 01-22-14   Time 4   Period Weeks   Status On-going   PT LONG TERM GOAL #3   Title Improve TUG score to </= 14.5 secs without cane with AFO   Time 4   Period Weeks   Status On-going   PT LONG TERM GOAL #4   Title Independently don and doff AFO for LLE   Baseline 01-22-14   Time 4   Period Weeks   Status On-going   PT LONG TERM GOAL #5   Title Incr. gait velocity to >/= 2.0 ft/sec with cane with orthosis on LLE   Status On-going   PT LONG TERM GOAL #6   Title Incr. FOTO score to >/= 50   Status On-going               Plan - 01/10/14 1411    Clinical Impression Statement Pt. is achieving left foot clearance in swing phase of gait with use of AFO, however, left knee continues to  hyperextend in stance phase of gait   Pt will benefit from skilled therapeutic intervention in order to improve on the following deficits Abnormal gait;Decreased activity tolerance;Decreased strength;Decreased mobility;Decreased coordination   Rehab Potential Good   PT Frequency 2x / week   PT Duration 4 weeks   PT Treatment/Interventions Stair training;Neuromuscular re-education;Balance training;Gait training;Therapeutic exercise;Therapeutic activities;Patient/family education   PT Next Visit Plan Trial RW for assist. with gait   Consulted and Agree with Plan of Care Patient        Problem List Patient Active Problem List   Diagnosis Date Noted  . Abnormality of gait 09/13/2013  . Jaw pain 09/10/2013  . Post-menopause 09/10/2013  . Depression 09/19/2011  . Swelling of left lower extremity 05/31/2011  . Multiple sclerosis 03/06/2011  . Healthcare maintenance 03/06/2011  . Cervical cancer screening 03/06/2011  . Elevated liver enzymes 03/06/2011  . Hyperlipidemia 09/17/2010    Kary Kos PT 01/10/2014, 2:20 PM  [facility] [address] [place], [place], [address] Phone: [phone]   Fax:  [phone]

## 2014-01-12 ENCOUNTER — Ambulatory Visit: Payer: Medicare Other | Admitting: Physical Therapy

## 2014-01-17 ENCOUNTER — Ambulatory Visit: Payer: Medicaid Other | Admitting: Adult Health

## 2014-01-19 ENCOUNTER — Encounter: Payer: Self-pay | Admitting: Adult Health

## 2014-01-24 ENCOUNTER — Other Ambulatory Visit: Payer: Self-pay | Admitting: *Deleted

## 2014-01-24 DIAGNOSIS — G35 Multiple sclerosis: Secondary | ICD-10-CM

## 2014-01-25 ENCOUNTER — Ambulatory Visit: Payer: Medicare Other | Attending: Neurology | Admitting: Physical Therapy

## 2014-01-25 ENCOUNTER — Encounter: Payer: Self-pay | Admitting: Physical Therapy

## 2014-01-25 DIAGNOSIS — R269 Unspecified abnormalities of gait and mobility: Secondary | ICD-10-CM | POA: Insufficient documentation

## 2014-01-25 DIAGNOSIS — G35 Multiple sclerosis: Secondary | ICD-10-CM | POA: Diagnosis not present

## 2014-01-25 DIAGNOSIS — R29898 Other symptoms and signs involving the musculoskeletal system: Secondary | ICD-10-CM

## 2014-01-25 DIAGNOSIS — M6281 Muscle weakness (generalized): Secondary | ICD-10-CM | POA: Insufficient documentation

## 2014-01-25 MED ORDER — OXYBUTYNIN CHLORIDE ER 10 MG PO TB24: 10.0000 mg | ORAL_TABLET | Freq: Every day | ORAL | Status: DC

## 2014-01-25 NOTE — Therapy (Signed)
[facility] [address] [place], [place], [address] Phone: [phone]   Fax:  [phone]  Physical Therapy Treatment  Patient Details  Name: Kristen Boyle MRN: [id number] Date of Birth: [birth year]  Encounter Date: 01/25/2014      PT End of Session - 01/25/14 1706    Visit Number 19   Number of Visits 25   Date for PT Re-Evaluation 01/30/14   PT Start Time 1102   PT Stop Time 1147   PT Time Calculation (min) 45 min      Past Medical History  Diagnosis Date  . MS (multiple sclerosis)   . Hyperlipidemia   . Depression   . Abnormality of gait 06/10/2012    Past Surgical History  Procedure Laterality Date  . Tubal ligation Bilateral     There were no vitals taken for this visit.  Visit Diagnosis:  Left leg weakness  Multiple sclerosis      Subjective Assessment - 01/25/14 1110    Symptoms Pt. reports wearing  L AFO 1/2 day now - getting more used to it   Pertinent History Pt. was diagnosed with MS in 1991   Currently in Pain? No/denies                    [facility] Adult PT Treatment/Exercise - 01/25/14 1138    Transfers   Transfers Sit to Stand   Sit to Stand 5: Supervision  right foot on balance bubble x 10 reps for LLE strengthening   Ambulation/Gait   Ambulation/Gait Yes   Ambulation/Gait Assistance 5: Supervision   Ambulation/Gait Assistance Details pt. wearing L AFO with heel wedge to control L genu recurvatum   Ambulation Distance (Feet) 240 Feet   Assistive device Straight cane   Gait Pattern Decreased weight shift to left;Decreased step length - left  left genu recurvatum   Stairs Yes   Stair Management Technique Step to pattern;One rail Left  ; cue to bend L knee to assist with clearance   Number of Stairs 4   Height of Stairs 6   Ramp 4: Min assist   Curb 4: Min assist  mod assist initially; cane used   Exercises   Exercises Knee/Hip   Lumbar Exercises: Aerobic   Stationary Bike Nustep level 3 x 10"   Lumbar Exercises: Seated   Other Seated Lumbar Exercises pt. performed left hip flexion, abduction, and extension with 2# weight on LLE x 10 reps   Knee/Hip Exercises: Standing   Terminal Knee Extension 10 reps;AROM;Theraband  left knee extension control   Theraband Level (Terminal Knee Extension) Level 4 (Blue)  left knee extension control exercise - bilateral, forward st                  PT Short Term Goals - 01/10/14 1416    PT SHORT TERM GOAL #1   Title same as LTG's           PT Long Term Goals - 01/06/14 1200    PT LONG TERM GOAL #1   Title Verbalize understanding of health promotion/wellness opportunities   Baseline 01-22-14   Time 4   Period Weeks   Status On-going   PT LONG TERM GOAL #2   Title Improve Berg balance test score to >/= 41/56 to reduce fall risk   Baseline 01-22-14   Time 4   Period Weeks   Status On-going   PT LONG TERM GOAL #3   Title Improve  TUG score to </= 14.5 secs without cane with AFO   Time 4   Period Weeks   Status On-going   PT LONG TERM GOAL #4   Title Independently don and doff AFO for LLE   Baseline 01-22-14   Time 4   Period Weeks   Status On-going   PT LONG TERM GOAL #5   Title Incr. gait velocity to >/= 2.0 ft/sec with cane with orthosis on LLE   Status On-going   PT LONG TERM GOAL #6   Title Incr. FOTO score to >/= 50   Status On-going               Plan - 01/25/14 1707    Clinical Impression Statement AFO is improving safety with gait by increasing L foot clearance in swing phase of gait; pt. able to amb. inside bars without UE support but states she is hesitant to amb. in home without cane due to fear of falling   Pt will benefit from skilled therapeutic intervention in order to improve on the following deficits Abnormal gait;Decreased activity tolerance;Decreased strength;Decreased mobility;Decreased coordination   Rehab Potential Good   PT Frequency 2x / week   PT  Duration 4 weeks   PT Treatment/Interventions Stair training;Neuromuscular re-education;Balance training;Gait training;Therapeutic exercise;Therapeutic activities;Patient/family education   PT Next Visit Plan Trial RW for assist. with gait; check LTG's and D/C    Consulted and Agree with Plan of Care Patient        Problem List Patient Active Problem List   Diagnosis Date Noted  . Abnormality of gait 09/13/2013  . Jaw pain 09/10/2013  . Post-menopause 09/10/2013  . Depression 09/19/2011  . Swelling of left lower extremity 05/31/2011  . Multiple sclerosis 03/06/2011  . Healthcare maintenance 03/06/2011  . Cervical cancer screening 03/06/2011  . Elevated liver enzymes 03/06/2011  . Hyperlipidemia 09/17/2010    Kary Kos, PT 01/25/2014, 5:12 PM  [facility] [address] [place], [place], [address] Phone: [phone]   Fax:  [phone]

## 2014-01-27 ENCOUNTER — Ambulatory Visit: Payer: Medicare Other | Admitting: Physical Therapy

## 2014-02-01 ENCOUNTER — Telehealth: Payer: Self-pay | Admitting: Neurology

## 2014-02-01 MED ORDER — INTERFERON BETA-1B 0.3 MG ~~LOC~~ KIT: 0.2500 mg | PACK | SUBCUTANEOUS | Status: DC

## 2014-02-01 NOTE — Telephone Encounter (Signed)
I called back.  Kristen Boyle is requesting a drug change from Betaseron to [place] due to current insurance formulary.  Both drugs are chemically the same.  They are requesting a new Rx be sent for Extavia.  Rx has been entered, forwarded to provider for review/approval.

## 2014-02-01 NOTE — Telephone Encounter (Signed)
Kristen Boyle is calling to state that Kristen Boyle is no longer preferred, the preferred drug now is Kristen Boyle and it is chemical identical.  Please call and advise.

## 2014-02-01 NOTE — Telephone Encounter (Signed)
I have no problem with using Extavia instead of Betaseron.

## 2014-02-11 ENCOUNTER — Encounter: Payer: Medicare Other | Admitting: Internal Medicine

## 2014-04-01 ENCOUNTER — Ambulatory Visit: Payer: Medicare Other | Admitting: Adult Health

## 2014-04-08 ENCOUNTER — Encounter: Payer: Self-pay | Admitting: Adult Health

## 2014-04-08 ENCOUNTER — Telehealth: Payer: Self-pay

## 2014-04-08 ENCOUNTER — Ambulatory Visit (INDEPENDENT_AMBULATORY_CARE_PROVIDER_SITE_OTHER): Payer: Medicare Other | Admitting: Adult Health

## 2014-04-08 VITALS — BP 134/85 | HR 75 | Temp 97.4°F | Ht 61.0 in | Wt 144.0 lb

## 2014-04-08 DIAGNOSIS — F329 Major depressive disorder, single episode, unspecified: Secondary | ICD-10-CM

## 2014-04-08 DIAGNOSIS — F32A Depression, unspecified: Secondary | ICD-10-CM

## 2014-04-08 DIAGNOSIS — G35 Multiple sclerosis: Secondary | ICD-10-CM

## 2014-04-08 DIAGNOSIS — Z5181 Encounter for therapeutic drug level monitoring: Secondary | ICD-10-CM

## 2014-04-08 DIAGNOSIS — R269 Unspecified abnormalities of gait and mobility: Secondary | ICD-10-CM | POA: Diagnosis not present

## 2014-04-08 MED ORDER — PAROXETINE HCL 20 MG PO TABS: 20.0000 mg | ORAL_TABLET | Freq: Every day | ORAL | Status: DC

## 2014-04-08 NOTE — Telephone Encounter (Signed)
Called and spoke to her PCP office Dr. Otis Brace. Patient has apt. 04-13-14 arrive at 3:15 pm. Patient is aware of apt. And time.

## 2014-04-08 NOTE — Progress Notes (Signed)
I have read the note, and I agree with the clinical assessment and plan.  WILLIS,CHARLES KEITH   

## 2014-04-08 NOTE — Patient Instructions (Signed)
Continue Betaseron I will check blood work today We will recheck MRI of the brain  I will refill paxil today- Follow-up with PCP for additional refills.

## 2014-04-08 NOTE — Progress Notes (Signed)
PATIENT: Kristen Boyle DOB: August 19, 1960  REASON FOR VISIT: follow up multiple sclerosis HISTORY FROM: patient  HISTORY OF PRESENT ILLNESS: Kristen Boyle is a 54 year old female with a history of multiple sclerosis. She returns today for follow-up. The patient states that she's been on Betaseron for several months now. She states that she is tolerating it well. The patient is also in physical therapy for her gait and balance. She states that this time she's not noticed much improvement. She does denies any falls. She continues to use a cane to ambulate. She has a left foot drop and wears an AFO brace. She denies any new numbness or weakness. Denies any changes with her bowels or bladder. She states that she does have ongoing constipation but thinks is due to the food that she eats. She denies any significant changes in her vision. She does state as the day goes on it is hard for her to read small print. She feels that she needs to follow-up with her eye doctor to get stronger glasses. Patient states that she is not feeling well today she woke up feeling dizzy and nauseous. However as the visit has continued she states that her dizziness is improving. The patient also states that she suffers from depression "she thinks." She was on Paxil but ran out of the medication about a month ago. She has not followed with her primary care provider. The patient states that she usually just stays at home during the day and will find herself crying for no reason. She states that when she was on the Paxil it did make her mood better. She returns today for an evaluation.  HISTORY 09/13/13 (WILLIS): Kristen Boyle is a 54 year old right-handed black female with a history of multiple sclerosis. The patient was last seen through this office on 06/10/12. At that point, the patient had come off of Gilenya secondary to stomach upset. The patient was also noted to have some elevation in liver enzymes. The patient has not been seen  since that time. She indicates that over the last 2 months, there has been some change in her ability to ambulate with weakness of the left leg, and some numbness below the knee on the left. The patient has had left leg weakness in the past, but the weakness has worsened. The patient is using a cane for ambulation. If she does not use a cane, she needs to hold onto the wall in order to walk. She has fallen 2 weeks ago. She denies any significant changes in vision, but she still has some trouble with reading small print. The patient returns to this office for an evaluation. In the past she was on Betaseron, and she was tolerating the drug well, but she went off the medication because she did not want to inject any more.   REVIEW OF SYSTEMS: Out of a complete 14 system review of symptoms, the patient complains only of the following symptoms, and all other reviewed systems are negative.  Fatigue, eye discharge, loss of vision, blurred vision, excessive sweating, facial swelling, leg swelling, constipation, frequent waking, daytime sleepiness, snoring, joint swelling, walking difficulty, itching, dizziness, headache, numbness, weakness, depression  ALLERGIES: No Known Allergies  HOME MEDICATIONS: Outpatient Prescriptions Prior to Visit  Medication Sig Dispense Refill  . ibuprofen (ADVIL,MOTRIN) 600 MG tablet Take 1 tablet (600 mg total) by mouth every 6 (six) hours as needed. 30 tablet 3  . Interferon Beta-1b (EXTAVIA) 0.3 MG KIT injection Inject 0.25 mg into the  skin every other day. 3 kit 1  . oxybutynin (DITROPAN XL) 10 MG 24 hr tablet Take 1 tablet (10 mg total) by mouth at bedtime. 30 tablet 2  . PARoxetine (PAXIL) 20 MG tablet Take 1 tablet (20 mg total) by mouth daily. 30 tablet 2  . Vitamin D, Cholecalciferol, 1000 UNITS TABS Take 1,000 Units by mouth daily. 30 tablet    No facility-administered medications prior to visit.    PAST MEDICAL HISTORY: Past Medical History  Diagnosis Date    . MS (multiple sclerosis)   . Hyperlipidemia   . Depression   . Abnormality of gait 06/10/2012    PAST SURGICAL HISTORY: Past Surgical History  Procedure Laterality Date  . Tubal ligation Bilateral     FAMILY HISTORY: Family History  Problem Relation Age of Onset  . Colon cancer Maternal Aunt   . Diabetes Paternal Aunt   . Dementia Paternal Grandmother     Senile dementia of Alzheimer's type  . Colon cancer Cousin     SOCIAL HISTORY: History   Social History  . Marital Status: Single    Spouse Name: N/A  . Number of Children: 4  . Years of Education: 2-College   Occupational History  .      Disabled   Social History Main Topics  . Smoking status: Never Smoker   . Smokeless tobacco: Never Used  . Alcohol Use: No  . Drug Use: No  . Sexual Activity: No   Other Topics Concern  . Not on file   Social History Narrative   Patient lives at home with her daughter.   Disabled.   Education two years of college.   Right handed.   Caffeine one cup of coffee not daily.      PHYSICAL EXAM Orthostatic VS for the past 24 hrs:  BP- Lying Pulse- Lying BP- Sitting Pulse- Sitting BP- Standing at 0 minutes Pulse- Standing at 0 minutes  04/08/14 1038 124/81 mmHg 61 134/85 mmHg 75 (!) 141/97 mmHg 90      Filed Vitals:   04/08/14 1031  BP: 134/85  Pulse: 75  Temp: 97.4 F (36.3 C)  Height: $Remove'5\' 1"'ZCaMNeZ$  (1.549 m)  Weight: 144 lb (65.318 kg)   Body mass index is 27.22 kg/(m^2).  Generalized: Well developed, in no acute distress, tearful today  Neurological examination  Mentation: Alert oriented to time, place, history taking. Follows all commands speech and language fluent Cranial nerve II-XII: Pupils were equal round reactive to light. Extraocular movements were full, visual field were full on confrontational test. Facial sensation and strength were normal. Uvula tongue midline. Head turning and shoulder shrug  were normal and symmetric. Motor: The motor testing reveals  5 over 5 strength of all in the right upper and lower extreme. 4-5 strength in the left lower extremity. Left foot drop. Good symmetric motor tone is noted throughout.  Sensory: Sensory testing is intact to soft touch on all 4 extremities. No evidence of extinction is noted.  Coordination: Cerebellar testing reveals good finger-nose-finger and heel-to-shin bilaterally.  Gait and station: Patient has a slight limp on the left due to foot drop. Tandem gait is unsteady. Romberg negative.  Reflexes: Deep tendon reflexes are symmetric and normal bilaterally.   DIAGNOSTIC DATA (LABS, IMAGING, TESTING) - I reviewed patient records, labs, notes, testing and imaging myself where available.      ASSESSMENT AND PLAN 54 y.o. year old female  has a past medical history of MS (multiple sclerosis); Hyperlipidemia; Depression; and  Abnormality of gait (06/10/2012). here with:  1. Multiple sclerosis 2. Abnormality of gait 3. Depression  The patient has remained stable. She is to continue taking Betaseron-unsure if she is taking Betaseron or extavia? She will check at home. I will check blood work today. The patient has not had an MRI of the brain since 2014. I will repeat that to look for progression. The patient should continue with physical therapy as well as using a cane when ambulating. The patient is very tearful during today's visit. She states that she is unsure why she is crying. She has been out of her Paxil for 1 month. I will refill this today. My assistant has gotten an appointment for the patient to see her primary care provider regarding her ongoing depression. Patient is interested in seeing a psychiatrist I put in a referral  however financially I am not sure that she will be able to pay out of pocket if insurance does not cover. If her symptoms worsen or she develops new symptoms she'll let us know. Otherwise she will follow-up in 3-4 months.   Ward Givens, MSN, NP-C 04/08/2014, 10:42  AM [facility] [address] [place], [place] [address] [phone]  Note: This document was prepared with digital dictation and possible smart phrase technology. Any transcriptional errors that result from this process are unintentional.

## 2014-04-09 LAB — COMPREHENSIVE METABOLIC PANEL
A/G RATIO: 1.3 (ref 1.1–2.5)
ALK PHOS: 114 IU/L (ref 39–117)
ALT: 33 IU/L — AB (ref 0–32)
AST: 32 IU/L (ref 0–40)
Albumin: 4.5 g/dL (ref 3.5–5.5)
BUN / CREAT RATIO: 10 (ref 9–23)
BUN: 9 mg/dL (ref 6–24)
Bilirubin Total: 0.3 mg/dL (ref 0.0–1.2)
CHLORIDE: 104 mmol/L (ref 97–108)
CO2: 23 mmol/L (ref 18–29)
Calcium: 9.9 mg/dL (ref 8.7–10.2)
Creatinine, Ser: 0.87 mg/dL (ref 0.57–1.00)
GFR calc Af Amer: 87 mL/min/{1.73_m2} (ref >59)
GFR, EST NON AFRICAN AMERICAN: 75 mL/min/{1.73_m2} (ref >59)
GLOBULIN, TOTAL: 3.5 g/dL (ref 1.5–4.5)
Glucose: 90 mg/dL (ref 65–99)
POTASSIUM: 4.2 mmol/L (ref 3.5–5.2)
Sodium: 143 mmol/L (ref 134–144)
TOTAL PROTEIN: 8 g/dL (ref 6.0–8.5)

## 2014-04-09 LAB — CBC WITH DIFFERENTIAL/PLATELET
Basophils Absolute: 0 10*3/uL (ref 0.0–0.2)
Basos: 1 %
EOS: 10 %
Eosinophils Absolute: 0.5 10*3/uL — ABNORMAL HIGH (ref 0.0–0.4)
HCT: 39.9 % (ref 34.0–46.6)
Hemoglobin: 12.5 g/dL (ref 11.1–15.9)
IMMATURE GRANS (ABS): 0 10*3/uL (ref 0.0–0.1)
Immature Granulocytes: 0 %
LYMPHS: 59 %
Lymphocytes Absolute: 2.7 10*3/uL (ref 0.7–3.1)
MCH: 23.9 pg — AB (ref 26.6–33.0)
MCHC: 31.3 g/dL — ABNORMAL LOW (ref 31.5–35.7)
MCV: 76 fL — AB (ref 79–97)
MONOCYTES: 7 %
Monocytes Absolute: 0.3 10*3/uL (ref 0.1–0.9)
Neutrophils Absolute: 1 10*3/uL — ABNORMAL LOW (ref 1.4–7.0)
Neutrophils Relative %: 23 %
Platelets: 262 10*3/uL (ref 150–379)
RBC: 5.24 x10E6/uL (ref 3.77–5.28)
RDW: 15.9 % — AB (ref 12.3–15.4)
WBC: 4.5 10*3/uL (ref 3.4–10.8)

## 2014-04-11 ENCOUNTER — Telehealth: Payer: Self-pay

## 2014-04-11 NOTE — Progress Notes (Signed)
Quick Note:  Called patient and explained Labs. Faxed Labs to her PCP. ______

## 2014-04-11 NOTE — Telephone Encounter (Signed)
Relayed labs to patient she understood. Faxed labs to Patient's PCP.

## 2014-04-13 ENCOUNTER — Ambulatory Visit: Payer: Medicare Other | Admitting: Internal Medicine

## 2014-04-20 ENCOUNTER — Encounter: Payer: Self-pay | Admitting: Internal Medicine

## 2014-04-20 ENCOUNTER — Ambulatory Visit (INDEPENDENT_AMBULATORY_CARE_PROVIDER_SITE_OTHER): Payer: Medicaid Other | Admitting: Internal Medicine

## 2014-04-20 VITALS — BP 127/76 | HR 75 | Temp 97.9°F | Ht 60.0 in | Wt 146.1 lb

## 2014-04-20 DIAGNOSIS — F329 Major depressive disorder, single episode, unspecified: Secondary | ICD-10-CM | POA: Diagnosis not present

## 2014-04-20 DIAGNOSIS — Z1231 Encounter for screening mammogram for malignant neoplasm of breast: Secondary | ICD-10-CM | POA: Diagnosis not present

## 2014-04-20 DIAGNOSIS — Z0000 Encounter for general adult medical examination without abnormal findings: Secondary | ICD-10-CM | POA: Diagnosis not present

## 2014-04-20 DIAGNOSIS — E559 Vitamin D deficiency, unspecified: Secondary | ICD-10-CM | POA: Diagnosis not present

## 2014-04-20 DIAGNOSIS — G35 Multiple sclerosis: Secondary | ICD-10-CM | POA: Diagnosis not present

## 2014-04-20 DIAGNOSIS — F32A Depression, unspecified: Secondary | ICD-10-CM

## 2014-04-20 MED ORDER — VITAMIN D (CHOLECALCIFEROL) 25 MCG (1000 UT) PO TABS: 2000.0000 [IU] | ORAL_TABLET | Freq: Every day | ORAL | Status: DC

## 2014-04-20 NOTE — Progress Notes (Signed)
Patient ID: Kristen Boyle, female   DOB: 1960-09-26, 54 y.o.   MRN: [id number]     Subjective:   Patient ID: Kristen Boyle female   DOB: [birth year] 54 y.o.   MRN: [id number]  HPI: Kristen Boyle is a 54 y.o. pleasant woman with past medical history of relapsing remitting MS on disease modifying therapy, hyperlipidemia, vitamin D insufficiency, and depression who presents for follow-up for depression.   At last visit in October she was started on paxil which she reports helped with her mood and hot flashes. She ran out of her medications last month. She continues to have loss of interest in activities, insomnia, and decreased appetite with no weight change.   She was recently seen by her neurologist on 04/08/14 and is complint with betaseron injections. She is no longer attending physical rehab due to end of insurance coverage.She denies recent fall or flare. She continues to use a cane to ambulate and has chronic blurry vision, spasticity of left LE (declines starting baclofen), constipation, memory loss, and fatigue. Her bladder incontinence has improved after starting oxybutynin. She is compliant with taking vitamin D supplements. She is to have MRI brain soon since her last one was June of 2014.   She is agreeable in having a mammogram. She reports having a colonoscopy in the past 10 years.      Past Medical History  Diagnosis Date  . MS (multiple sclerosis)   . Hyperlipidemia   . Depression   . Abnormality of gait 06/10/2012   Current Outpatient Prescriptions  Medication Sig Dispense Refill  . ibuprofen (ADVIL,MOTRIN) 600 MG tablet Take 1 tablet (600 mg total) by mouth every 6 (six) hours as needed. 30 tablet 3  . Interferon Beta-1b (EXTAVIA) 0.3 MG KIT injection Inject 0.25 mg into the skin every other day. 3 kit 1  . oxybutynin (DITROPAN XL) 10 MG 24 hr tablet Take 1 tablet (10 mg total) by mouth at bedtime. 30 tablet 2  . PARoxetine (PAXIL) 20 MG tablet Take 1  tablet (20 mg total) by mouth daily. 30 tablet 0  . Vitamin D, Cholecalciferol, 1000 UNITS TABS Take 1,000 Units by mouth daily. 30 tablet    No current facility-administered medications for this visit.   Family History  Problem Relation Age of Onset  . Colon cancer Maternal Aunt   . Diabetes Paternal Aunt   . Dementia Paternal Grandmother     Senile dementia of Alzheimer's type  . Colon cancer Cousin    History   Social History  . Marital Status: Single    Spouse Name: N/A  . Number of Children: 4  . Years of Education: 2-College   Occupational History  .      Disabled   Social History Main Topics  . Smoking status: Never Smoker   . Smokeless tobacco: Never Used  . Alcohol Use: No  . Drug Use: No  . Sexual Activity: No   Other Topics Concern  . Not on file   Social History Narrative   Patient lives at home with her daughter.   Disabled.   Education two years of college.   Right handed.   Caffeine one cup of coffee not daily.   Review of Systems: Review of Systems  Constitutional: Positive for malaise/fatigue (chronic). Negative for fever, chills and weight loss.       Improved hot flashes  Eyes: Positive for blurred vision (chronic).  Respiratory: Negative for cough and shortness of breath.  Cardiovascular: Positive for leg swelling (chronic left LE). Negative for chest pain.  Gastrointestinal: Positive for constipation. Negative for nausea, vomiting, abdominal pain and diarrhea.  Genitourinary: Negative for dysuria, urgency, frequency and hematuria.       Chronic urinary incontinence   Musculoskeletal: Positive for myalgias.  Neurological: Positive for dizziness, sensory change (chronic in left LE) and focal weakness (chronic in left LE).  Psychiatric/Behavioral: Positive for depression (improved) and memory loss. The patient has insomnia.     Objective:  Physical Exam: Filed Vitals:   04/20/14 1419  BP: 127/76  Pulse: 75  Temp: 97.9 F (36.6 C)    TempSrc: Oral  Height: 5' (1.524 m)  Weight: 146 lb 1.6 oz (66.271 kg)  SpO2: 100%    Physical Exam  Constitutional: She is oriented to person, place, and time. She appears well-developed and well-nourished. No distress.  HENT:  Head: Normocephalic and atraumatic.  Right Ear: External ear normal.  Left Ear: External ear normal.  Nose: Nose normal.  Mouth/Throat: Oropharynx is clear and moist. No oropharyngeal exudate.  Eyes: Conjunctivae and EOM are normal. Pupils are equal, round, and reactive to light. Right eye exhibits no discharge. Left eye exhibits no discharge. No scleral icterus.  Neck: Normal range of motion. Neck supple.  Cardiovascular: Normal rate, regular rhythm and normal heart sounds.   Pulmonary/Chest: Effort normal and breath sounds normal. No respiratory distress. She has no wheezes. She has no rales.  Abdominal: Soft. Bowel sounds are normal. She exhibits no distension. There is no tenderness. There is no rebound and no guarding.  Musculoskeletal: Normal range of motion. She exhibits edema (Trace left LE). She exhibits no tenderness.  Neurological: She is alert and oriented to person, place, and time. She displays abnormal reflex (hyperreflexic ). No cranial nerve deficit.  Uses cane to ambulate. Left 2/5 strength with decreased sensation to light touch of left side of face, UE, and LE.  Skin: Skin is warm and dry. No rash noted. She is not diaphoretic. No erythema. No pallor.  Psychiatric: She has a normal mood and affect. Her behavior is normal. Judgment and thought content normal.    Assessment & Plan:   Please see problem list for problem-based assessment and plan

## 2014-04-20 NOTE — Patient Instructions (Addendum)
-  Start taking paxil 20 mg daily again  -Increase your vitamin D from 1000 U to 2000 U daily  -Will schedule you for mammogram -Please bring records of your last colonoscopy -Glad you are doing better, please come back in 3 months  General Instructions:   Please bring your medicines with you each time you come to clinic.  Medicines may include prescription medications, over-the-counter medications, herbal remedies, eye drops, vitamins, or other pills.   Progress Toward Treatment Goals:  No flowsheet data found.  Self Care Goals & Plans:  Self Care Goal 10/08/2013  Manage my medications take my medicines as prescribed; refill my medications on time  Eat healthy foods eat foods that are low in salt; eat baked foods instead of fried foods  Prevent falls use home fall prevention checklist to improve safety; have my vision checked; wear appropriate shoes    No flowsheet data found.   Care Management & Community Referrals:  No flowsheet data found.

## 2014-04-21 ENCOUNTER — Ambulatory Visit
Admission: RE | Admit: 2014-04-21 | Discharge: 2014-04-21 | Disposition: A | Discharge status: Home / Self Care | Payer: Medicare Other | Source: Ambulatory Visit | Attending: Adult Health | Admitting: Adult Health

## 2014-04-21 DIAGNOSIS — G35 Multiple sclerosis: Secondary | ICD-10-CM

## 2014-04-21 MED ORDER — GADOBENATE DIMEGLUMINE 529 MG/ML IV SOLN
13.0000 mL | Freq: Once | INTRAVENOUS | Status: AC | PRN
  Administered 2014-04-21: 13 mL via INTRAVENOUS

## 2014-04-21 NOTE — Assessment & Plan Note (Addendum)
Assessment: Pt with vitamin D level of 25 on 11/19/13 compliant with vitamin D supplementation who presents with no recent fall or fracture.   Plan:  -Increase cholecalciferol 1000 U daily to 2000 U daily

## 2014-04-21 NOTE — Assessment & Plan Note (Addendum)
Assessment: Pt with relapsing-remitting MS with last MRI 06/2012 compliant with disease-modifying therapy (interferon beta preparation) who presents with no recent exacerbation or fall.   Plan:  -Continue betaseron injections per neurology  -Continue oxybutynin extended release 10 mg daily at bedtime for urge incontinence  -Pt declines starting baclofen for left LE spasticity -Pt instructed to take OTC colace or miralax for chronic constipation  -Increase cholecalciferol 1000 U daily to 2000 U daily in setting of vitamin D insufficiency as vitamin D supplementation is an adjunctive treatment in MS and shown to be superior to disease-modifying therapy alone  -Pt to have MRI brain on 04/20/14 and to follow-up with neurologist Dr. Luz Lex 3-4 months

## 2014-04-21 NOTE — Assessment & Plan Note (Signed)
-  Pt scheduled for screening mammography  -Pt reports having screening colonoscopy in past 10 years, will bring records at next visit -Pt declines annual influenza vaccination -Obtain HIV screening Ab at next visit

## 2014-04-21 NOTE — Assessment & Plan Note (Signed)
Assessment: Pt with history of MS and depression partially compliant with SSRI therapy who presents with improved mood.  Plan:  -Continue paroxetine 20 mg daily in setting of post-menopausal symptoms which she reports improvement of  -Pt to return in 3 months for further adjustment

## 2014-04-21 NOTE — Addendum Note (Signed)
Addended byOtis Brace on: 04/21/2014 10:43 PM   Modules accepted: Orders

## 2014-04-24 NOTE — Progress Notes (Signed)
Case discussed with Dr. Rabbani soon after the resident saw the patient.  We reviewed the resident's history and exam and pertinent patient test results.  I agree with the assessment, diagnosis, and plan of care documented in the resident's note. 

## 2014-08-10 ENCOUNTER — Ambulatory Visit (INDEPENDENT_AMBULATORY_CARE_PROVIDER_SITE_OTHER): Payer: Medicare Other | Admitting: Adult Health

## 2014-08-10 ENCOUNTER — Encounter: Payer: Self-pay | Admitting: Adult Health

## 2014-08-10 VITALS — BP 121/82 | HR 82 | Ht 60.0 in | Wt 142.0 lb

## 2014-08-10 DIAGNOSIS — G35 Multiple sclerosis: Secondary | ICD-10-CM

## 2014-08-10 DIAGNOSIS — R269 Unspecified abnormalities of gait and mobility: Secondary | ICD-10-CM | POA: Diagnosis not present

## 2014-08-10 DIAGNOSIS — F32A Depression, unspecified: Secondary | ICD-10-CM

## 2014-08-10 DIAGNOSIS — F329 Major depressive disorder, single episode, unspecified: Secondary | ICD-10-CM | POA: Diagnosis not present

## 2014-08-10 NOTE — Progress Notes (Signed)
I have read the note, and I agree with the clinical assessment and plan.  Antwoin Lackey KEITH   

## 2014-08-10 NOTE — Progress Notes (Signed)
PATIENT: Kristen Boyle DOB: 1960/02/16  REASON FOR VISIT: follow up-multiple sclerosis HISTORY FROM: patient  HISTORY OF PRESENT ILLNESS: Kristen Boyle is a 54 year old female with a history of multiple sclerosis. She returns today for follow-up. The patient continues to take Betaseron and tolerates it well. She denies any new numbness or weakness. Continues to have ongoing weakness of the left leg. Denies any changes with her bowels or bladder. Denies any changes in her gait or balance. She has been in physical therapy in the past but did not notice much improvement. She has a left foot drop and wears an AFO brace. She uses a cane when ambulating. Denies any significant fatigue. At the last visit the patient was not taking her Paxil was experiencing signs and symptoms of depression. Her Paxil was restarted but the patient never followed up with her primary care provider. The patient was scheduled for an appointment with her primary care provider by my assistant at her last office visit. The patient does not recall going to this visit. She states that she has not taken Paxil in several months. She states that her mood has been up and down. She is not interested in being on medication daily. She denies any new neurological symptoms. Returns today for an evaluation.  HISTORY 04/08/2014: Kristen Boyle is a 54 year old female with a history of multiple sclerosis. She returns today for follow-up. The patient states that she's been on Betaseron for several months now. She states that she is tolerating it well. The patient is also in physical therapy for her gait and balance. She states that this time she's not noticed much improvement. She does denies any falls. She continues to use a cane to ambulate. She has a left foot drop and wears an AFO brace. She denies any new numbness or weakness. Denies any changes with her bowels or bladder. She states that she does have ongoing constipation but thinks is due to  the food that she eats. She denies any significant changes in her vision. She does state as the day goes on it is hard for her to read small print. She feels that she needs to follow-up with her eye doctor to get stronger glasses. Patient states that she is not feeling well today she woke up feeling dizzy and nauseous. However as the visit has continued she states that her dizziness is improving. The patient also states that she suffers from depression "she thinks." She was on Paxil but ran out of the medication about a month ago. She has not followed with her primary care provider. The patient states that she usually just stays at home during the day and will find herself crying for no reason. She states that when she was on the Paxil it did make her mood better. She returns today for an evaluation.  HISTORY 09/13/13 (WILLIS): Kristen Boyle is a 54 year old right-handed black female with a history of multiple sclerosis. The patient was last seen through this office on 06/10/12. At that point, the patient had come off of Gilenya secondary to stomach upset. The patient was also noted to have some elevation in liver enzymes. The patient has not been seen since that time. She indicates that over the last 2 months, there has been some change in her ability to ambulate with weakness of the left leg, and some numbness below the knee on the left. The patient has had left leg weakness in the past, but the weakness has worsened. The patient is  using a cane for ambulation. If she does not use a cane, she needs to hold onto the wall in order to walk. She has fallen 2 weeks ago. She denies any significant changes in vision, but she still has some trouble with reading small print. The patient returns to this office for an evaluation. In the past she was on Betaseron, and she was tolerating the drug well, but she went off the medication because she did not want to inject any more.  REVIEW OF SYSTEMS: Out of a complete 14 system  review of symptoms, the patient complains only of the following symptoms, and all other reviewed systems are negative.  See history of present illness  ALLERGIES: No Known Allergies / HOME MEDICATIONS: Outpatient Prescriptions Prior to Visit  Medication Sig Dispense Refill  . ibuprofen (ADVIL,MOTRIN) 600 MG tablet Take 1 tablet (600 mg total) by mouth every 6 (six) hours as needed. 30 tablet 3  . Interferon Beta-1b (EXTAVIA) 0.3 MG KIT injection Inject 0.25 mg into the skin every other day. 3 kit 1  . oxybutynin (DITROPAN XL) 10 MG 24 hr tablet Take 1 tablet (10 mg total) by mouth at bedtime. 30 tablet 2  . PARoxetine (PAXIL) 20 MG tablet Take 1 tablet (20 mg total) by mouth daily. 30 tablet 0  . Vitamin D, Cholecalciferol, 1000 UNITS TABS Take 2,000 Units by mouth daily at 12 noon. 30 tablet    No facility-administered medications prior to visit.    PAST MEDICAL HISTORY: Past Medical History  Diagnosis Date  . MS (multiple sclerosis)   . Hyperlipidemia   . Depression   . Abnormality of gait 06/10/2012    PAST SURGICAL HISTORY: Past Surgical History  Procedure Laterality Date  . Tubal ligation Bilateral     FAMILY HISTORY: Family History  Problem Relation Age of Onset  . Colon cancer Maternal Aunt   . Diabetes Paternal Aunt   . Dementia Paternal Grandmother     Senile dementia of Alzheimer's type  . Colon cancer Cousin     SOCIAL HISTORY: History   Social History  . Marital Status: Single    Spouse Name: N/A  . Number of Children: 4  . Years of Education: 2-College   Occupational History  .      Disabled   Social History Main Topics  . Smoking status: Never Smoker   . Smokeless tobacco: Never Used  . Alcohol Use: No  . Drug Use: No  . Sexual Activity: No   Other Topics Concern  . Not on file   Social History Narrative   Patient lives at home with her daughter.   Disabled.   Education two years of college.   Right handed.   Caffeine one cup of  coffee not daily.      PHYSICAL EXAM  Filed Vitals:   08/10/14 1339  BP: 121/82  Pulse: 82  Height: 5' (1.524 m)  Weight: 142 lb (64.411 kg)   Body mass index is 27.73 kg/(m^2).  Generalized: Well developed, in no acute distress   Neurological examination  Mentation: Alert oriented to time, place, history taking. Follows all commands speech and language fluent Cranial nerve II-XII: Pupils were equal round reactive to light. Extraocular movements were full, visual field were full on confrontational test. Facial sensation and strength were normal. Uvula tongue midline. Head turning and shoulder shrug  were normal and symmetric. Motor: The motor testing reveals 5 over 5 strength in the right and left upper extremity and  right lower extremity. 3-4/5 strength in the left lower extremity. Foot drop on the left. Good symmetric motor tone is noted throughout.  Sensory: Sensory testing is intact to soft touch on all 4 extremities. No evidence of extinction is noted.  Coordination: Cerebellar testing reveals good finger-nose-finger and heel-to-shin bilaterally.  Gait and station: Patient uses a cane to ambulate. She has mild circumduction gait on the left. Tandem gait is normal. Romberg is negative. No drift is seen.  Reflexes: Deep tendon reflexes are symmetric and normal bilaterally.   DIAGNOSTIC DATA (LABS, IMAGING, TESTING) - I reviewed patient records, labs, notes, testing and imaging myself where available.  Lab Results  Component Value Date   WBC 4.5 04/08/2014   HGB 12.5 04/08/2014   HCT 39.9 04/08/2014   MCV 76* 04/08/2014   PLT 262 04/08/2014      Component Value Date/Time   NA 143 04/08/2014 1122   NA 140 09/09/2013 1115   K 4.2 04/08/2014 1122   CL 104 04/08/2014 1122   CO2 23 04/08/2014 1122   GLUCOSE 90 04/08/2014 1122   GLUCOSE 88 09/09/2013 1115   BUN 9 04/08/2014 1122   BUN 19 09/09/2013 1115   CREATININE 0.87 04/08/2014 1122   CREATININE 0.89 09/09/2013 1115     CALCIUM 9.9 04/08/2014 1122   PROT 8.0 04/08/2014 1122   PROT 8.0 09/09/2013 1115   ALBUMIN 4.5 09/09/2013 1115   AST 32 04/08/2014 1122   ALT 33* 04/08/2014 1122   ALKPHOS 114 04/08/2014 1122   BILITOT 0.3 04/08/2014 1122   BILITOT 0.3 09/09/2013 1115   GFRNONAA 75 04/08/2014 1122   GFRNONAA 73 09/09/2013 1115   GFRAA 87 04/08/2014 1122   GFRAA 84 09/09/2013 1115   Lab Results  Component Value Date   CHOL 256* 09/09/2013   HDL 68 09/09/2013   LDLCALC 165* 09/09/2013   TRIG 113 09/09/2013   CHOLHDL 3.8 09/09/2013       ASSESSMENT AND PLAN 54 y.o. year old female  has a past medical history of MS (multiple sclerosis); Hyperlipidemia; Depression; and Abnormality of gait (06/10/2012). here with:  1. Multiple sclerosis 2. Abnormality of gait   3. Depression  Overall the patient has remained stable. She will continue on Betaseron. She had blood work at the last visit. MRI at the last visit showed a new possible lesion in the right cerebellar hemisphere. We will consider repeating an MRI at the next visit. I have advised patient that she should follow up with her primary care provider in regards to her depression. She verbalized understanding. Patient advised that if her symptoms worsen or she develops new symptoms she should let us know. She will follow-up in 6 months or sooner if needed.    Ward Givens, MSN, NP-C 08/10/2014, 1:37 PM [facility] [address] [place], [place] [address] [phone]  Note: This document was prepared with digital dictation and possible smart phrase technology. Any transcriptional errors that result from this process are unintentional.

## 2014-08-10 NOTE — Patient Instructions (Addendum)
Continue betaseron If your symptoms worsen or you develop new symptoms please let us know.  Will consider repeating MRI at the next visit.  Follow-up with PCP regarding depression medication & vit D levels

## 2014-08-24 ENCOUNTER — Other Ambulatory Visit: Payer: Self-pay | Admitting: Neurology

## 2014-10-14 ENCOUNTER — Encounter: Payer: Self-pay | Admitting: Internal Medicine

## 2014-10-14 ENCOUNTER — Ambulatory Visit (INDEPENDENT_AMBULATORY_CARE_PROVIDER_SITE_OTHER): Payer: Medicare Other | Admitting: Internal Medicine

## 2014-10-14 VITALS — BP 150/135 | Temp 97.8°F | Wt 146.4 lb

## 2014-10-14 DIAGNOSIS — G35 Multiple sclerosis: Secondary | ICD-10-CM

## 2014-10-14 DIAGNOSIS — F329 Major depressive disorder, single episode, unspecified: Secondary | ICD-10-CM | POA: Diagnosis not present

## 2014-10-14 DIAGNOSIS — Z0000 Encounter for general adult medical examination without abnormal findings: Secondary | ICD-10-CM

## 2014-10-14 DIAGNOSIS — F32A Depression, unspecified: Secondary | ICD-10-CM

## 2014-10-14 MED ORDER — PAROXETINE HCL 20 MG PO TABS: 20.0000 mg | ORAL_TABLET | Freq: Every day | ORAL | Status: DC

## 2014-10-14 NOTE — Patient Instructions (Addendum)
-   I refilled your paxil, they should deliver it to your house -Will schedule you for a mammogram -Glad you are doing well, please come back for a pap smear   General Instructions:   Please bring your medicines with you each time you come to clinic.  Medicines may include prescription medications, over-the-counter medications, herbal remedies, eye drops, vitamins, or other pills.   Progress Toward Treatment Goals:  No flowsheet data found.  Self Care Goals & Plans:  Self Care Goal 10/08/2013  Manage my medications take my medicines as prescribed; refill my medications on time  Eat healthy foods eat foods that are low in salt; eat baked foods instead of fried foods  Prevent falls use home fall prevention checklist to improve safety; have my vision checked; wear appropriate shoes    No flowsheet data found.   Care Management & Community Referrals:  No flowsheet data found.

## 2014-10-14 NOTE — Progress Notes (Signed)
Patient ID: Kristen Boyle, female   DOB: 06-25-1960, 54 y.o.   MRN: [id number]    Subjective:   Patient ID: Kristen Boyle female   DOB: [birth year] 54 y.o.   MRN: [id number]  HPI: Kristen Boyle is a 54 y.o. pleasant woman with past medical history of relapsing remitting MS on disease modifying therapy, hyperlipidemia, vitamin D insufficiency, and depression who presents for follow-up of depression.   She has history of depression and was started on paxil last October in setting of hot flashes which she reported helped both. She reports running out of her medication since June due not having a way to get to the pharmacy. She continues to have depressed mood, loss of interest in activities, insomnia, and decreased appetite with no weight change. She would like to restart therapy with paxil.   She was recently seen by her neurologist on 08/10/14. MRI brain on 03/24/14 revealed possible new lesion in the right cerebellar hemisphere and may have repeating imaging at next neurology follow-up visit in January 2017. She is compliant with betaseron injections. She is no longer attending physical rehab due to end of insurance coverage.She denies recent fall or flare. She continues to use a cane in public and walker at home to ambulate. She has chronic blurry vision, spasticity of left LE (declines starting baclofen), constipation, memory loss, and fatigue. She is no longer taking oxybutynin for urinary incontinence  because she is able to get to the restroom in time at home. She is compliant with wearing left LE brace. She is compliant with taking vitamin D supplements.   She never had mammogram performed that was scheduled at last visit. She reports having a colonoscopy in the past 10 years. She has not had recent pap smear testing. She declines flu shot.       Past Medical History  Diagnosis Date  . MS (multiple sclerosis)   . Hyperlipidemia   . Depression   . Abnormality of gait  06/10/2012   Current Outpatient Prescriptions  Medication Sig Dispense Refill  . BETASERON 0.3 MG KIT injection Inject 0.25mg  (36ml) sub-q every other day 14 kit 11  . ibuprofen (ADVIL,MOTRIN) 600 MG tablet Take 1 tablet (600 mg total) by mouth every 6 (six) hours as needed. 30 tablet 3  . PARoxetine (PAXIL) 20 MG tablet Take 1 tablet (20 mg total) by mouth daily. 30 tablet 0  . Vitamin D, Cholecalciferol, 1000 UNITS TABS Take 2,000 Units by mouth daily at 12 noon. 30 tablet   . oxybutynin (DITROPAN XL) 10 MG 24 hr tablet Take 1 tablet (10 mg total) by mouth at bedtime. (Patient not taking: Reported on 10/14/2014) 30 tablet 2   No current facility-administered medications for this visit.   Family History  Problem Relation Age of Onset  . Colon cancer Maternal Aunt   . Diabetes Paternal Aunt   . Dementia Paternal Grandmother     Senile dementia of Alzheimer's type  . Colon cancer Cousin    Social History   Social History  . Marital Status: Single    Spouse Name: N/A  . Number of Children: 4  . Years of Education: 2-College   Occupational History  .      Disabled   Social History Main Topics  . Smoking status: Never Smoker   . Smokeless tobacco: Never Used  . Alcohol Use: No  . Drug Use: No  . Sexual Activity: No   Other Topics Concern  . None  Social History Narrative   Patient lives at home with her daughter.   Disabled.   Education two years of college.   Right handed.   Caffeine one cup of coffee not daily.   Review of Systems: Review of Systems  Constitutional: Positive for malaise/fatigue. Negative for weight loss.  Eyes: Positive for blurred vision (chronic).  Respiratory: Negative for cough, shortness of breath and wheezing.   Cardiovascular: Positive for leg swelling (chronic L>R). Negative for chest pain.  Gastrointestinal: Positive for nausea, abdominal pain (epigastric recently that resolved) and constipation (chronic). Negative for diarrhea, blood in  stool and melena.  Genitourinary: Positive for urgency (chronic incontinence ). Negative for dysuria, frequency and hematuria.  Musculoskeletal: Positive for back pain (chronic). Negative for falls.  Neurological: Positive for dizziness (chronic lightheadedness ), sensory change (chronic in left LE), focal weakness (chronic in left LE) and headaches (chronic).  Psychiatric/Behavioral: Positive for depression and memory loss (short and long term). The patient has insomnia.     Objective:  Physical Exam: Filed Vitals:   10/14/14 1505  BP: 150/135  Temp: 97.8 F (36.6 C)  TempSrc: Oral  Weight: 146 lb 6.4 oz (66.407 kg)  SpO2: 100%    Physical Exam  Constitutional: She is oriented to person, place, and time. She appears well-developed and well-nourished. No distress.  HENT:  Head: Normocephalic and atraumatic.  Right Ear: External ear normal.  Left Ear: External ear normal.  Nose: Nose normal.  Mouth/Throat: Oropharynx is clear and moist. No oropharyngeal exudate.  Eyes: Conjunctivae and EOM are normal. Pupils are equal, round, and reactive to light. Right eye exhibits no discharge. Left eye exhibits no discharge. No scleral icterus.  No INO  Neck: Normal range of motion. Neck supple.  Cardiovascular: Normal rate, regular rhythm and normal heart sounds.   No murmur heard. Pulmonary/Chest: Effort normal and breath sounds normal. No respiratory distress. She has no wheezes. She has no rales.  Abdominal: Soft. Bowel sounds are normal. She exhibits no distension. There is no tenderness. There is no rebound and no guarding.  Musculoskeletal: Normal range of motion. She exhibits edema (+1 Left LE> right LE) and tenderness (left LE).  Brace on left LE  Neurological: She is alert and oriented to person, place, and time. She displays abnormal reflex (hyperreflexic ). No cranial nerve deficit. She exhibits abnormal muscle tone (spasisity of left LE). Coordination (uses cane to ambulate)  abnormal.  Left 2/5 strength with decreased sensation to light touch of left side of face, UE, and LE. Right sided strength and sensation normal.   Skin: Skin is warm and dry. No rash noted. She is not diaphoretic. No erythema. No pallor.  Psychiatric: Her behavior is normal. Judgment and thought content normal.  Depressed mood    Assessment & Plan:   Please see problem list for problem-based assessment and plan

## 2014-10-16 DIAGNOSIS — R718 Other abnormality of red blood cells: Secondary | ICD-10-CM | POA: Insufficient documentation

## 2014-10-16 NOTE — Assessment & Plan Note (Addendum)
-  Pt to be scheduled for screening mammography  -Pt reports having screening colonoscopy in past 10 years, will need to obtain records  -Pt declines annual influenza vaccination -Pt instructed to return for pap smear -Obtain HIV screening Ab at next visit

## 2014-10-16 NOTE — Assessment & Plan Note (Signed)
Assessment: Pt with relapsing-remitting MS with last MRI 04/20/14 with possible new lesion in right cerebellar hemisphere compliant with disease-modifying therapy (interferon beta preparation) who presents with no recent exacerbation or fall.   Plan:  -Continue betaseron injections per neurology  -Discontinue oxybutynin ER 10 mg daily at bedtime for urge incontinence as pt no longer using it  -Pt declines starting baclofen for left LE spasticity -Pt instructed to take OTC senokot-S for chronic constipation  -Continue cholecalciferol 2000 U daily in setting of vitamin D insufficiency as vitamin D supplementation is an adjunctive treatment in MS and shown to be superior to disease-modifying therapy alone  -Pt to follow-up with neurologist Dr. Luz Lex January 2017 for possible repeat MRI brain

## 2014-10-16 NOTE — Assessment & Plan Note (Signed)
Assessment: Pt with history of MS and depression non-compliant with SSRI therapy who presents with depressed mood without suicide ideation.  Plan:  -Refill paroxetine 20 mg daily in setting of post-menopausal symptoms, pt to have mail-order delivery to her house as she is does not have transportation to the pharmacy   -Pt to return in 3 months for further adjustment

## 2014-10-17 NOTE — Progress Notes (Signed)
Internal Medicine Clinic Attending  Case discussed with Dr. Rabbani soon after the resident saw the patient.  We reviewed the resident's history and exam and pertinent patient test results.  I agree with the assessment, diagnosis, and plan of care documented in the resident's note.  

## 2014-12-13 ENCOUNTER — Encounter: Payer: Self-pay | Admitting: Student

## 2015-01-05 ENCOUNTER — Telehealth: Payer: Self-pay | Admitting: Neurology

## 2015-01-05 NOTE — Telephone Encounter (Signed)
Verlon Au [phone] with [facility] called with FYI that REBIF is the preferred formulary MS injectable medication for [facility]. She is not asking the provider to change the pt's medication especially if they are doing well but if something changes in the future the provider will know that REBIF is the preferred drug. Still would need prior auth but out of pocket for pt would be less

## 2015-02-15 ENCOUNTER — Ambulatory Visit (INDEPENDENT_AMBULATORY_CARE_PROVIDER_SITE_OTHER): Payer: Medicare Other | Admitting: Adult Health

## 2015-02-15 ENCOUNTER — Encounter: Payer: Self-pay | Admitting: Adult Health

## 2015-02-15 VITALS — BP 135/85 | HR 60 | Ht 60.0 in | Wt 146.5 lb

## 2015-02-15 DIAGNOSIS — Z5181 Encounter for therapeutic drug level monitoring: Secondary | ICD-10-CM | POA: Diagnosis not present

## 2015-02-15 DIAGNOSIS — G35 Multiple sclerosis: Secondary | ICD-10-CM

## 2015-02-15 DIAGNOSIS — R269 Unspecified abnormalities of gait and mobility: Secondary | ICD-10-CM | POA: Diagnosis not present

## 2015-02-15 NOTE — Patient Instructions (Signed)
Continue betaseron Blood work today MRI brain ordered If your symptoms worsen or you develop new symptoms please let us know.

## 2015-02-15 NOTE — Progress Notes (Signed)
I have read the note, and I agree with the clinical assessment and plan.  Lilyrose Tanney KEITH   

## 2015-02-15 NOTE — Progress Notes (Signed)
PATIENT: Kristen Boyle DOB: Jun 10, 1960  REASON FOR VISIT: follow up- multiple sclerosis HISTORY FROM: patient  HISTORY OF PRESENT ILLNESS:  Kristen Boyle is a 55 year old female with a history of multiple sclerosis. She returns today for follow-up. The patient is currently on Betaseron and tolerating it well. The patient had a repeat MRI in March 2016. It did show a new lesion since the previous scan. The patient reports that she does have numbness in the hands and feet. She states this has been ongoing. She states that she has noticed some swelling in the hands and the feet in the last month. She states that the numbness may also be a little worse. The patient states that she has began to walk with a walker. She states that she feels more stable when using this. However today she is using a cane. The patient states that she went on a cruise for Christmas and fell in the shower. She is unsure what caused the fall. The patient states that she does have some urinary urgency. She continues to have constipation however she is not eating a lot of food high fiber but rather starchy foods. The patient states that if she is watching TV she will notice some diplopia. She returns today for an evaluation.  HISTORY 08/10/14: Kristen Boyle is a 55 year old female with a history of multiple sclerosis. She returns today for follow-up. The patient continues to take Betaseron and tolerates it well. She denies any new numbness or weakness. Continues to have ongoing weakness of the left leg. Denies any changes with her bowels or bladder. Denies any changes in her gait or balance. She has been in physical therapy in the past but did not notice much improvement. She has a left foot drop and wears an AFO brace. She uses a cane when ambulating. Denies any significant fatigue. At the last visit the patient was not taking her Paxil was experiencing signs and symptoms of depression. Her Paxil was restarted but the patient never  followed up with her primary care provider. The patient was scheduled for an appointment with her primary care provider by my assistant at her last office visit. The patient does not recall going to this visit. She states that she has not taken Paxil in several months. She states that her mood has been up and down. She is not interested in being on medication daily. She denies any new neurological symptoms. Returns today for an evaluation.  REVIEW OF SYSTEMS: Out of a complete 14 system review of symptoms, the patient complains only of the following symptoms, and all other reviewed systems are negative.  ALLERGIES: No Known Allergies  HOME MEDICATIONS: Outpatient Prescriptions Prior to Visit  Medication Sig Dispense Refill  . BETASERON 0.3 MG KIT injection Inject 0.'25mg'$  (37m) sub-q every other day 14 kit 11  . ibuprofen (ADVIL,MOTRIN) 600 MG tablet Take 1 tablet (600 mg total) by mouth every 6 (six) hours as needed. 30 tablet 3  . PARoxetine (PAXIL) 20 MG tablet Take 1 tablet (20 mg total) by mouth daily. 30 tablet 5  . Vitamin D, Cholecalciferol, 1000 UNITS TABS Take 2,000 Units by mouth daily at 12 noon. 30 tablet    No facility-administered medications prior to visit.    PAST MEDICAL HISTORY: Past Medical History  Diagnosis Date  . [place]   . Hyperlipidemia   . Depression   . Abnormality of gait 06/10/2012    PAST SURGICAL HISTORY: Past Surgical History  Procedure Laterality Date  . Tubal ligation Bilateral     FAMILY HISTORY: Family History  Problem Relation Age of Onset  . Colon cancer Maternal Aunt   . Diabetes Paternal Aunt   . Dementia Paternal Grandmother     Senile dementia of Alzheimer's type  . Colon cancer Cousin     SOCIAL HISTORY: Social History   Social History  . Marital Status: Single    Spouse Name: N/A  . Number of Children: 4  . Years of Education: 2-College   Occupational History  .      Disabled   Social History Main  Topics  . Smoking status: Never Smoker   . Smokeless tobacco: Never Used  . Alcohol Use: No  . Drug Use: No  . Sexual Activity: No   Other Topics Concern  . Not on file   Social History Narrative   Patient lives at home with her daughter.   Disabled.   Education two years of college.   Right handed.   Caffeine one cup of coffee not daily.      PHYSICAL EXAM  Filed Vitals:   02/15/15 1400  BP: 135/85  Pulse: 60  Height: 5' (1.524 m)  Weight: 146 lb 8 oz (66.452 kg)   Body mass index is 28.61 kg/(m^2).  Generalized: Well developed, in no acute distress   Neurological examination  Mentation: Alert oriented to time, place, history taking. Follows all commands speech and language fluent Cranial nerve II-XII: Pupils were equal round reactive to light. Extraocular movements were full, visual field were full on confrontational test. Facial sensation and strength were normal. Uvula tongue midline. Head turning and shoulder shrug  were normal and symmetric. Motor: The motor testing reveals 5 over 5 strength of all 4 extremities. However movements are very slow. Good symmetric motor tone is noted throughout.  Sensory: Sensory testing is intact to soft touch on all 4 extremities. No evidence of extinction is noted.  Coordination: Cerebellar testing reveals good finger-nose-finger and heel-to-shin bilaterally.  Gait and station: Patient uses a cane when ambulating. She has steppage gait on the left due to left foot drop. Tandem gait not attempted. Romberg is negative. Reflexes: Deep tendon reflexes are symmetric and normal bilaterally.   DIAGNOSTIC DATA (LABS, IMAGING, TESTING) - I reviewed patient records, labs, notes, testing and imaging myself where available.  Lab Results  Component Value Date   WBC 4.5 04/08/2014   HGB 12.5 04/08/2014   HCT 39.9 04/08/2014   MCV 76* 04/08/2014   PLT 262 04/08/2014      Component Value Date/Time   NA 143 04/08/2014 1122   NA 140  09/09/2013 1115   K 4.2 04/08/2014 1122   CL 104 04/08/2014 1122   CO2 23 04/08/2014 1122   GLUCOSE 90 04/08/2014 1122   GLUCOSE 88 09/09/2013 1115   BUN 9 04/08/2014 1122   BUN 19 09/09/2013 1115   CREATININE 0.87 04/08/2014 1122   CREATININE 0.89 09/09/2013 1115   CALCIUM 9.9 04/08/2014 1122   PROT 8.0 04/08/2014 1122   PROT 8.0 09/09/2013 1115   ALBUMIN 4.5 04/08/2014 1122   ALBUMIN 4.5 09/09/2013 1115   AST 32 04/08/2014 1122   ALT 33* 04/08/2014 1122   ALKPHOS 114 04/08/2014 1122   BILITOT 0.3 04/08/2014 1122   BILITOT 0.3 09/09/2013 1115   GFRNONAA 75 04/08/2014 1122   GFRNONAA 73 09/09/2013 1115   GFRAA 87 04/08/2014 1122   GFRAA 84 09/09/2013 1115     ASSESSMENT  AND PLAN 55 y.o. year old female  has a past medical history of MS (multiple sclerosis) ([place]); Hyperlipidemia; Depression; and Abnormality of gait (06/10/2012). here with:  1. Multiple sclerosis 2. Abnormality of gait  The patient continues to have ongoing symptoms some of which she feels has worsened. The patient's last MRI did show a new lesion. We will repeat an MRI of the brain. I will check blood work today. For now she will continue on Betaseron. She is encouraged to use either her cane or walker at all times. She will follow-up in 6 months with Dr. Jannifer Franklin.   Ward Givens, MSN, NP-C 02/15/2015, 5:09 PM [facility] [address], [place], [place] [address] [phone]

## 2015-02-16 ENCOUNTER — Telehealth: Payer: Self-pay

## 2015-02-16 LAB — COMPREHENSIVE METABOLIC PANEL
ALT: 47 IU/L — AB (ref 0–32)
AST: 35 IU/L (ref 0–40)
Albumin/Globulin Ratio: 1.3 (ref 1.1–2.5)
Albumin: 4.7 g/dL (ref 3.5–5.5)
Alkaline Phosphatase: 115 IU/L (ref 39–117)
BUN/Creatinine Ratio: 14 (ref 9–23)
BUN: 13 mg/dL (ref 6–24)
Bilirubin Total: 0.2 mg/dL (ref 0.0–1.2)
CALCIUM: 10.2 mg/dL (ref 8.7–10.2)
CO2: 24 mmol/L (ref 18–29)
CREATININE: 0.9 mg/dL (ref 0.57–1.00)
Chloride: 103 mmol/L (ref 96–106)
GFR calc Af Amer: 83 mL/min/{1.73_m2} (ref >59)
GFR, EST NON AFRICAN AMERICAN: 72 mL/min/{1.73_m2} (ref >59)
Globulin, Total: 3.5 g/dL (ref 1.5–4.5)
Glucose: 86 mg/dL (ref 65–99)
Potassium: 4.5 mmol/L (ref 3.5–5.2)
Sodium: 144 mmol/L (ref 134–144)
Total Protein: 8.2 g/dL (ref 6.0–8.5)

## 2015-02-16 LAB — CBC WITH DIFFERENTIAL/PLATELET
BASOS: 1 %
Basophils Absolute: 0 10*3/uL (ref 0.0–0.2)
EOS (ABSOLUTE): 0.2 10*3/uL (ref 0.0–0.4)
EOS: 5 %
HEMATOCRIT: 39.6 % (ref 34.0–46.6)
Hemoglobin: 12.6 g/dL (ref 11.1–15.9)
IMMATURE GRANS (ABS): 0 10*3/uL (ref 0.0–0.1)
Immature Granulocytes: 0 %
LYMPHS: 68 %
Lymphocytes Absolute: 3.2 10*3/uL — ABNORMAL HIGH (ref 0.7–3.1)
MCH: 24.6 pg — ABNORMAL LOW (ref 26.6–33.0)
MCHC: 31.8 g/dL (ref 31.5–35.7)
MCV: 77 fL — ABNORMAL LOW (ref 79–97)
MONOCYTES: 6 %
Monocytes Absolute: 0.3 10*3/uL (ref 0.1–0.9)
NEUTROS PCT: 20 %
Neutrophils Absolute: 0.9 10*3/uL — ABNORMAL LOW (ref 1.4–7.0)
Platelets: 268 10*3/uL (ref 150–379)
RBC: 5.13 x10E6/uL (ref 3.77–5.28)
RDW: 17 % — ABNORMAL HIGH (ref 12.3–15.4)
WBC: 4.6 10*3/uL (ref 3.4–10.8)

## 2015-02-16 NOTE — Telephone Encounter (Signed)
Called patient. Gave lab results. Patient verbalized understanding.  

## 2015-02-16 NOTE — Telephone Encounter (Signed)
-----   Message from Butch Penny, NP sent at 02/16/2015  9:56 AM EST ----- Lab work ok. ALT slight elevated but consistent with previous lab work. All other lab consistent with previous lab work. Please call the patient.

## 2015-03-09 ENCOUNTER — Telehealth: Payer: Self-pay

## 2015-03-09 NOTE — Telephone Encounter (Signed)
PA for betaseron approved through 01/21/2016. PA# [id number]

## 2015-03-23 ENCOUNTER — Ambulatory Visit
Admission: RE | Admit: 2015-03-23 | Discharge: 2015-03-23 | Disposition: A | Discharge status: Home / Self Care | Payer: Medicare Other | Source: Ambulatory Visit | Attending: Adult Health | Admitting: Adult Health

## 2015-03-23 DIAGNOSIS — G35 Multiple sclerosis: Secondary | ICD-10-CM | POA: Diagnosis not present

## 2015-03-23 MED ORDER — GADOBENATE DIMEGLUMINE 529 MG/ML IV SOLN
13.0000 mL | Freq: Once | INTRAVENOUS | Status: AC | PRN
  Administered 2015-03-23: 13 mL via INTRAVENOUS

## 2015-03-27 ENCOUNTER — Telehealth: Payer: Self-pay | Admitting: *Deleted

## 2015-03-27 NOTE — Telephone Encounter (Signed)
I called pt and let her know that the MRI brain results showed no significant change from previous MRI.  Pt verbalized understanding.

## 2015-03-27 NOTE — Telephone Encounter (Signed)
-----   Message from Butch Penny, NP sent at 03/27/2015 10:14 AM EST ----- No significant change when compared to previous scan. Please call patient.

## 2015-04-16 ENCOUNTER — Encounter (HOSPITAL_COMMUNITY): Payer: Self-pay

## 2015-04-16 DIAGNOSIS — Z8639 Personal history of other endocrine, nutritional and metabolic disease: Secondary | ICD-10-CM | POA: Diagnosis not present

## 2015-04-16 DIAGNOSIS — K8020 Calculus of gallbladder without cholecystitis without obstruction: Secondary | ICD-10-CM | POA: Insufficient documentation

## 2015-04-16 DIAGNOSIS — R109 Unspecified abdominal pain: Secondary | ICD-10-CM | POA: Diagnosis present

## 2015-04-16 DIAGNOSIS — M549 Dorsalgia, unspecified: Secondary | ICD-10-CM | POA: Diagnosis not present

## 2015-04-16 DIAGNOSIS — G35 Multiple sclerosis: Secondary | ICD-10-CM | POA: Insufficient documentation

## 2015-04-16 DIAGNOSIS — F329 Major depressive disorder, single episode, unspecified: Secondary | ICD-10-CM | POA: Diagnosis not present

## 2015-04-16 DIAGNOSIS — Z79899 Other long term (current) drug therapy: Secondary | ICD-10-CM | POA: Diagnosis not present

## 2015-04-16 DIAGNOSIS — R1011 Right upper quadrant pain: Secondary | ICD-10-CM | POA: Diagnosis not present

## 2015-04-16 LAB — COMPREHENSIVE METABOLIC PANEL
ALT: 21 U/L (ref 14–54)
AST: 26 U/L (ref 15–41)
Albumin: 4.2 g/dL (ref 3.5–5.0)
Alkaline Phosphatase: 84 U/L (ref 38–126)
Anion gap: 13 (ref 5–15)
BUN: 12 mg/dL (ref 6–20)
CHLORIDE: 107 mmol/L (ref 101–111)
CO2: 22 mmol/L (ref 22–32)
Calcium: 9.9 mg/dL (ref 8.9–10.3)
Creatinine, Ser: 0.91 mg/dL (ref 0.44–1.00)
GFR calc Af Amer: >60 mL/min (ref >60)
GFR calc non Af Amer: >60 mL/min (ref >60)
Glucose, Bld: 93 mg/dL (ref 65–99)
POTASSIUM: 3.6 mmol/L (ref 3.5–5.1)
Sodium: 142 mmol/L (ref 135–145)
Total Protein: 8.5 g/dL — ABNORMAL HIGH (ref 6.5–8.1)

## 2015-04-16 LAB — CBC
HEMATOCRIT: 39.3 % (ref 36.0–46.0)
Hemoglobin: 12.9 g/dL (ref 12.0–15.0)
MCH: 24.9 pg — ABNORMAL LOW (ref 26.0–34.0)
MCHC: 32.8 g/dL (ref 30.0–36.0)
MCV: 75.7 fL — AB (ref 78.0–100.0)
Platelets: 266 10*3/uL (ref 150–400)
RBC: 5.19 MIL/uL — ABNORMAL HIGH (ref 3.87–5.11)
RDW: 14.8 % (ref 11.5–15.5)
WBC: 6.6 10*3/uL (ref 4.0–10.5)

## 2015-04-16 LAB — LIPASE, BLOOD: LIPASE: 34 U/L (ref 11–51)

## 2015-04-16 NOTE — ED Notes (Signed)
Onset 2 days left abd and flank pain.  No urinary symptoms, vomiting or diarrhea.

## 2015-04-17 ENCOUNTER — Emergency Department (HOSPITAL_COMMUNITY): Payer: Medicare Other

## 2015-04-17 ENCOUNTER — Emergency Department (HOSPITAL_COMMUNITY)
Admission: EM | Admit: 2015-04-17 | Discharge: 2015-04-17 | Disposition: A | Discharge status: Home / Self Care | Payer: Medicare Other | Attending: Emergency Medicine | Admitting: Emergency Medicine

## 2015-04-17 DIAGNOSIS — K8020 Calculus of gallbladder without cholecystitis without obstruction: Secondary | ICD-10-CM | POA: Diagnosis not present

## 2015-04-17 DIAGNOSIS — M549 Dorsalgia, unspecified: Secondary | ICD-10-CM | POA: Diagnosis not present

## 2015-04-17 DIAGNOSIS — Z9049 Acquired absence of other specified parts of digestive tract: Secondary | ICD-10-CM | POA: Diagnosis present

## 2015-04-17 DIAGNOSIS — R109 Unspecified abdominal pain: Secondary | ICD-10-CM

## 2015-04-17 LAB — URINALYSIS, ROUTINE W REFLEX MICROSCOPIC
BILIRUBIN URINE: NEGATIVE
Glucose, UA: NEGATIVE mg/dL
Ketones, ur: NEGATIVE mg/dL
NITRITE: POSITIVE — AB
PH: 5.5 (ref 5.0–8.0)
PROTEIN: NEGATIVE mg/dL
Specific Gravity, Urine: 1.009 (ref 1.005–1.030)

## 2015-04-17 LAB — URINE MICROSCOPIC-ADD ON

## 2015-04-17 MED ORDER — TRAMADOL HCL 50 MG PO TABS: 50.0000 mg | ORAL_TABLET | Freq: Two times a day (BID) | ORAL | Status: DC | PRN

## 2015-04-17 NOTE — ED Notes (Signed)
PT WAITING FOR RESULTS OF HER C-T SCAN

## 2015-04-17 NOTE — ED Notes (Signed)
The pt is c/o lt sided abd pain  For 2 days no n v no temp  Her pain is better  Since she arrived here

## 2015-04-17 NOTE — ED Notes (Signed)
The pt returned from xray 

## 2015-04-17 NOTE — Discharge Instructions (Signed)
Cholelithiasis Kristen Boyle, your ultrasound shows gallstones. Take Tylenol as needed for your pain, use tramadol for severe pain. Avoid fatty meals. See surgery within 3 days for close follow-up. If any symptoms worsen, come back to emergency department immediately. Thank you. Cholelithiasis (also called gallstones) is a form of gallbladder disease. The gallbladder is a small organ that helps you digest fats. Symptoms of gallstones are:  Feeling sick to your stomach (nausea).  Throwing up (vomiting).  Belly pain.  Yellowing of the skin (jaundice).  Sudden pain. You may feel the pain for minutes to hours.  Fever.  Pain to the touch. HOME CARE  Only take medicines as told by your doctor.  Eat a low-fat diet until you see your doctor again. Eating fat can result in pain.  Follow up with your doctor as told. Attacks usually happen time after time. Surgery is usually needed for permanent treatment. GET HELP RIGHT AWAY IF:   Your pain gets worse.  Your pain is not helped by medicines.  You have a fever and lasting symptoms for more than 2-3 days.  You have a fever and your symptoms suddenly get worse.  You keep feeling sick to your stomach and throwing up. MAKE SURE YOU:   Understand these instructions.  Will watch your condition.  Will get help right away if you are not doing well or get worse.   This information is not intended to replace advice given to you by your health care provider. Make sure you discuss any questions you have with your health care provider.   Document Released: 06/26/2007 Document Revised: 09/09/2012 Document Reviewed: 07/01/2012 Elsevier Interactive Patient Education [employer]

## 2015-04-17 NOTE — ED Provider Notes (Signed)
CSN: [id number]     Arrival date & time 04/16/15  2219 History  By signing my name below, I, Altamease Oiler, attest that this documentation has been prepared under the direction and in the presence of Everlene Balls, MD. Electronically Signed: Altamease Oiler, ED Scribe. 04/17/2015. 1:35 AM   Chief Complaint  Patient presents with  . Abdominal Pain  . Flank Pain   The history is provided by the patient. No language interpreter was used.   Kristen Boyle is a 55 y.o. female who presents to the Emergency Department complaining of intermittent, dull/aching, 8/10 in severity, left-sided abdominal pain with onset yesterday. The pain radiates to the left flank and up the back. Ibuprofen provided insufficient pain relief at home. Pt states that she has had this pain in the past, "off and on". She has not been eating due to the pain. Pt denies vomiting, diarrhea, dysuria, and hematuria. Past surgical history includes tubal ligation but no other abdominal surgery.   Past Medical History  Diagnosis Date  . MS (multiple sclerosis) ([place])   . Hyperlipidemia   . Depression   . Abnormality of gait 06/10/2012   Past Surgical History  Procedure Laterality Date  . Tubal ligation Bilateral    Family History  Problem Relation Age of Onset  . Colon cancer Maternal Aunt   . Diabetes Paternal Aunt   . Dementia Paternal Grandmother     Senile dementia of Alzheimer's type  . Colon cancer Cousin    Social History  Substance Use Topics  . Smoking status: Never Smoker   . Smokeless tobacco: Never Used  . Alcohol Use: No   OB History    No data available     Review of Systems  10 Systems reviewed and all are negative for acute change except as noted in the HPI.  Allergies  Review of patient's allergies indicates no known allergies.  Home Medications   Prior to Admission medications   Medication Sig Start Date End Date Taking? Authorizing Provider  BETASERON 0.3 MG KIT injection Inject  0.'25mg'$  (2m) sub-q every other day 08/24/14  Yes CLarey Seat MD  ibuprofen (ADVIL,MOTRIN) 600 MG tablet Take 1 tablet (600 mg total) by mouth every 6 (six) hours as needed. 09/09/13  Yes MJuluis Mire MD  PARoxetine (PAXIL) 20 MG tablet Take 1 tablet (20 mg total) by mouth daily. 10/14/14 10/14/15  MJuluis Mire MD  vitamin C (ASCORBIC ACID) 500 MG tablet Take 500 mg by mouth daily.    Historical Provider, MD  Vitamin D, Cholecalciferol, 1000 UNITS TABS Take 2,000 Units by mouth daily at 12 noon. 04/20/14   Marjan Rabbani, MD   BP 160/99 mmHg  Pulse 91  Temp(Src) 97.9 F (36.6 C) (Oral)  Resp 16  Ht 5' (1.524 m)  Wt 147 lb 3.2 oz (66.769 kg)  BMI 28.75 kg/m2  SpO2 99% Physical Exam  Constitutional: She is oriented to person, place, and time. She appears well-developed and well-nourished. No distress.  HENT:  Head: Normocephalic and atraumatic.  Nose: Nose normal.  Mouth/Throat: Oropharynx is clear and moist. No oropharyngeal exudate.  Eyes: Conjunctivae and EOM are normal. Pupils are equal, round, and reactive to light. No scleral icterus.  Neck: Normal range of motion. Neck supple. No JVD present. No tracheal deviation present. No thyromegaly present.  Cardiovascular: Normal rate, regular rhythm and normal heart sounds.  Exam reveals no gallop and no friction rub.   No murmur heard. Pulmonary/Chest: Effort normal and breath sounds normal.  No respiratory distress. She has no wheezes. She exhibits no tenderness.  Abdominal: Soft. Bowel sounds are normal. She exhibits no distension and no mass. There is tenderness in the right upper quadrant. There is no rebound and no guarding.  Musculoskeletal: Normal range of motion. She exhibits no edema or tenderness.  Lymphadenopathy:    She has no cervical adenopathy.  Neurological: She is alert and oriented to person, place, and time. No cranial nerve deficit. She exhibits normal muscle tone.  Skin: Skin is warm and dry. No rash noted. No  erythema. No pallor.  Nursing note and vitals reviewed.   ED Course  Procedures (including critical care time) DIAGNOSTIC STUDIES: Oxygen Saturation is 99% on RA,  normal by my interpretation.    COORDINATION OF CARE: 1:30 AM Discussed treatment plan which includes lab work, [place], XR with pt at bedside and pt agreed to plan. Pt declines pain medication at the time of initial assessment.   Labs Review Labs Reviewed  COMPREHENSIVE METABOLIC PANEL - Abnormal; Notable for the following:    Total Protein 8.5 (*)    Total Bilirubin <0.1 (*)    All other components within normal limits  CBC - Abnormal; Notable for the following:    RBC 5.19 (*)    MCV 75.7 (*)    MCH 24.9 (*)    All other components within normal limits  LIPASE, BLOOD  URINALYSIS, ROUTINE W REFLEX MICROSCOPIC (NOT AT [facility])    Imaging Review Dg Chest 2 View  04/17/2015  CLINICAL DATA:  55 year old female with left-sided back pain EXAM: CHEST  2 VIEW COMPARISON:  None. FINDINGS: The heart size and mediastinal contours are within normal limits. Both lungs are clear. The visualized skeletal structures are unremarkable. IMPRESSION: No active cardiopulmonary disease. Electronically Signed   By: Anner Crete M.D.   On: 04/17/2015 02:22   US Abdomen Limited Ruq  04/17/2015  CLINICAL DATA:  55 year old female with right upper quadrant abdominal pain EXAM: US ABDOMEN LIMITED - RIGHT UPPER QUADRANT COMPARISON:  None. FINDINGS: Gallbladder: There multiple stones within the gallbladder. There is no gallbladder wall thickening or pericholecystic fluid. Negative sonographic Murphy's sign. Common bile duct: Diameter: 5 mm Liver: No focal lesion identified. Within normal limits in parenchymal echogenicity. IMPRESSION: Cholelithiasis without sonographic evidence of acute cholecystitis. A hepatobiliary scintigraphy may provide better evaluation of the gallbladder if an acute cholecystitis is clinically suspected. Electronically Signed   By:  Anner Crete M.D.   On: 04/17/2015 02:30   I have personally reviewed and evaluated these images and lab results as part of my medical decision-making.   EKG Interpretation None      MDM   Final diagnoses:  None    Patient presents to the ED for abdominal pain.  She has pain to her RUQ.  US reveals cholelithiasis without any infection.  She is not requesting anything for pain control.  Labs are normal.  Education was provided regarding her gallstones. Will give tramadol for severe pain and surgical follow up.  She appears well and in NAD.  VS remain within her normal limits and she is safe for DC.     I personally performed the services described in this documentation, which was scribed in my presence. The recorded information has been reviewed and is accurate.      Everlene Balls, MD 04/17/15 [phone]

## 2015-04-18 ENCOUNTER — Telehealth: Payer: Self-pay | Admitting: Internal Medicine

## 2015-04-18 NOTE — Telephone Encounter (Signed)
PT NEEDS MEDICATION FOR PAIN, TRAMADOL NOT WORKING

## 2015-04-18 NOTE — Telephone Encounter (Signed)
Spoke w/ pt who stated he daughter had called and i needed to speak w/ her, spoke w/ daughter, she states pt needs to be seen for "gallstones", she will call back thurs pm for fri appt or if clinic has cancellations tomorrow i will call her to set appt

## 2015-04-19 NOTE — Telephone Encounter (Signed)
Agree with plan that she needs to be seen for symptomatic cholelithiasis. Thanks!  Dr. Johna Roles

## 2015-04-19 NOTE — Telephone Encounter (Signed)
No open appts the remainder of week

## 2015-04-20 NOTE — Telephone Encounter (Signed)
Tried to call pt to offer appt for Friday, tried both #'s, no answer, no vmail

## 2015-04-27 ENCOUNTER — Encounter: Payer: Self-pay | Admitting: Internal Medicine

## 2015-04-27 ENCOUNTER — Ambulatory Visit (INDEPENDENT_AMBULATORY_CARE_PROVIDER_SITE_OTHER): Payer: Medicare Other | Admitting: Internal Medicine

## 2015-04-27 VITALS — BP 131/89 | HR 75 | Temp 97.9°F | Wt 143.8 lb

## 2015-04-27 DIAGNOSIS — K8020 Calculus of gallbladder without cholecystitis without obstruction: Secondary | ICD-10-CM | POA: Diagnosis present

## 2015-04-27 MED ORDER — TRAMADOL HCL 50 MG PO TABS
50.0000 mg | ORAL_TABLET | Freq: Two times a day (BID) | ORAL | Status: DC | PRN
Start: 2015-04-27 — End: 2015-08-18

## 2015-04-27 NOTE — Patient Instructions (Signed)
If you develop fever, nausea, vomiting, or notice your skin turning yellow call the clinic or go to the Emergency department.

## 2015-05-01 NOTE — Assessment & Plan Note (Addendum)
Pt seen in the ED 3/27 for abd pain and found to have multiple gallstones, she was d/c'd home w/ tramadol. Since then she has had intermittent, crampy abd pain throughout the day and w/ meals. She does eat creamed soups due to not having teeth. Denies fevers, n/v. Has some abd pain that she rates at 5/10 in severity and goes away w/ tramadol. She does not have cholecystisits. Educated on symptoms of cholecystitis. Due to multiple gallstones present on u/s advised pt to see general sx to consider an elective cholecystectomy.   - referral placed for gen sx. - refilled tramadol

## 2015-05-01 NOTE — Progress Notes (Signed)
   Subjective:   Patient ID: Kristen Boyle female   DOB: Apr 04, 1960 55 y.o.   MRN: [id number]  HPI: [name] is a 55 y.o. with past medical history as outlined below who presents to clinic for ED f/u for cholelithiasis.   Please see problem list for status of the pt's chronic medical problems.  Past Medical History  Diagnosis Date  . MS (multiple sclerosis) ([place])   . Hyperlipidemia   . Depression   . Abnormality of gait 06/10/2012   Current Outpatient Prescriptions  Medication Sig Dispense Refill  . BETASERON 0.3 MG KIT injection Inject 0.'25mg'$  (23m) sub-q every other day 14 kit 11  . ibuprofen (ADVIL,MOTRIN) 600 MG tablet Take 1 tablet (600 mg total) by mouth every 6 (six) hours as needed. 30 tablet 3  . traMADol (ULTRAM) 50 MG tablet Take 1 tablet (50 mg total) by mouth every 12 (twelve) hours as needed. 60 tablet 0   No current facility-administered medications for this visit.   Family History  Problem Relation Age of Onset  . Colon cancer Maternal Aunt   . Diabetes Paternal Aunt   . Dementia Paternal Grandmother     Senile dementia of Alzheimer's type  . Colon cancer Cousin    Social History   Social History  . Marital Status: Single    Spouse Name: N/A  . Number of Children: 4  . Years of Education: 2-College   Occupational History  .      Disabled   Social History Main Topics  . Smoking status: Never Smoker   . Smokeless tobacco: Never Used  . Alcohol Use: No  . Drug Use: No  . Sexual Activity: No   Other Topics Concern  . None   Social History Narrative   Patient lives at home with her daughter.   Disabled.   Education two years of college.   Right handed.   Caffeine one cup of coffee not daily.   Review of Systems: Review of Systems  Constitutional: Negative for fever and chills.  HENT:       Had teeth removed, eating soft/mushy creamy food.   Respiratory: Negative for sputum production.   Cardiovascular: Negative for chest  pain.  Gastrointestinal: Positive for abdominal pain (currently 5/10, intermittent, associated w/ eating, well controlled w/ tramadol). Negative for nausea, vomiting and diarrhea.  Genitourinary: Negative for flank pain.  Musculoskeletal: Negative for back pain.    Objective:  Physical Exam: Filed Vitals:   04/27/15 1504  BP: 131/89  Pulse: 75  Temp: 97.9 F (36.6 C)  TempSrc: Oral  Weight: 143 lb 12.8 oz (65.227 kg)  SpO2: 100%   Physical Exam  Constitutional: She appears well-developed and well-nourished. No distress.  HENT:  Head: Normocephalic and atraumatic.  Nose: Nose normal.  Eyes: Conjunctivae and EOM are normal. No scleral icterus.  Cardiovascular: Normal rate, regular rhythm and normal heart sounds.  Exam reveals no gallop and no friction rub.   No murmur heard. Pulmonary/Chest: Effort normal and breath sounds normal. No respiratory distress. She has no wheezes. She has no rales.  Abdominal: Soft. Bowel sounds are normal. She exhibits no distension and no mass. There is no tenderness. There is no rebound and no guarding.  Skin: Skin is warm and dry. No rash noted. She is not diaphoretic. No erythema. No pallor.   Assessment & Plan:   Please see problem based assessment and plan.

## 2015-05-02 NOTE — Progress Notes (Signed)
Internal Medicine Clinic Attending  Case discussed with Dr. Truong at the time of the visit.  We reviewed the resident's history and exam and pertinent patient test results.  I agree with the assessment, diagnosis, and plan of care documented in the resident's note.  

## 2015-05-22 ENCOUNTER — Ambulatory Visit: Payer: Self-pay | Admitting: General Surgery

## 2015-05-22 DIAGNOSIS — K8020 Calculus of gallbladder without cholecystitis without obstruction: Secondary | ICD-10-CM | POA: Diagnosis not present

## 2015-05-22 NOTE — H&P (Signed)
History of Present Illness Kristen Ok MD; 05/22/2015 11:08 AM) The patient is a 55 year old female who presents for evaluation of gall stones. The patient is a 55 year old female with symptomatic cholelithiasis her last 6 months. Patient states that she had dental work and thereafter after eating creamy soup she had some epigastric abdominal pain which radiated to her back. Patient was seen ER secondary to this abdominal pain she underwent ultrasound. Ultrasound revealed: FINDINGS: Gallbladder:  There multiple stones within the gallbladder. There is no gallbladder wall thickening or pericholecystic fluid. Negative sonographic Murphy's sign.  Common bile duct:  Diameter: 5 mm  Liver:  No focal lesion identified. Within normal limits in parenchymal echogenicity.  IMPRESSION: Cholelithiasis without sonographic evidence of acute cholecystitis. A hepatobiliary scintigraphy may provide better evaluation of the gallbladder if an acute cholecystitis is clinically suspected.   Other Problems Elbert Ewings, CMA; 05/22/2015 10:57 AM) Back Pain Cholelithiasis  Diagnostic Studies History Elbert Ewings, CMA; 05/22/2015 10:57 AM) Colonoscopy 1-5 years ago Pap Smear 1-5 years ago  Allergies Elbert Ewings, CMA; 05/22/2015 10:57 AM) No Known Drug Allergies05/01/2015  Medication History Elbert Ewings, CMA; 05/22/2015 10:57 AM) Betaseron (0.'3MG'$  Kit, Subcutaneous) Active. TraMADol HCl ('50MG'$  Tablet, Oral) Active. Medications Reconciled  Social History Elbert Ewings, [place]; 05/22/2015 10:57 AM) Caffeine use Carbonated beverages, Coffee. No alcohol use No drug use Tobacco use Never smoker.  Family History Elbert Ewings, [place]; 05/22/2015 10:57 AM) Alcohol Abuse Father. Depression Mother.  Pregnancy / Birth History Elbert Ewings, CMA; 05/22/2015 10:57 AM) Age at menarche 41 years. Age of menopause <43 Gravida 16 Maternal age 73-20 Para 4    Review of Systems Elbert Ewings CMA;  05/22/2015 10:57 AM) General Present- Appetite Loss, Fatigue and Night Sweats. Not Present- Chills, Fever, Weight Gain and Weight Loss. Skin Not Present- Change in Wart/Mole, Dryness, Hives, Jaundice, New Lesions, Non-Healing Wounds, Rash and Ulcer. HEENT Not Present- Earache, Hearing Loss, Hoarseness, Nose Bleed, Oral Ulcers, Ringing in the Ears, Seasonal Allergies, Sinus Pain, Sore Throat, Visual Disturbances, Wears glasses/contact lenses and Yellow Eyes. Respiratory Present- Snoring. Not Present- Bloody sputum, Chronic Cough, Difficulty Breathing and Wheezing. Breast Not Present- Breast Mass, Breast Pain, Nipple Discharge and Skin Changes. Cardiovascular Present- Swelling of Extremities. Not Present- Chest Pain, Difficulty Breathing Lying Down, Leg Cramps, Palpitations, Rapid Heart Rate and Shortness of Breath. Gastrointestinal Present- Constipation. Not Present- Abdominal Pain, Bloating, Bloody Stool, Change in Bowel Habits, Chronic diarrhea, Difficulty Swallowing, Excessive gas, Gets full quickly at meals, Hemorrhoids, Indigestion, Nausea, Rectal Pain and Vomiting. Female Genitourinary Not Present- Frequency, Nocturia, Painful Urination, Pelvic Pain and Urgency. Musculoskeletal Present- Back Pain, Muscle Weakness and Swelling of Extremities. Not Present- Joint Pain, Joint Stiffness and Muscle Pain. Neurological Present- Decreased Memory, Trouble walking and Weakness. Not Present- Fainting, Headaches, Numbness, Seizures, Tingling and Tremor. Psychiatric Present- Depression. Not Present- Anxiety, Bipolar, Change in Sleep Pattern, Fearful and Frequent crying. Endocrine Present- Hot flashes. Not Present- Cold Intolerance, Excessive Hunger, Hair Changes, Heat Intolerance and New Diabetes. Hematology Not Present- Easy Bruising, Excessive bleeding, Gland problems, HIV and Persistent Infections.  Vitals Elbert Ewings CMA; 05/22/2015 10:58 AM) 05/22/2015 10:57 AM Weight: 141 lb Height: 60in Body Surface  Area: 1.61 m Body Mass Index: 27.54 kg/m  Temp.: 97.5F  Pulse: 80 (Regular)  BP: 126/92 (Sitting, Left Arm, Standard)       Physical Exam Kristen Ok, MD; 05/22/2015 11:9 AM) General Mental Status-Alert. General Appearance-Consistent with stated age. Hydration-Well hydrated. Voice-Normal.  Head and Neck Head-normocephalic, atraumatic with no lesions or  palpable masses.  Eye Eyeball - Bilateral-Extraocular movements intact. Sclera/Conjunctiva - Bilateral-No scleral icterus.  Chest and Lung Exam Chest and lung exam reveals -quiet, even and easy respiratory effort with no use of accessory muscles. Inspection Chest Wall - Normal. Back - normal.  Cardiovascular Cardiovascular examination reveals -normal heart sounds, regular rate and rhythm with no murmurs.  Abdomen Inspection Normal Exam - No Hernias. Palpation/Percussion Normal exam - Soft, Non Tender, No Rebound tenderness, No Rigidity (guarding) and No hepatosplenomegaly. Auscultation Normal exam - Bowel sounds normal.  Neurologic Neurologic evaluation reveals -alert and oriented x 3 with no impairment of recent or remote memory. Mental Status-Normal.  Musculoskeletal Normal Exam - Left-Upper Extremity Strength Normal and Lower Extremity Strength Normal. Normal Exam - Right-Upper Extremity Strength Normal, Lower Extremity Weakness.    Assessment & Plan Kristen Ok MD; 05/22/2015 11:08 AM) SYMPTOMATIC CHOLELITHIASIS (K80.20) Impression: 55 year old female with symptomatic cholelithiasis  1. We will proceed to the operating room for a laparoscopic cholecystectomy 2. Risks and benefits were discussed with the patient to generally include, but not limited to: infection, bleeding, possible need for post op ERCP, damage to the bile ducts, bile leak, and possible need for further surgery. Alternatives were offered and described. All questions were answered and the patient  voiced understanding of the procedure and wishes to proceed at this point with a laparoscopic cholecystectomy

## 2015-06-14 ENCOUNTER — Other Ambulatory Visit (HOSPITAL_COMMUNITY): Payer: Medicare Other

## 2015-06-14 NOTE — Patient Instructions (Addendum)
Kristen Boyle  06/14/2015   Your procedure is scheduled on: Tuesday 06/20/2015  Report to [facility] Main  Entrance take [place]  elevators to 3rd floor to  Short Stay Center at  0930 AM.  Call this number if you have problems the morning of surgery [phone]   Remember: ONLY 1 PERSON MAY GO WITH YOU TO SHORT STAY TO GET  READY MORNING OF YOUR SURGERY.   Do not eat food or drink liquids :After Midnight.     Take these medicines the morning of surgery with A SIP OF WATER: none                               You may not have any metal on your body including hair pins and              piercings  Do not wear jewelry, make-up, lotions, powders or perfumes, deodorant             Do not wear nail polish.  Do not shave  48 hours prior to surgery.              Men may shave face and neck.   Do not bring valuables to the hospital. [facility] IS NOT             RESPONSIBLE   FOR VALUABLES.  Contacts, dentures or bridgework may not be worn into surgery.  Leave suitcase in the car. After surgery it may be brought to your room.     Patients discharged the day of surgery will not be allowed to drive home.  Name and phone number of your driver: working on person to come with her but uses Medicaid transportation  Special Instructions: N/A              Please read over the following fact sheets you were given: _____________________________________________________________________             [facility] - Preparing for Surgery Before surgery, you can play an important role.  Because skin is not sterile, your skin needs to be as free of germs as possible.  You can reduce the number of germs on your skin by washing with CHG (chlorahexidine gluconate) soap before surgery.  CHG is an antiseptic cleaner which kills germs and bonds with the skin to continue killing germs even after washing. Please DO NOT use if you have an allergy to CHG or antibacterial soaps.  If your  skin becomes reddened/irritated stop using the CHG and inform your nurse when you arrive at Short Stay. Do not shave (including legs and underarms) for at least 48 hours prior to the first CHG shower.  You may shave your face/neck. Please follow these instructions carefully:  1.  Shower with CHG Soap the night before surgery and the  morning of Surgery.  2.  If you choose to wash your hair, wash your hair first as usual with your  normal  shampoo.  3.  After you shampoo, rinse your hair and body thoroughly to remove the  shampoo.                           4.  Use CHG as you would any other liquid soap.  You can apply chg directly  to the skin  and wash                       Gently with a scrungie or clean washcloth.  5.  Apply the CHG Soap to your body ONLY FROM THE NECK DOWN.   Do not use on face/ open                           Wound or open sores. Avoid contact with eyes, ears mouth and genitals (private parts).                       Wash face,  Genitals (private parts) with your normal soap.             6.  Wash thoroughly, paying special attention to the area where your surgery  will be performed.  7.  Thoroughly rinse your body with warm water from the neck down.  8.  DO NOT shower/wash with your normal soap after using and rinsing off  the CHG Soap.                9.  Pat yourself dry with a clean towel.            10.  Wear clean pajamas.            11.  Place clean sheets on your bed the night of your first shower and do not  sleep with pets. Day of Surgery : Do not apply any lotions/deodorants the morning of surgery.  Please wear clean clothes to the hospital/surgery center.  FAILURE TO FOLLOW THESE INSTRUCTIONS MAY RESULT IN THE CANCELLATION OF YOUR SURGERY PATIENT SIGNATURE_________________________________  NURSE SIGNATURE__________________________________  ________________________________________________________________________   Kristen Boyle  An incentive spirometer  is a tool that can help keep your lungs clear and active. This tool measures how well you are filling your lungs with each breath. Taking long deep breaths may help reverse or decrease the chance of developing breathing (pulmonary) problems (especially infection) following:  A long period of time when you are unable to move or be active. BEFORE THE PROCEDURE   If the spirometer includes an indicator to show your best effort, your nurse or respiratory therapist will set it to a desired goal.  If possible, sit up straight or lean slightly forward. Try not to slouch.  Hold the incentive spirometer in an upright position. INSTRUCTIONS FOR USE  1. Sit on the edge of your bed if possible, or sit up as far as you can in bed or on a chair. 2. Hold the incentive spirometer in an upright position. 3. Breathe out normally. 4. Place the mouthpiece in your mouth and seal your lips tightly around it. 5. Breathe in slowly and as deeply as possible, raising the piston or the ball toward the top of the column. 6. Hold your breath for 3-5 seconds or for as long as possible. Allow the piston or ball to fall to the bottom of the column. 7. Remove the mouthpiece from your mouth and breathe out normally. 8. Rest for a few seconds and repeat Steps 1 through 7 at least 10 times every 1-2 hours when you are awake. Take your time and take a few normal breaths between deep breaths. 9. The spirometer may include an indicator to show your best effort. Use the indicator as a goal to work toward during each repetition. 10. After each set of 10  deep breaths, practice coughing to be sure your lungs are clear. If you have an incision (the cut made at the time of surgery), support your incision when coughing by placing a pillow or rolled up towels firmly against it. Once you are able to get out of bed, walk around indoors and cough well. You may stop using the incentive spirometer when instructed by your caregiver.  RISKS AND  COMPLICATIONS  Take your time so you do not get dizzy or light-headed.  If you are in pain, you may need to take or ask for pain medication before doing incentive spirometry. It is harder to take a deep breath if you are having pain. AFTER USE  Rest and breathe slowly and easily.  It can be helpful to keep track of a log of your progress. Your caregiver can provide you with a simple table to help with this. If you are using the spirometer at home, follow these instructions: [place] IF:   You are having difficultly using the spirometer.  You have trouble using the spirometer as often as instructed.  Your pain medication is not giving enough relief while using the spirometer.  You develop fever of 100.5 F (38.1 C) or higher. SEEK IMMEDIATE MEDICAL CARE IF:   You cough up bloody sputum that had not been present before.  You develop fever of 102 F (38.9 C) or greater.  You develop worsening pain at or near the incision site. MAKE SURE YOU:   Understand these instructions.  Will watch your condition.  Will get help right away if you are not doing well or get worse. Document Released: 05/20/2006 Document Revised: 04/01/2011 Document Reviewed: 07/21/2006 [facility] Patient Information 2014 [place], [place].   ________________________________________________________________________

## 2015-06-15 ENCOUNTER — Encounter (HOSPITAL_COMMUNITY): Payer: Self-pay

## 2015-06-15 ENCOUNTER — Encounter (HOSPITAL_COMMUNITY)
Admission: RE | Admit: 2015-06-15 | Discharge: 2015-06-15 | Disposition: A | Discharge status: Home / Self Care | Payer: Medicare Other | Source: Ambulatory Visit | Attending: General Surgery | Admitting: General Surgery

## 2015-06-15 DIAGNOSIS — K8080 Other cholelithiasis without obstruction: Secondary | ICD-10-CM | POA: Insufficient documentation

## 2015-06-15 DIAGNOSIS — Z01812 Encounter for preprocedural laboratory examination: Secondary | ICD-10-CM | POA: Insufficient documentation

## 2015-06-15 HISTORY — DX: Headache, unspecified: R51.9

## 2015-06-15 HISTORY — DX: Headache: R51

## 2015-06-15 LAB — CBC
HCT: 38.7 % (ref 36.0–46.0)
Hemoglobin: 12.3 g/dL (ref 12.0–15.0)
MCH: 24.4 pg — AB (ref 26.0–34.0)
MCHC: 31.8 g/dL (ref 30.0–36.0)
MCV: 76.8 fL — AB (ref 78.0–100.0)
PLATELETS: 290 10*3/uL (ref 150–400)
RBC: 5.04 MIL/uL (ref 3.87–5.11)
RDW: 15.6 % — AB (ref 11.5–15.5)
WBC: 4.8 10*3/uL (ref 4.0–10.5)

## 2015-06-15 NOTE — Progress Notes (Signed)
04/17/2015-noted in EPIC-2 view CXR

## 2015-06-20 ENCOUNTER — Ambulatory Visit (HOSPITAL_COMMUNITY)
Admission: RE | Admit: 2015-06-20 | Discharge: 2015-06-20 | Disposition: A | Discharge status: Home / Self Care | Payer: Medicare Other | Source: Ambulatory Visit | Attending: General Surgery | Admitting: General Surgery

## 2015-06-20 ENCOUNTER — Encounter (HOSPITAL_COMMUNITY)
Admission: RE | Disposition: A | Discharge status: Home / Self Care | Payer: Self-pay | Source: Ambulatory Visit | Attending: General Surgery

## 2015-06-20 ENCOUNTER — Encounter (HOSPITAL_COMMUNITY): Payer: Self-pay | Admitting: *Deleted

## 2015-06-20 ENCOUNTER — Encounter: Payer: Self-pay | Admitting: Internal Medicine

## 2015-06-20 ENCOUNTER — Ambulatory Visit (HOSPITAL_COMMUNITY): Payer: Medicare Other | Admitting: Certified Registered Nurse Anesthetist

## 2015-06-20 DIAGNOSIS — K8020 Calculus of gallbladder without cholecystitis without obstruction: Secondary | ICD-10-CM | POA: Diagnosis not present

## 2015-06-20 DIAGNOSIS — K8010 Calculus of gallbladder with chronic cholecystitis without obstruction: Secondary | ICD-10-CM | POA: Diagnosis not present

## 2015-06-20 DIAGNOSIS — K8080 Other cholelithiasis without obstruction: Secondary | ICD-10-CM | POA: Diagnosis present

## 2015-06-20 HISTORY — PX: CHOLECYSTECTOMY: SHX55

## 2015-06-20 SURGERY — LAPAROSCOPIC CHOLECYSTECTOMY
Anesthesia: General

## 2015-06-20 MED ORDER — SODIUM CHLORIDE 0.9 % IV SOLN: 250.0000 mL | INTRAVENOUS | Status: DC | PRN

## 2015-06-20 MED ORDER — FENTANYL CITRATE (PF) 100 MCG/2ML IJ SOLN
INTRAMUSCULAR | Status: DC | PRN
  Administered 2015-06-20: 50 ug via INTRAVENOUS
  Administered 2015-06-20: 100 ug via INTRAVENOUS
  Administered 2015-06-20 (×2): 50 ug via INTRAVENOUS

## 2015-06-20 MED ORDER — FENTANYL CITRATE (PF) 250 MCG/5ML IJ SOLN
INTRAMUSCULAR | Status: AC
  Filled 2015-06-20: qty 5

## 2015-06-20 MED ORDER — LACTATED RINGERS IV SOLN
INTRAVENOUS | Status: DC
  Administered 2015-06-20 (×2): via INTRAVENOUS

## 2015-06-20 MED ORDER — HYDROMORPHONE HCL 1 MG/ML IJ SOLN
0.2500 mg | INTRAMUSCULAR | Status: DC | PRN
  Administered 2015-06-20 (×2): 0.5 mg via INTRAVENOUS

## 2015-06-20 MED ORDER — SODIUM CHLORIDE 0.9% FLUSH: 3.0000 mL | Freq: Two times a day (BID) | INTRAVENOUS | Status: DC

## 2015-06-20 MED ORDER — SODIUM CHLORIDE 0.9% FLUSH: 3.0000 mL | INTRAVENOUS | Status: DC | PRN

## 2015-06-20 MED ORDER — ONDANSETRON HCL 4 MG/2ML IJ SOLN
INTRAMUSCULAR | Status: DC | PRN
  Administered 2015-06-20: 10 mg via INTRAVENOUS

## 2015-06-20 MED ORDER — PROPOFOL 10 MG/ML IV BOLUS
INTRAVENOUS | Status: DC | PRN
  Administered 2015-06-20: 130 mg via INTRAVENOUS

## 2015-06-20 MED ORDER — BUPIVACAINE-EPINEPHRINE (PF) 0.25% -1:200000 IJ SOLN
INTRAMUSCULAR | Status: AC
  Filled 2015-06-20: qty 30

## 2015-06-20 MED ORDER — ROCURONIUM BROMIDE 100 MG/10ML IV SOLN
INTRAVENOUS | Status: DC | PRN
  Administered 2015-06-20: 30 mg via INTRAVENOUS

## 2015-06-20 MED ORDER — ROCURONIUM BROMIDE 100 MG/10ML IV SOLN
INTRAVENOUS | Status: AC
  Filled 2015-06-20: qty 1

## 2015-06-20 MED ORDER — MIDAZOLAM HCL 2 MG/2ML IJ SOLN
INTRAMUSCULAR | Status: AC
  Filled 2015-06-20: qty 2

## 2015-06-20 MED ORDER — DEXAMETHASONE SODIUM PHOSPHATE 10 MG/ML IJ SOLN
INTRAMUSCULAR | Status: DC | PRN
  Administered 2015-06-20: 10 mg via INTRAVENOUS

## 2015-06-20 MED ORDER — ONDANSETRON HCL 4 MG/2ML IJ SOLN
INTRAMUSCULAR | Status: AC
  Filled 2015-06-20: qty 2

## 2015-06-20 MED ORDER — MORPHINE SULFATE (PF) 10 MG/ML IV SOLN: 2.0000 mg | INTRAVENOUS | Status: DC | PRN

## 2015-06-20 MED ORDER — ACETAMINOPHEN 650 MG RE SUPP
650.0000 mg | RECTAL | Status: DC | PRN
  Filled 2015-06-20: qty 1

## 2015-06-20 MED ORDER — CEFAZOLIN SODIUM-DEXTROSE 2-4 GM/100ML-% IV SOLN
2.0000 g | INTRAVENOUS | Status: AC
  Administered 2015-06-20: 2 g via INTRAVENOUS
  Filled 2015-06-20: qty 100

## 2015-06-20 MED ORDER — LIDOCAINE HCL (PF) 2 % IJ SOLN
INTRAMUSCULAR | Status: DC | PRN
  Administered 2015-06-20: 30 mg via INTRADERMAL

## 2015-06-20 MED ORDER — SUGAMMADEX SODIUM 200 MG/2ML IV SOLN
INTRAVENOUS | Status: AC
  Filled 2015-06-20: qty 2

## 2015-06-20 MED ORDER — CHLORHEXIDINE GLUCONATE 4 % EX LIQD: 1.0000 "application " | Freq: Once | CUTANEOUS | Status: DC

## 2015-06-20 MED ORDER — ONDANSETRON HCL 4 MG/2ML IJ SOLN: 4.0000 mg | Freq: Once | INTRAMUSCULAR | Status: DC | PRN

## 2015-06-20 MED ORDER — MIDAZOLAM HCL 5 MG/5ML IJ SOLN
INTRAMUSCULAR | Status: DC | PRN
  Administered 2015-06-20: 2 mg via INTRAVENOUS

## 2015-06-20 MED ORDER — ACETAMINOPHEN 325 MG PO TABS: 650.0000 mg | ORAL_TABLET | ORAL | Status: DC | PRN

## 2015-06-20 MED ORDER — DEXAMETHASONE SODIUM PHOSPHATE 10 MG/ML IJ SOLN
INTRAMUSCULAR | Status: AC
  Filled 2015-06-20: qty 1

## 2015-06-20 MED ORDER — HYDROMORPHONE HCL 1 MG/ML IJ SOLN
INTRAMUSCULAR | Status: AC
  Filled 2015-06-20: qty 1

## 2015-06-20 MED ORDER — OXYCODONE-ACETAMINOPHEN 5-325 MG PO TABS: 1.0000 | ORAL_TABLET | ORAL | Status: DC | PRN

## 2015-06-20 MED ORDER — LIDOCAINE HCL (CARDIAC) 20 MG/ML IV SOLN
INTRAVENOUS | Status: AC
  Filled 2015-06-20: qty 5

## 2015-06-20 MED ORDER — SUGAMMADEX SODIUM 200 MG/2ML IV SOLN
INTRAVENOUS | Status: DC | PRN
  Administered 2015-06-20: 200 mg via INTRAVENOUS

## 2015-06-20 MED ORDER — PROPOFOL 10 MG/ML IV BOLUS
INTRAVENOUS | Status: AC
  Filled 2015-06-20: qty 20

## 2015-06-20 MED ORDER — CEFAZOLIN SODIUM-DEXTROSE 2-4 GM/100ML-% IV SOLN
INTRAVENOUS | Status: AC
  Filled 2015-06-20: qty 100

## 2015-06-20 MED ORDER — LACTATED RINGERS IR SOLN
Status: DC | PRN
  Administered 2015-06-20: 1000 mL

## 2015-06-20 MED ORDER — SUCCINYLCHOLINE CHLORIDE 20 MG/ML IJ SOLN
INTRAMUSCULAR | Status: DC | PRN
  Administered 2015-06-20: 100 mg via INTRAVENOUS

## 2015-06-20 MED ORDER — OXYCODONE HCL 5 MG PO TABS
5.0000 mg | ORAL_TABLET | ORAL | Status: DC | PRN
  Administered 2015-06-20: 5 mg via ORAL
  Filled 2015-06-20: qty 1

## 2015-06-20 MED ORDER — BUPIVACAINE-EPINEPHRINE (PF) 0.25% -1:200000 IJ SOLN
INTRAMUSCULAR | Status: DC | PRN
  Administered 2015-06-20: 10 mL

## 2015-06-20 SURGICAL SUPPLY — 42 items
APL SKNCLS STERI-STRIP NONHPOA (GAUZE/BANDAGES/DRESSINGS) ×1
APPLIER CLIP 5 13 M/L LIGAMAX5 (MISCELLANEOUS)
APR CLP MED LRG 5 ANG JAW (MISCELLANEOUS)
BENZOIN TINCTURE PRP APPL 2/3 (GAUZE/BANDAGES/DRESSINGS) ×2 IMPLANT
CABLE HIGH FREQUENCY MONO STRZ (ELECTRODE) ×3 IMPLANT
CHLORAPREP W/TINT 26ML (MISCELLANEOUS) ×3 IMPLANT
CLIP APPLIE 5 13 M/L LIGAMAX5 (MISCELLANEOUS) IMPLANT
CLIP LIGATING HEMO O LOK GREEN (MISCELLANEOUS) ×3 IMPLANT
CLOSURE WOUND 1/2 X4 (GAUZE/BANDAGES/DRESSINGS) ×1
COVER MAYO STAND STRL (DRAPES) IMPLANT
COVER SURGICAL LIGHT HANDLE (MISCELLANEOUS) ×3 IMPLANT
COVER TRANSDUCER ULTRASND (DRAPES) ×3 IMPLANT
DECANTER SPIKE VIAL GLASS SM (MISCELLANEOUS) ×3 IMPLANT
DEVICE TROCAR PUNCTURE CLOSURE (ENDOMECHANICALS) ×3 IMPLANT
DRAPE C-ARM 42X120 X-RAY (DRAPES) IMPLANT
DRAPE LAPAROSCOPIC ABDOMINAL (DRAPES) ×3 IMPLANT
DRAPE UTILITY XL STRL (DRAPES) ×3 IMPLANT
ELECT REM PT RETURN 9FT ADLT (ELECTROSURGICAL) ×3
ELECTRODE REM PT RTRN 9FT ADLT (ELECTROSURGICAL) ×1 IMPLANT
GAUZE SPONGE 2X2 8PLY STRL LF (GAUZE/BANDAGES/DRESSINGS) ×1 IMPLANT
GAUZE SPONGE 4X4 12PLY STRL (GAUZE/BANDAGES/DRESSINGS) ×3 IMPLANT
GLOVE BIO SURGEON STRL SZ7.5 (GLOVE) ×3 IMPLANT
GOWN STRL REUS W/TWL XL LVL3 (GOWN DISPOSABLE) ×6 IMPLANT
HEMOSTAT SURGICEL 4X8 (HEMOSTASIS) IMPLANT
KIT BASIN OR (CUSTOM PROCEDURE TRAY) ×3 IMPLANT
NDL INSUFFLATION 14GA 120MM (NEEDLE) ×1 IMPLANT
NEEDLE INSUFFLATION 14GA 120MM (NEEDLE) ×3 IMPLANT
PAD POSITIONING PINK XL (MISCELLANEOUS) IMPLANT
POSITIONER SURGICAL ARM (MISCELLANEOUS) IMPLANT
SCISSORS LAP 5X35 DISP (ENDOMECHANICALS) ×3 IMPLANT
SET CHOLANGIOGRAPH MIX (MISCELLANEOUS) IMPLANT
SET IRRIG TUBING LAPAROSCOPIC (IRRIGATION / IRRIGATOR) ×3 IMPLANT
SPONGE GAUZE 2X2 STER 10/PKG (GAUZE/BANDAGES/DRESSINGS) ×2
STRIP CLOSURE SKIN 1/2X4 (GAUZE/BANDAGES/DRESSINGS) ×2 IMPLANT
SUT MNCRL AB 4-0 PS2 18 (SUTURE) ×3 IMPLANT
TAPE CLOTH 4X10 WHT NS (GAUZE/BANDAGES/DRESSINGS) IMPLANT
TOWEL OR 17X26 10 PK STRL BLUE (TOWEL DISPOSABLE) ×3 IMPLANT
TOWEL OR NON WOVEN STRL DISP B (DISPOSABLE) ×3 IMPLANT
TRAY LAPAROSCOPIC (CUSTOM PROCEDURE TRAY) ×3 IMPLANT
TROCAR BLADELESS OPT 5 75 (ENDOMECHANICALS) ×3 IMPLANT
TROCAR SLEEVE XCEL 5X75 (ENDOMECHANICALS) ×3 IMPLANT
TROCAR XCEL NON-BLD 11X100MML (ENDOMECHANICALS) ×3 IMPLANT

## 2015-06-20 NOTE — Anesthesia Postprocedure Evaluation (Signed)
Anesthesia Post Note  Patient: Kristen Boyle  Procedure(s) Performed: Procedure(s) (LRB): LAPAROSCOPIC CHOLECYSTECTOMY (N/A)  Patient location during evaluation: PACU Anesthesia Type: General Level of consciousness: awake and alert Pain management: pain level controlled Vital Signs Assessment: post-procedure vital signs reviewed and stable Respiratory status: spontaneous breathing, nonlabored ventilation, respiratory function stable and patient connected to nasal cannula oxygen Cardiovascular status: blood pressure returned to baseline and stable Postop Assessment: no signs of nausea or vomiting Anesthetic complications: no    Last Vitals:  Filed Vitals:   06/20/15 1228 06/20/15 1230  BP: 133/79 132/87  Pulse: 84 80  Temp: 36.4 C   Resp: 16 21    Last Pain:  Filed Vitals:   06/20/15 1251  PainSc: 3                  Reino Kent

## 2015-06-20 NOTE — Interval H&P Note (Signed)
History and Physical Interval Note:  06/20/2015 10:55 AM  Kristen Boyle  has presented today for surgery, with the diagnosis of gallstones  The various methods of treatment have been discussed with the patient and family. After consideration of risks, benefits and other options for treatment, the patient has consented to  Procedure(s): LAPAROSCOPIC CHOLECYSTECTOMY (N/A) as a surgical intervention .  The patient's history has been reviewed, patient examined, no change in status, stable for surgery.  I have reviewed the patient's chart and labs.  Questions were answered to the patient's satisfaction.     Marigene Ehlers., Jed Limerick

## 2015-06-20 NOTE — Discharge Instructions (Signed)
[facility] ______[facility] °LAPAROSCOPIC SURGERY: POST OP INSTRUCTIONS °Always review your discharge instruction sheet given to you by the facility where your surgery was performed. °IF YOU HAVE DISABILITY OR FAMILY LEAVE FORMS, YOU MUST BRING THEM TO THE OFFICE FOR PROCESSING.   °DO NOT GIVE THEM TO YOUR DOCTOR. ° °1. A prescription for pain medication may be given to you upon discharge.  Take your pain medication as prescribed, if needed.  If narcotic pain medicine is not needed, then you may take acetaminophen (Tylenol) or ibuprofen (Advil) as needed. °2. Take your usually prescribed medications unless otherwise directed. °3. If you need a refill on your pain medication, please contact your pharmacy.  They will contact our office to request authorization. Prescriptions will not be filled after 5pm or on week-ends. °4. You should follow a light diet the first few days after arrival home, such as soup and crackers, etc.  Be sure to include lots of fluids daily. °5. Most patients will experience some swelling and bruising in the area of the incisions.  Ice packs will help.  Swelling and bruising can take several days to resolve.  °6. It is common to experience some constipation if taking pain medication after surgery.  Increasing fluid intake and taking a stool softener (such as Colace) will usually help or prevent this problem from occurring.  A mild laxative (Milk of Magnesia or Miralax) should be taken according to package instructions if there are no bowel movements after 48 hours. °7. Unless discharge instructions indicate otherwise, you may remove your bandages 24-48 hours after surgery, and you may shower at that time.  You may have steri-strips (small skin tapes) in place directly over the incision.  These strips should be left on the skin for 7-10 days.  If your surgeon used skin glue on the incision, you may shower in 24 hours.  The glue will flake off over the next 2-3 weeks.  Any sutures or  staples will be removed at the office during your follow-up visit. °8. ACTIVITIES:  You may resume regular (light) daily activities beginning the next day--such as daily self-care, walking, climbing stairs--gradually increasing activities as tolerated.  You may have sexual intercourse when it is comfortable.  Refrain from any heavy lifting or straining until approved by your doctor. °a. You may drive when you are no longer taking prescription pain medication, you can comfortably wear a seatbelt, and you can safely maneuver your car and apply brakes. °b. RETURN TO WORK:  __________________________________________________________ °9. You should see your doctor in the office for a follow-up appointment approximately 2-3 weeks after your surgery.  Make sure that you call for this appointment within a day or two after you arrive home to insure a convenient appointment time. °10. OTHER INSTRUCTIONS: __________________________________________________________________________________________________________________________ __________________________________________________________________________________________________________________________ °WHEN TO CALL YOUR DOCTOR: °1. Fever over 101.0 °2. Inability to urinate °3. Continued bleeding from incision. °4. Increased pain, redness, or drainage from the incision. °5. Increasing abdominal pain ° °The clinic staff is available to answer your questions during regular business hours.  Please don’t hesitate to call and ask to speak to one of the nurses for clinical concerns.  If you have a medical emergency, go to the nearest emergency room or call 911.  A surgeon from [facility] is always on call at the hospital. °[address], [address], [place], [place]  [address] ? [address] [address], [place], [place]   [address] °[phone] ? [phone] ? FAX [phone] °Web site:   [email or web address] ° ° ° ° ° ° ° °General Anesthesia, Adult, Care After °Refer  to this sheet in the next few weeks. These instructions provide you with information on caring for yourself after your procedure. Your health care provider may also give you more specific instructions. Your treatment has been planned according to current medical practices, but problems sometimes occur. Call your health care provider if you have any problems or questions after your procedure. °WHAT TO EXPECT AFTER THE PROCEDURE °After the procedure, it is typical to experience: °· Sleepiness. °· Nausea and vomiting. °HOME CARE INSTRUCTIONS °· For the first 24 hours after general anesthesia: °¨ Have a responsible person with you. °¨ Do not drive a car. If you are alone, do not take public transportation. °¨ Do not drink alcohol. °¨ Do not take medicine that has not been prescribed by your health care provider. °¨ Do not sign important papers or make important decisions. °¨ You may resume a normal diet and activities as directed by your health care provider. °· Change bandages (dressings) as directed. °· If you have questions or problems that seem related to general anesthesia, call the hospital and ask for the anesthetist or anesthesiologist on call. °SEEK MEDICAL CARE IF: °· You have nausea and vomiting that continue the day after anesthesia. °· You develop a rash. °SEEK IMMEDIATE MEDICAL CARE IF:  °· You have difficulty breathing. °· You have chest pain. °· You have any allergic problems. °  °This information is not intended to replace advice given to you by your health care provider. Make sure you discuss any questions you have with your health care provider. °  °Document Released: 04/15/2000 Document Revised: 01/28/2014 Document Reviewed: 05/08/2011 °Elsevier Interactive Patient Education ©2016 [employer] ° °

## 2015-06-20 NOTE — H&P (View-Only) (Signed)
History of Present Illness Ralene Ok MD; 05/22/2015 11:08 AM) The patient is a 55 year old female who presents for evaluation of gall stones. The patient is a 55 year old female with symptomatic cholelithiasis her last 6 months. Patient states that she had dental work and thereafter after eating creamy soup she had some epigastric abdominal pain which radiated to her back. Patient was seen ER secondary to this abdominal pain she underwent ultrasound. Ultrasound revealed: FINDINGS: Gallbladder:  There multiple stones within the gallbladder. There is no gallbladder wall thickening or pericholecystic fluid. Negative sonographic Murphy's sign.  Common bile duct:  Diameter: 5 mm  Liver:  No focal lesion identified. Within normal limits in parenchymal echogenicity.  IMPRESSION: Cholelithiasis without sonographic evidence of acute cholecystitis. A hepatobiliary scintigraphy may provide better evaluation of the gallbladder if an acute cholecystitis is clinically suspected.   Other Problems Elbert Ewings, CMA; 05/22/2015 10:57 AM) Back Pain Cholelithiasis  Diagnostic Studies History Elbert Ewings, CMA; 05/22/2015 10:57 AM) Colonoscopy 1-5 years ago Pap Smear 1-5 years ago  Allergies Elbert Ewings, CMA; 05/22/2015 10:57 AM) No Known Drug Allergies05/01/2015  Medication History Elbert Ewings, CMA; 05/22/2015 10:57 AM) Betaseron (0.'3MG'$  Kit, Subcutaneous) Active. TraMADol HCl ('50MG'$  Tablet, Oral) Active. Medications Reconciled  Social History Elbert Ewings, [place]; 05/22/2015 10:57 AM) Caffeine use Carbonated beverages, Coffee. No alcohol use No drug use Tobacco use Never smoker.  Family History Elbert Ewings, [place]; 05/22/2015 10:57 AM) Alcohol Abuse Father. Depression Mother.  Pregnancy / Birth History Elbert Ewings, CMA; 05/22/2015 10:57 AM) Age at menarche 74 years. Age of menopause <43 Gravida 84 Maternal age 96-20 Para 4    Review of Systems Elbert Ewings CMA;  05/22/2015 10:57 AM) General Present- Appetite Loss, Fatigue and Night Sweats. Not Present- Chills, Fever, Weight Gain and Weight Loss. Skin Not Present- Change in Wart/Mole, Dryness, Hives, Jaundice, New Lesions, Non-Healing Wounds, Rash and Ulcer. HEENT Not Present- Earache, Hearing Loss, Hoarseness, Nose Bleed, Oral Ulcers, Ringing in the Ears, Seasonal Allergies, Sinus Pain, Sore Throat, Visual Disturbances, Wears glasses/contact lenses and Yellow Eyes. Respiratory Present- Snoring. Not Present- Bloody sputum, Chronic Cough, Difficulty Breathing and Wheezing. Breast Not Present- Breast Mass, Breast Pain, Nipple Discharge and Skin Changes. Cardiovascular Present- Swelling of Extremities. Not Present- Chest Pain, Difficulty Breathing Lying Down, Leg Cramps, Palpitations, Rapid Heart Rate and Shortness of Breath. Gastrointestinal Present- Constipation. Not Present- Abdominal Pain, Bloating, Bloody Stool, Change in Bowel Habits, Chronic diarrhea, Difficulty Swallowing, Excessive gas, Gets full quickly at meals, Hemorrhoids, Indigestion, Nausea, Rectal Pain and Vomiting. Female Genitourinary Not Present- Frequency, Nocturia, Painful Urination, Pelvic Pain and Urgency. Musculoskeletal Present- Back Pain, Muscle Weakness and Swelling of Extremities. Not Present- Joint Pain, Joint Stiffness and Muscle Pain. Neurological Present- Decreased Memory, Trouble walking and Weakness. Not Present- Fainting, Headaches, Numbness, Seizures, Tingling and Tremor. Psychiatric Present- Depression. Not Present- Anxiety, Bipolar, Change in Sleep Pattern, Fearful and Frequent crying. Endocrine Present- Hot flashes. Not Present- Cold Intolerance, Excessive Hunger, Hair Changes, Heat Intolerance and New Diabetes. Hematology Not Present- Easy Bruising, Excessive bleeding, Gland problems, HIV and Persistent Infections.  Vitals Elbert Ewings CMA; 05/22/2015 10:58 AM) 05/22/2015 10:57 AM Weight: 141 lb Height: 60in Body Surface  Area: 1.61 m Body Mass Index: 27.54 kg/m  Temp.: 97.9F  Pulse: 80 (Regular)  BP: 126/92 (Sitting, Left Arm, Standard)       Physical Exam Ralene Ok, MD; 05/22/2015 11:9 AM) General Mental Status-Alert. General Appearance-Consistent with stated age. Hydration-Well hydrated. Voice-Normal.  Head and Neck Head-normocephalic, atraumatic with no lesions or  palpable masses.  Eye Eyeball - Bilateral-Extraocular movements intact. Sclera/Conjunctiva - Bilateral-No scleral icterus.  Chest and Lung Exam Chest and lung exam reveals -quiet, even and easy respiratory effort with no use of accessory muscles. Inspection Chest Wall - Normal. Back - normal.  Cardiovascular Cardiovascular examination reveals -normal heart sounds, regular rate and rhythm with no murmurs.  Abdomen Inspection Normal Exam - No Hernias. Palpation/Percussion Normal exam - Soft, Non Tender, No Rebound tenderness, No Rigidity (guarding) and No hepatosplenomegaly. Auscultation Normal exam - Bowel sounds normal.  Neurologic Neurologic evaluation reveals -alert and oriented x 3 with no impairment of recent or remote memory. Mental Status-Normal.  Musculoskeletal Normal Exam - Left-Upper Extremity Strength Normal and Lower Extremity Strength Normal. Normal Exam - Right-Upper Extremity Strength Normal, Lower Extremity Weakness.    Assessment & Plan Ralene Ok MD; 05/22/2015 11:08 AM) SYMPTOMATIC CHOLELITHIASIS (K80.20) Impression: 55 year old female with symptomatic cholelithiasis  1. We will proceed to the operating room for a laparoscopic cholecystectomy 2. Risks and benefits were discussed with the patient to generally include, but not limited to: infection, bleeding, possible need for post op ERCP, damage to the bile ducts, bile leak, and possible need for further surgery. Alternatives were offered and described. All questions were answered and the patient  voiced understanding of the procedure and wishes to proceed at this point with a laparoscopic cholecystectomy

## 2015-06-20 NOTE — Transfer of Care (Signed)
Immediate Anesthesia Transfer of Care Note  Patient: Kristen Boyle  Procedure(s) Performed: Procedure(s): LAPAROSCOPIC CHOLECYSTECTOMY (N/A)  Patient Location: PACU  Anesthesia Type:General  Level of Consciousness: awake and oriented  Airway & Oxygen Therapy: Patient Spontanous Breathing and Patient connected to face mask oxygen  Post-op Assessment: Report given to RN and Post -op Vital signs reviewed and stable  Post vital signs: Reviewed and stable  Last Vitals:  Filed Vitals:   06/20/15 0904  BP: 124/83  Pulse: 72  Temp: 36.4 C  Resp: 16    Last Pain: There were no vitals filed for this visit.    Patients Stated Pain Goal: 5 (06/20/15 0935)  Complications: No apparent anesthesia complications

## 2015-06-20 NOTE — Anesthesia Preprocedure Evaluation (Signed)
Anesthesia Evaluation  Patient identified by MRN, date of birth, ID band Patient awake    Reviewed: Allergy & Precautions, H&P , NPO status , Patient's Chart, lab work & pertinent test results  History of Anesthesia Complications Negative for: history of anesthetic complications  Airway Mallampati: II  TM Distance: >3 FB Neck ROM: full    Dental no notable dental hx.    Pulmonary neg pulmonary ROS,    Pulmonary exam normal breath sounds clear to auscultation       Cardiovascular negative cardio ROS Normal cardiovascular exam Rhythm:regular Rate:Normal     Neuro/Psych  Headaches, Depression negative neurological ROS     GI/Hepatic negative GI ROS, Neg liver ROS,   Endo/Other  negative endocrine ROS  Renal/GU negative Renal ROS     Musculoskeletal   Abdominal   Peds  Hematology negative hematology ROS (+)   Anesthesia Other Findings Patient has MS  Reproductive/Obstetrics negative OB ROS                             Anesthesia Physical Anesthesia Plan  ASA: II  Anesthesia Plan: General   Post-op Pain Management:    Induction: Intravenous  Airway Management Planned: Oral ETT  Additional Equipment:   Intra-op Plan:   Post-operative Plan:   Informed Consent: I have reviewed the patients History and Physical, chart, labs and discussed the procedure including the risks, benefits and alternatives for the proposed anesthesia with the patient or authorized representative who has indicated his/her understanding and acceptance.   Dental Advisory Given  Plan Discussed with: Anesthesiologist, CRNA and Surgeon  Anesthesia Plan Comments: (Will avoid hyperthermia given MS.)        Anesthesia Quick Evaluation

## 2015-06-20 NOTE — Anesthesia Procedure Notes (Signed)
Procedure Name: Intubation Date/Time: 06/20/2015 11:34 AM Performed by: Early Osmond E Pre-anesthesia Checklist: Patient identified, Emergency Drugs available, Suction available and Patient being monitored Patient Re-evaluated:Patient Re-evaluated prior to inductionOxygen Delivery Method: Circle System Utilized Preoxygenation: Pre-oxygenation with 100% oxygen Intubation Type: IV induction Ventilation: Mask ventilation without difficulty Laryngoscope Size: Miller and 2 Grade View: Grade I Tube type: Oral Tube size: 7.0 mm Number of attempts: 1 Airway Equipment and Method: Stylet Placement Confirmation: ETT inserted through vocal cords under direct vision,  positive ETCO2 and breath sounds checked- equal and bilateral Secured at: 22 cm Tube secured with: Tape Dental Injury: Teeth and Oropharynx as per pre-operative assessment

## 2015-06-20 NOTE — Op Note (Signed)
06/20/2015  12:11 PM  PATIENT:  Kristen Boyle  55 y.o. female  PRE-OPERATIVE DIAGNOSIS:  gallstones  POST-OPERATIVE DIAGNOSIS:  gallstones  PROCEDURE:  Procedure(s): LAPAROSCOPIC CHOLECYSTECTOMY (N/A)  SURGEON:  Surgeon(s) and Role:    * Axel Filler, MD - Primary  ANESTHESIA:   local and general  EBL:   <5cc  BLOOD ADMINISTERED:none  DRAINS: none   LOCAL MEDICATIONS USED:  BUPIVICAINE   SPECIMEN:  Source of Specimen:  gallbladder  DISPOSITION OF SPECIMEN:  PATHOLOGY  COUNTS:  YES  TOURNIQUET:  * No tourniquets in log *  DICTATION: .Dragon Dictation  EBL: <5cc   Complications: none   Counts: reported as correct x 2   Findings:chronic inflammation of the gallbladder and gallstones  Indications for procedure: Pt is a 55 y/o F  with RUQ pain and seen to have gallstones.   Details of the procedure: The patient was taken to the operating and placed in the supine position with bilateral SCDs in place. A time out was called and all facts were verified. A pneumoperitoneum was obtained via A Veress needle technique to a pressure of 14mm of mercury. A 5mm trochar was then placed in the right upper quadrant under visualization, and there were no injuries to any abdominal organs. A 11 mm port was then placed in the umbilical region after infiltrating with local anesthesia under direct visualization. A second epigastric port was placed under direct visualization.   The gallbladder was identified and retracted, the peritoneum was then sharply dissected from the gallbladder and this dissection was carried down to Calot's triangle. The cystic duct was identified and stripped away circumferentially and seen going into the gallbladder 360, the critical angle was obtained.    2 clips were placed proximally one distally and the cystic duct transected. The cystic artery was identified and 2 clips placed proximally and one distally and transected. We then proceeded to remove the  gallbladder off the hepatic fossa with Bovie cautery. A retrieval bag was then placed in the abdomen and gallbladder placed in the bag. The hepatic fossa was then reexamined and hemostasis was achieved with Bovie cautery and was excellent at this portion of the case. The subhepatic fossa and perihepatic fossa was then irrigated until the effluent was clear. The specimen bag and specimen were removed from the abdominal cavity.  The 11 mm trocar fascia was reapproximated with the Endo Close #1 Vicryl x1. The pneumoperitoneum was evacuated and all trochars removed under direct visulalization. The skin was then closed with 4-0 Monocryl and the skin dressed with Steri-Strips, gauze, and tape. The patient was awaken from general anesthesia and taken to the recovery room in stable condition.    PLAN OF CARE: Discharge to home after PACU  PATIENT DISPOSITION:  PACU - hemodynamically stable.   Delay start of Pharmacological VTE agent (>24hrs) due to surgical blood loss or risk of bleeding: not applicable

## 2015-06-30 ENCOUNTER — Encounter: Payer: Self-pay | Admitting: Internal Medicine

## 2015-06-30 ENCOUNTER — Ambulatory Visit (INDEPENDENT_AMBULATORY_CARE_PROVIDER_SITE_OTHER): Payer: Medicare Other | Admitting: Internal Medicine

## 2015-06-30 VITALS — BP 136/88 | HR 61 | Temp 98.7°F | Wt 137.8 lb

## 2015-06-30 DIAGNOSIS — E559 Vitamin D deficiency, unspecified: Secondary | ICD-10-CM

## 2015-06-30 DIAGNOSIS — E785 Hyperlipidemia, unspecified: Secondary | ICD-10-CM

## 2015-06-30 DIAGNOSIS — Z124 Encounter for screening for malignant neoplasm of cervix: Secondary | ICD-10-CM

## 2015-06-30 DIAGNOSIS — Z9049 Acquired absence of other specified parts of digestive tract: Secondary | ICD-10-CM | POA: Diagnosis not present

## 2015-06-30 DIAGNOSIS — Z1231 Encounter for screening mammogram for malignant neoplasm of breast: Secondary | ICD-10-CM

## 2015-06-30 DIAGNOSIS — Z0000 Encounter for general adult medical examination without abnormal findings: Secondary | ICD-10-CM

## 2015-06-30 DIAGNOSIS — F32A Depression, unspecified: Secondary | ICD-10-CM

## 2015-06-30 DIAGNOSIS — R718 Other abnormality of red blood cells: Secondary | ICD-10-CM

## 2015-06-30 DIAGNOSIS — F329 Major depressive disorder, single episode, unspecified: Secondary | ICD-10-CM

## 2015-06-30 MED ORDER — PAROXETINE HCL 20 MG PO TABS: 20.0000 mg | ORAL_TABLET | Freq: Every day | ORAL | Status: DC

## 2015-06-30 NOTE — Progress Notes (Signed)
Patient ID: Kristen Boyle, female   DOB: [birth year], 55 y.o.   MRN: [id number]    Subjective:   Patient ID: Kristen Boyle female   DOB: January 29, 1960 55 y.o.   MRN: [id number]  HPI: [name] is a 55 y.o.  pleasant woman with past medical history of relapsing remitting MS on disease modifying therapy, hyperlipidemia, vitamin D insufficiency, and depression who presents for follow-up of depression and healthcare maintenance.   She has history of depression and I started her on paxil last October in setting of hot flashes which she reported helped both. She reported running out of her medication last June due not having a way to get to the pharmacy. Mail order pharmacy delivery she reports came once to her home but due to insurance issues no longer delivers to her home. She continues to have depressed mood, loss of interest in activities, insomnia, and decreased appetite with no weight change. She would like to restart therapy with paxil.   She had recent elective laparoscopic cholecystectomy on 06/20/15 due to symptomatic cholelithiasis with biopsy results revealing chronic cholecystitis, cholelithiasis, and cholesterolosis. She reports she is doing well with improved pain with tramadol and ibuprofen. She has mild left sided upper abdominal tenderness which she thinks may be due to muscle spasms. She has chronic left sided spasticity from her MS. She has advanced her diet and denies nausea or vomiting due does have loose non-bloody stools. She has follow-up appointment with her surgeon on June 20th.   She has not yet had screening mammogram that was scheduled at last visit. She reports having a colonoscopy in the past 10 years in [place]. She has not had recent pap smear testing.    Past Medical History  Diagnosis Date  . MS (multiple sclerosis) ([place])   . Hyperlipidemia   . Depression   . Abnormality of gait 06/10/2012  . Headache    Current Outpatient Prescriptions    Medication Sig Dispense Refill  . BETASERON 0.3 MG KIT injection Inject 0.'25mg'$  (31m) sub-q every other day 14 kit 11  . Cholecalciferol 1000 units tablet Take 1,000 Units by mouth daily.    .Marland Kitchenibuprofen (ADVIL,MOTRIN) 200 MG tablet Take 400 mg by mouth every 6 (six) hours as needed (For pain.).    .Marland KitchenoxyCODONE-acetaminophen (ROXICET) 5-325 MG tablet Take 1-2 tablets by mouth every 4 (four) hours as needed. 30 tablet 0  . traMADol (ULTRAM) 50 MG tablet Take 1 tablet (50 mg total) by mouth every 12 (twelve) hours as needed. (Patient taking differently: Take 50 mg by mouth every 12 (twelve) hours as needed (For pain.). ) 60 tablet 0   No current facility-administered medications for this visit.   Family History  Problem Relation Age of Onset  . Colon cancer Maternal Aunt   . Diabetes Paternal Aunt   . Dementia Paternal Grandmother     Senile dementia of Alzheimer's type  . Colon cancer Cousin    Social History   Social History  . Marital Status: Single    Spouse Name: N/A  . Number of Children: 4  . Years of Education: 2-College   Occupational History  .      Disabled   Social History Main Topics  . Smoking status: Never Smoker   . Smokeless tobacco: Never Used  . Alcohol Use: No  . Drug Use: No  . Sexual Activity: No   Other Topics Concern  . Not on file   Social History Narrative  Patient lives at home with her daughter.   Disabled.   Education two years of college.   Right handed.   Caffeine one cup of coffee not daily.   Review of Systems: Review of Systems  Constitutional: Positive for malaise/fatigue (chronic). Negative for fever and chills.  Eyes: Positive for blurred vision (chronic).  Respiratory: Negative for cough, shortness of breath and wheezing.   Cardiovascular: Positive for leg swelling (chronic in left LE). Negative for chest pain.  Gastrointestinal: Positive for abdominal pain (LUQ). Negative for nausea, vomiting, diarrhea, constipation and blood in  stool.       Loose stools  Genitourinary: Positive for urgency (chronic incontinence). Negative for dysuria and frequency.  Musculoskeletal: Positive for myalgias (chronic left LE).       Black right 2nd toe nail (all of nail not streak)  Neurological: Positive for sensory change (chronic in left LE) and focal weakness (chronic in left LE). Negative for dizziness and headaches.  Psychiatric/Behavioral: Positive for depression and memory loss. The patient has insomnia.     Objective:  Physical Exam: Filed Vitals:   06/30/15 1623  BP: 136/88  Pulse: 61  Temp: 98.7 F (37.1 C)  TempSrc: Oral  Weight: 137 lb 12.8 oz (62.506 kg)  SpO2: 100%    Physical Exam  Constitutional: She is oriented to person, place, and time. She appears well-developed and well-nourished. No distress.  HENT:  Head: Normocephalic and atraumatic.  Right Ear: External ear normal.  Left Ear: External ear normal.  Nose: Nose normal.  Mouth/Throat: Oropharynx is clear and moist. No oropharyngeal exudate.  Eyes: Conjunctivae and EOM are normal. Pupils are equal, round, and reactive to light. Right eye exhibits no discharge. Left eye exhibits no discharge. No scleral icterus.  No INO  Neck: Normal range of motion. Neck supple.  Cardiovascular: Normal rate, regular rhythm and normal heart sounds.   Pulmonary/Chest: Effort normal and breath sounds normal. No respiratory distress. She has no wheezes. She has no rales.  Abdominal: Soft. Bowel sounds are normal. She exhibits no distension. There is tenderness (mild LUQ). There is no rebound.  Well-healing laparoscopic incision sites   Musculoskeletal: Normal range of motion. She exhibits edema (trace left LE (chronic)). She exhibits no tenderness.  Could not examine right 2nd toe nail for blackness since wearing nail polish  Neurological: She is alert and oriented to person, place, and time. She displays abnormal reflex ( hyperreflexic in b/l LE). No cranial nerve  deficit. She exhibits abnormal muscle tone (left LE spasticity ). Coordination abnormal.  In wheelchair. Left LE 2/5 strength with decreased sensation to light touch of left UE and LE. Right sided strength and sensation normal.   Skin: Skin is warm and dry. No rash noted. She is not diaphoretic. No erythema. No pallor.  Psychiatric: Her behavior is normal. Judgment and thought content normal.  Depressed mood    Assessment & Plan:   Please see problem list for problem-based assessment and plan

## 2015-06-30 NOTE — Patient Instructions (Signed)
-  Will start you back on paxil 20 mg daily to help your mood. I sent a 90 day supply to your pharmacy.  -Will schedule you for the mammogram and call you with a date/time -Will try to get your colonoscopy records and you will need a pap smear next time -It was pleasure being your doctor, you were the very first person I saw and my last patient at this clinic :)  General Instructions:   Please bring your medicines with you each time you come to clinic.  Medicines may include prescription medications, over-the-counter medications, herbal remedies, eye drops, vitamins, or other pills.   Progress Toward Treatment Goals:  No flowsheet data found.  Self Care Goals & Plans:  Self Care Goal 10/08/2013  Manage my medications take my medicines as prescribed; refill my medications on time  Eat healthy foods eat foods that are low in salt; eat baked foods instead of fried foods  Prevent falls use home fall prevention checklist to improve safety; have my vision checked; wear appropriate shoes    No flowsheet data found.   Care Management & Community Referrals:  No flowsheet data found.

## 2015-07-01 NOTE — Assessment & Plan Note (Signed)
Assessment: Pt with recent elective laparoscopic cholecystectomy on 06/20/15 due to symptomatic cholelithiasis with biopsy results revealing chronic cholecystitis, cholelithiasis, and cholesterolosis who presents with well-controlled pain and well-healing incision sites.   Plan:  -Continue to advance diet as tolerated  -Continue tramadol 50 mg BID PRN pain and ibuprofen 400 mg Q 6 hr PRN -Pt to follow-up with general surgery on 07/11/15

## 2015-07-01 NOTE — Assessment & Plan Note (Addendum)
-  Order placed for screening mammogram, pt to have this scheduled  -Will attempt to obtain colonoscopy records, unclear where it was performed in [place] -Needs annual pap smear testing in setting of MS immunosuppressive therapy, perform at next visit  -Obtain screening HIV Ab at next visit -Obtain annual lipid panel at next visit, pt not currently in statin benefit group

## 2015-07-01 NOTE — Assessment & Plan Note (Signed)
Assessment: Pt with history of MS and depression non-compliant with SSRI therapy due to inability to access medication from pharmacy who presents with depressed mood without suicide ideation.  Plan:  -Re-prescribe paroxetine 20 mg daily in setting of post-menopausal symptoms, pt today with family friend who can take her to the pharmacy   -Pt to return in 6-8 weeks for follow-up

## 2015-07-03 NOTE — Progress Notes (Signed)
Internal Medicine Clinic Attending  Case discussed with Dr. Rabbani soon after the resident saw the patient.  We reviewed the resident's history and exam and pertinent patient test results.  I agree with the assessment, diagnosis, and plan of care documented in the resident's note.  

## 2015-07-10 ENCOUNTER — Ambulatory Visit
Admission: RE | Admit: 2015-07-10 | Discharge: 2015-07-10 | Disposition: A | Discharge status: Home / Self Care | Payer: Medicare Other | Source: Ambulatory Visit | Attending: Student in an Organized Health Care Education/Training Program | Admitting: Student in an Organized Health Care Education/Training Program

## 2015-07-10 DIAGNOSIS — Z1231 Encounter for screening mammogram for malignant neoplasm of breast: Secondary | ICD-10-CM

## 2015-07-10 DIAGNOSIS — Z0000 Encounter for general adult medical examination without abnormal findings: Secondary | ICD-10-CM

## 2015-07-18 ENCOUNTER — Encounter: Payer: Self-pay | Admitting: *Deleted

## 2015-08-18 ENCOUNTER — Encounter: Payer: Self-pay | Admitting: Neurology

## 2015-08-18 ENCOUNTER — Ambulatory Visit (INDEPENDENT_AMBULATORY_CARE_PROVIDER_SITE_OTHER): Payer: Medicare Other | Admitting: Neurology

## 2015-08-18 VITALS — BP 108/66 | HR 58 | Ht 60.0 in | Wt 135.5 lb

## 2015-08-18 DIAGNOSIS — R269 Unspecified abnormalities of gait and mobility: Secondary | ICD-10-CM | POA: Diagnosis not present

## 2015-08-18 DIAGNOSIS — G441 Vascular headache, not elsewhere classified: Secondary | ICD-10-CM

## 2015-08-18 DIAGNOSIS — G35 Multiple sclerosis: Secondary | ICD-10-CM | POA: Diagnosis not present

## 2015-08-18 DIAGNOSIS — R519 Headache, unspecified: Secondary | ICD-10-CM | POA: Insufficient documentation

## 2015-08-18 DIAGNOSIS — R51 Headache: Secondary | ICD-10-CM

## 2015-08-18 NOTE — Progress Notes (Signed)
Reason for visit: Multiple sclerosis  Kristen Boyle is an 55 y.o. female  History of present illness:  Kristen Boyle is a 55 year old right-handed black female with a history of multiple sclerosis associated with a gait disorder, and left leg weakness. The patient has not had any falls since last seen. The patient has had surgery around 06/20/2015 for gallbladder resection. The patient has had to modify her diet to prevent diarrhea following the surgery. The patient also has lactose intolerance. The patient has had no new symptoms of numbness, weakness, bowel or bladder control changes since last seen. The patient is tolerating the Betaseron well. She recently went to her ophthalmologist, she was found to have some significant change in vision in the left eye. The patient herself does not recall when this occurred. The patient returns for further evaluation.  Past Medical History:  Diagnosis Date  . Abnormality of gait 06/10/2012  . Depression   . Headache   . Hyperlipidemia   . MS (multiple sclerosis) (HCC)     Past Surgical History:  Procedure Laterality Date  . CHOLECYSTECTOMY N/A 06/20/2015   Procedure: LAPAROSCOPIC CHOLECYSTECTOMY;  Surgeon: Axel Filler, MD;  Location: [facility];  Service: General;  Laterality: N/A;  . DENTAL SURGERY    . TUBAL LIGATION Bilateral     Family History  Problem Relation Age of Onset  . Alcoholism Father   . Diabetes Paternal Aunt   . Dementia Paternal Grandmother     Senile dementia of Alzheimer's type  . Colon cancer Cousin   . Colon cancer Maternal Aunt     Social history:  reports that she has never smoked. She has never used smokeless tobacco. She reports that she does not drink alcohol or use drugs.   No Known Allergies  Medications:  Prior to Admission medications   Medication Sig Start Date End Date Taking? Authorizing Provider  BETASERON 0.3 MG KIT injection Inject 0.25mg  (41ml) sub-q every other day 08/24/14  Yes Melvyn Novas, MD  Cholecalciferol 1000 units tablet Take 1,000 Units by mouth daily.   Yes Historical Provider, MD  ibuprofen (ADVIL,MOTRIN) 200 MG tablet Take 400 mg by mouth every 6 (six) hours as needed (For pain.).   Yes Historical Provider, MD  PARoxetine (PAXIL) 20 MG tablet Take 1 tablet (20 mg total) by mouth daily. 06/30/15 06/29/16 Yes Marjan Rabbani, MD    ROS:  Out of a complete 14 system review of symptoms, the patient complains only of the following symptoms, and all other reviewed systems are negative.  Excessive sweating Loss of vision, eye pain Leg swelling Frequent waking, daytime sleepiness Joint swelling, walking difficulty Numbness, weakness Depression  Blood pressure 108/66, pulse (!) 58, height 5' (1.524 m), weight 135 lb 8 oz (61.5 kg).  Physical Exam  General: The patient is alert and cooperative at the time of the examination.  Skin: No significant peripheral edema is noted.   Neurologic Exam  Mental status: The patient is alert and oriented x 3 at the time of the examination. The patient has apparent normal recent and remote memory, with an apparently normal attention span and concentration ability.   Cranial nerves: Facial symmetry is present. Speech is normal, no aphasia or dysarthria is noted. Extraocular movements are full. Visual fields are full. Pupils are equal, round, and reactive to light. Discs are flat bilaterally.  Motor: The patient has good strength in all 4 extremities, with exception of diffuse 4/5 strength of the left leg. The  patient wears a brace on the left ankle and foot.  Sensory examination: Soft touch sensation is symmetric on the face, arms, and legs. Pinprick sensation is symmetric throughout.  Coordination: The patient has good finger-nose-finger and heel-to-shin bilaterally.  Gait and station: The patient has a circumduction type gait with the left leg, the patient uses a cane for ambulation. Tandem gait was not attempted. Romberg  is negative. No drift is seen.  Reflexes: Deep tendon reflexes are symmetric.   MRI brain 03/24/15:  IMPRESSION:  Abnormal MRI scan of the brain showing multiple discrete and confluent periventricular, subcortical, brainstem and cerebellar and corpus callosal white matter hyperintensities consistent with chronic demyelinating disease. No enhancing lesions are noted. Overall no significant change compared with MRI scan dated 04/21/2014.  * MRI scan images were reviewed online. I agree with the written report.    Assessment/Plan:  1. Multiple sclerosis  2. Gait disorder  3. Visual change, left eye  The patient could have sustained an optic neuritis at some point on the left, the patient be set up for a visual evoked response test. She will continue on the Betaseron. In the future, we may consider switching the patient to another medication such as Gilenya or Tecfidera. Blood work has been done recently prior to her gallbladder surgery.  Jill Alexanders MD 08/18/2015 11:24 AM  [facility] [address] [place], [place] [address]  Phone [phone] Fax [phone]

## 2015-08-18 NOTE — Patient Instructions (Addendum)
Fall Prevention in the Home  Falls can cause injuries and can affect people from all age groups. There are many simple things that you can do to make your home safe and to help prevent falls. WHAT CAN I DO ON THE OUTSIDE OF MY HOME?  Regularly repair the edges of walkways and driveways and fix any cracks.  Remove high doorway thresholds.  Trim any shrubbery on the main path into your home.  Use bright outdoor lighting.  Clear walkways of debris and clutter, including tools and rocks.  Regularly check that handrails are securely fastened and in good repair. Both sides of any steps should have handrails.  Install guardrails along the edges of any raised decks or porches.  Have leaves, snow, and ice cleared regularly.  Use sand or salt on walkways during winter months.  In the garage, clean up any spills right away, including grease or oil spills. WHAT CAN I DO IN THE BATHROOM?  Use night lights.  Install grab bars by the toilet and in the tub and shower. Do not use towel bars as grab bars.  Use non-skid mats or decals on the floor of the tub or shower.  If you need to sit down while you are in the shower, use a plastic, non-slip stool..  Keep the floor dry. Immediately clean up any water that spills on the floor.  Remove soap buildup in the tub or shower on a regular basis.  Attach bath mats securely with double-sided non-slip rug tape.  Remove throw rugs and other tripping hazards from the floor. WHAT CAN I DO IN THE BEDROOM?  Use night lights.  Make sure that a bedside light is easy to reach.  Do not use oversized bedding that drapes onto the floor.  Have a firm chair that has side arms to use for getting dressed.  Remove throw rugs and other tripping hazards from the floor. WHAT CAN I DO IN THE KITCHEN?   Clean up any spills right away.  Avoid walking on wet floors.  Place frequently used items in easy-to-reach places.  If you need to reach for something  above you, use a sturdy step stool that has a grab bar.  Keep electrical cables out of the way.  Do not use floor polish or wax that makes floors slippery. If you have to use wax, make sure that it is non-skid floor wax.  Remove throw rugs and other tripping hazards from the floor. WHAT CAN I DO IN THE STAIRWAYS?  Do not leave any items on the stairs.  Make sure that there are handrails on both sides of the stairs. Fix handrails that are broken or loose. Make sure that handrails are as long as the stairways.  Check any carpeting to make sure that it is firmly attached to the stairs. Fix any carpet that is loose or worn.  Avoid having throw rugs at the top or bottom of stairways, or secure the rugs with carpet tape to prevent them from moving.  Make sure that you have a light switch at the top of the stairs and the bottom of the stairs. If you do not have them, have them installed. WHAT ARE SOME OTHER FALL PREVENTION TIPS?  Wear closed-toe shoes that fit well and support your feet. Wear shoes that have rubber soles or low heels.  When you use a stepladder, make sure that it is completely opened and that the sides are firmly locked. Have someone hold the ladder while you   are using it. Do not climb a closed stepladder.  Add color or contrast paint or tape to grab bars and handrails in your home. Place contrasting color strips on the first and last steps.  Use mobility aids as needed, such as canes, walkers, scooters, and crutches.  Turn on lights if it is dark. Replace any light bulbs that burn out.  Set up furniture so that there are clear paths. Keep the furniture in the same spot.  Fix any uneven floor surfaces.  Choose a carpet design that does not hide the edge of steps of a stairway.  Be aware of any and all pets.  Review your medicines with your healthcare provider. Some medicines can cause dizziness or changes in blood pressure, which increase your risk of falling. Talk  with your health care provider about other ways that you can decrease your risk of falls. This may include working with a physical therapist or trainer to improve your strength, balance, and endurance.   This information is not intended to replace advice given to you by your health care provider. Make sure you discuss any questions you have with your health care provider.   Document Released: 12/28/2001 Document Revised: 05/24/2014 Document Reviewed: 02/11/2014 Elsevier Interactive Patient Education 2016 [employer]  

## 2015-08-23 ENCOUNTER — Other Ambulatory Visit: Payer: Self-pay

## 2015-08-23 ENCOUNTER — Other Ambulatory Visit: Payer: Self-pay | Admitting: Neurology

## 2015-08-23 MED ORDER — INTERFERON BETA-1B 0.3 MG ~~LOC~~ KIT: PACK | SUBCUTANEOUS | 11 refills | Status: DC

## 2015-08-30 ENCOUNTER — Other Ambulatory Visit: Payer: Self-pay

## 2015-09-13 ENCOUNTER — Ambulatory Visit (INDEPENDENT_AMBULATORY_CARE_PROVIDER_SITE_OTHER): Payer: Medicare Other

## 2015-09-13 ENCOUNTER — Telehealth: Payer: Self-pay | Admitting: Neurology

## 2015-09-13 DIAGNOSIS — G35 Multiple sclerosis: Secondary | ICD-10-CM | POA: Diagnosis not present

## 2015-09-13 NOTE — Telephone Encounter (Signed)
I called patient. The visual evoked response test did not show any response on either side. The visual acuity is 20/40 bilaterally. The patient herself has not noted any change in vision over the last 4-5 years, her eye doctor felt that there was a change in vision in the left eye. The patient likely has bilateral optic nerve involvement. It is not clear that there is any new issue going on.

## 2015-09-13 NOTE — Procedures (Signed)
     History: Kristen LatinaJacqueline Boyle is a 55 year old patient with a history of multiple sclerosis. The patient has been noted by her ophthalmologist to have a visual change in the left eye. The patient is being evaluated for this issue.  Description: Visual evoked response test was performed using 32 x 32, 16 x 16, 8 x 8, and 4 x 4 check sizes. With all of these modalities, no response was seen on either side. Visual acuity bilaterally was 20/40 uncorrected.  Impression: This is an abnormal visual response test secondary to lack of response on both sides. This study is consistent with lesions either demyelinating or ischemic involving the anterior optic pathways with the optic nerves bilaterally or with a single lesion in the optic nerve chiasm. Poor cooperation on the part of the patient may also produce similar results. Clinical correlation is required.

## 2015-10-31 ENCOUNTER — Telehealth: Payer: Self-pay | Admitting: Internal Medicine

## 2015-10-31 NOTE — Telephone Encounter (Signed)
APT. REMINDER CALL, NO ANSWER, NO VOICEMAIL °

## 2015-11-01 ENCOUNTER — Ambulatory Visit (HOSPITAL_COMMUNITY): Payer: Medicare Other

## 2015-11-01 ENCOUNTER — Ambulatory Visit (INDEPENDENT_AMBULATORY_CARE_PROVIDER_SITE_OTHER): Payer: Medicare Other | Admitting: Internal Medicine

## 2015-11-01 VITALS — BP 130/85 | HR 64 | Temp 98.2°F | Ht 60.0 in | Wt 140.6 lb

## 2015-11-01 DIAGNOSIS — R7401 Elevation of levels of liver transaminase levels: Secondary | ICD-10-CM

## 2015-11-01 DIAGNOSIS — R740 Nonspecific elevation of levels of transaminase and lactic acid dehydrogenase [LDH]: Secondary | ICD-10-CM

## 2015-11-01 DIAGNOSIS — R600 Localized edema: Secondary | ICD-10-CM | POA: Diagnosis not present

## 2015-11-01 LAB — POCT URINALYSIS DIPSTICK
BILIRUBIN UA: NEGATIVE
GLUCOSE UA: NEGATIVE
Ketones, UA: NEGATIVE
NITRITE UA: NEGATIVE
PROTEIN UA: NEGATIVE
Spec Grav, UA: 1.015
UROBILINOGEN UA: 0.2
pH, UA: 5

## 2015-11-01 NOTE — Patient Instructions (Addendum)
Ms. Rostron it was nice meeting you today.  -I have ordered some labs and will call you when the results come back  -I have also ordered an ultrasound of your legs. Our office will schedule it for you. Please go for the study as soon as possible.   -Please return to the clinic in 1-2 weeks

## 2015-11-02 ENCOUNTER — Ambulatory Visit (HOSPITAL_COMMUNITY)
Admission: RE | Admit: 2015-11-02 | Discharge: 2015-11-02 | Disposition: A | Discharge status: Home / Self Care | Payer: Medicare Other | Source: Ambulatory Visit | Attending: Oncology | Admitting: Oncology

## 2015-11-02 DIAGNOSIS — R600 Localized edema: Secondary | ICD-10-CM | POA: Diagnosis not present

## 2015-11-02 DIAGNOSIS — R7401 Elevation of levels of liver transaminase levels: Secondary | ICD-10-CM | POA: Insufficient documentation

## 2015-11-02 DIAGNOSIS — R740 Nonspecific elevation of levels of transaminase and lactic acid dehydrogenase [LDH]: Secondary | ICD-10-CM

## 2015-11-02 LAB — CMP14 + ANION GAP
A/G RATIO: 1.4 (ref 1.2–2.2)
ALK PHOS: 125 IU/L — AB (ref 39–117)
ALT: 66 IU/L — ABNORMAL HIGH (ref 0–32)
AST: 35 IU/L (ref 0–40)
Albumin: 4.6 g/dL (ref 3.5–5.5)
Anion Gap: 18 mmol/L (ref 10.0–18.0)
BILIRUBIN TOTAL: 0.2 mg/dL (ref 0.0–1.2)
BUN/Creatinine Ratio: 16 (ref 9–23)
BUN: 16 mg/dL (ref 6–24)
CHLORIDE: 103 mmol/L (ref 96–106)
CO2: 24 mmol/L (ref 18–29)
Calcium: 10.1 mg/dL (ref 8.7–10.2)
Creatinine, Ser: 1 mg/dL (ref 0.57–1.00)
GFR calc non Af Amer: 63 mL/min/{1.73_m2} (ref >59)
GFR, EST AFRICAN AMERICAN: 72 mL/min/{1.73_m2} (ref >59)
GLUCOSE: 84 mg/dL (ref 65–99)
Globulin, Total: 3.3 g/dL (ref 1.5–4.5)
POTASSIUM: 4.4 mmol/L (ref 3.5–5.2)
Sodium: 145 mmol/L — ABNORMAL HIGH (ref 134–144)
TOTAL PROTEIN: 7.9 g/dL (ref 6.0–8.5)

## 2015-11-02 LAB — TSH: TSH: 2.41 u[IU]/mL (ref 0.450–4.500)

## 2015-11-02 NOTE — Progress Notes (Signed)
VASCULAR LAB PRELIMINARY  PRELIMINARY  PRELIMINARY  PRELIMINARY  Bilateral lower extremity venous duplex completed.    Preliminary report:  There is no DVT or SVT noted in the bilateral lower extremities.   Called results to Dr. Benna Dunks, [facility], [facility]T 11/02/2015, 11:45 AM

## 2015-11-02 NOTE — Assessment & Plan Note (Addendum)
History of present illness Patient is presenting with a one-month history of bilateral lower extremity edema (left leg more than right). States edema has been getting progressively worse. As per patient, edema is worse later in the day. Reports having one episode of pain in her left leg a few days ago. Denies any recent travel, surgery/immobility. Denies any personal or family history of blood clots. Denies any history of heart failure. Denies having any shortness of breath. States elevating her legs and does not help with the edema.  Assessment Patient does not have a recorded history of heart failure and lungs clear on exam. Labs ordered at this visit showing creatinine 1.0. (baseline 0.9-1.0).  Nephrotic syndrome less likely as urine dipstick negative for proteinuria and liver chemistries showing normal albumin. Monoclonal gammopathy less likely as total protein, albumin, and globulin normal. Hypothyroidism less likely as TSH is normal. Lower extremity edema could be dependent in nature, however, since left the leg lower extremity is more swollen, there is concern for possible lymphedema from lymphatic obstruction. In addition, DVT is also on the differential.  Plan -Dopplers of bilateral lower extremities are pending -If Dopplers come back normal, consider ordering CT scan/ US of pelvis to rule out possible pelvic adenopathy/ obstruction.  Addendum 11/02/15: Bilateral lower extremity dopplers negative for DVT. Tried calling the patient to discuss the results but could not reach her over the phone. Left a voicemail asking the patient to call the clinic back.

## 2015-11-02 NOTE — Assessment & Plan Note (Signed)
Assessment Liver chemistries showing mildly elevated ALT (66) and mildly elevated alkaline phosphatase (125). AST is normal. Patient is currently asymptomatic.  Plan -Consider repeating LFTs in 3 months. If elevated, consider ordering an ultrasound of the liver.

## 2015-11-02 NOTE — Progress Notes (Signed)
Medicine attending: Medical history, presenting problems, physical findings, and medications, reviewed with resident physician Dr Reggie Pile on the day of the patient visit and I concur with her evaluation and management plan. We discussed differential for assymmetric lower extremity edema & we will pursue appropriate testing.

## 2015-11-02 NOTE — Progress Notes (Signed)
   CC: Patient is complaining of swelling in her legs.  HPI:  Kristen Boyle is a 55 y.o. F with a PMHx of conditions listed below presenting to the clinic complaining of swelling in her legs. Please see problem based charting for the status of the patient's current and chronic medical conditions.   Past Medical History:  Diagnosis Date  . Abnormality of gait 06/10/2012  . Depression   . Headache   . Hyperlipidemia   . MS (multiple sclerosis) (HCC)     Review of Systems:   Review of Systems  Constitutional: Negative for chills and fever.  Respiratory: Negative for cough and shortness of breath.   Cardiovascular: Positive for leg swelling. Negative for chest pain.  Gastrointestinal: Negative for abdominal pain, nausea and vomiting.    Physical Exam:  Vitals:   11/01/15 0945  BP: 130/85  Pulse: 64  Temp: 98.2 F (36.8 C)  TempSrc: Oral  SpO2: 100%  Weight: 140 lb 9.6 oz (63.8 kg)  Height: 5' (1.524 m)   Physical Exam  Constitutional: She is oriented to person, place, and time. She appears well-developed and well-nourished. No distress.  HENT:  Head: Normocephalic and atraumatic.  Mouth/Throat: Oropharynx is clear and moist.  Eyes: EOM are normal. Right eye exhibits no discharge. Left eye exhibits no discharge.  Neck: Neck supple. No tracheal deviation present.  Cardiovascular: Normal rate, regular rhythm and intact distal pulses.   Nonpitting edema bilateral lower extremities (L>R).  Pulmonary/Chest: Effort normal and breath sounds normal. No respiratory distress.  Abdominal: Soft. Bowel sounds are normal. She exhibits no distension. There is no tenderness. There is no guarding.  Musculoskeletal: Normal range of motion. She exhibits no deformity.  Neurological: She is alert and oriented to person, place, and time.  Skin: Skin is warm and dry.    Assessment & Plan:   See Encounters Tab for problem based charting.  Patient discussed with Dr. Cyndie Chime

## 2015-11-09 ENCOUNTER — Ambulatory Visit (INDEPENDENT_AMBULATORY_CARE_PROVIDER_SITE_OTHER): Payer: Medicare Other | Admitting: Internal Medicine

## 2015-11-09 VITALS — BP 121/75 | HR 61 | Temp 98.1°F | Wt 143.1 lb

## 2015-11-09 DIAGNOSIS — R600 Localized edema: Secondary | ICD-10-CM | POA: Diagnosis not present

## 2015-11-09 DIAGNOSIS — G35 Multiple sclerosis: Secondary | ICD-10-CM | POA: Diagnosis not present

## 2015-11-09 NOTE — Progress Notes (Signed)
   CC: Patient is here for follow-up of her lower extremity edema.  HPI:  [name] is a 55 y.o. female with a past medical history of conditions listed below presenting to the clinic for follow-up of her lower extremity edema. Please see problem based charting for the status of the patient's current and chronic medical conditions.   Past Medical History:  Diagnosis Date  . Abnormality of gait 06/10/2012  . Depression   . Headache   . Hyperlipidemia   . MS (multiple sclerosis) (HCC)     Review of Systems:  Pertinent positives mentioned in HPI. Remainder of all ROS negative.   Physical Exam:  Vitals:   11/09/15 1434  BP: 121/75  Pulse: 61  Temp: 98.1 F (36.7 C)  TempSrc: Oral  SpO2: 100%  Weight: 143 lb 1.6 oz (64.9 kg)   Physical Exam  Constitutional: She is oriented to person, place, and time. She appears well-developed and well-nourished. No distress.  HENT:  Head: Normocephalic and atraumatic.  Mouth/Throat: Oropharynx is clear and moist.  Eyes: EOM are normal. Right eye exhibits no discharge. Left eye exhibits no discharge.  Neck: Neck supple. No tracheal deviation present.  No JVD  Cardiovascular: Normal rate, regular rhythm and intact distal pulses.   Pulmonary/Chest: Effort normal and breath sounds normal. No respiratory distress. She has no wheezes. She has no rales.  Abdominal: Soft. Bowel sounds are normal. She exhibits no distension. There is no tenderness.  Musculoskeletal:  Trace nonpitting of edema bilateral lower extremities (left greater than right). Decreased strength and foot drop noted in the left lower extremity.  Neurological: She is alert and oriented to person, place, and time.  Patellar reflex: 2+ on the right and 3+ on the left  Skin: Skin is warm and dry.    Assessment & Plan:   See Encounters Tab for problem based charting.  Patient seen with Dr. Cyndie Chime

## 2015-11-09 NOTE — Progress Notes (Signed)
Medicine attending: I personally interviewed and thoroughly examined this patient on the day of the patient visit and reviewed pertinent clinical ,laboratory, and radiographic data  with resident physician Dr. Reggie Pile and we discussed a management plan. Lady with multiple sclerosis. LLE weakness. C/O increased swelling of lower extremities. Calves are wide but supple and not edematous. We saw her last week and results of testing show no proteinuria, no monoclonal gammopathy, no signs or sxs of cardiomyopathy. She admits to increased salt in her diet. We will look up beta-seron to see if it causes fliud retention. We reassured her and her daughter that we have not found any major pathology.

## 2015-11-09 NOTE — Assessment & Plan Note (Signed)
History of present illness Patient continues to complain of bilateral lower extremity edema (left greater than right) at this visit. Denies having any shortness of breath or abdominal distention. States she does have chronic left lower extremity weakness secondary to history of multiple sclerosis and sits most of the time. States she has recently started exercising at the gym.  Assessment Workup during previous visit was normal. Her creatinine was 1.0; baseline is 0.9-1.0. Urine dipstick was negative for proteinuria. Albumin was normal. Total protein and globulin normal. TSH normal. At present, she has very minimal nonpitting edema of bilateral lower extremities. Edema likely dependent in nature as patient does not ambulate much. Very little suspicion for lymphedema/amphetamine obstruction.  Plan -Encouraged patient to keep exercising -Elevate legs when sitting

## 2016-02-19 ENCOUNTER — Encounter: Payer: Self-pay | Admitting: Adult Health

## 2016-02-19 ENCOUNTER — Ambulatory Visit (INDEPENDENT_AMBULATORY_CARE_PROVIDER_SITE_OTHER): Payer: Medicare Other | Admitting: Adult Health

## 2016-02-19 VITALS — BP 120/78 | HR 72 | Ht 60.0 in | Wt 138.4 lb

## 2016-02-19 DIAGNOSIS — Z5181 Encounter for therapeutic drug level monitoring: Secondary | ICD-10-CM | POA: Diagnosis not present

## 2016-02-19 DIAGNOSIS — G35 Multiple sclerosis: Secondary | ICD-10-CM

## 2016-02-19 DIAGNOSIS — R269 Unspecified abnormalities of gait and mobility: Secondary | ICD-10-CM

## 2016-02-19 NOTE — Progress Notes (Signed)
PATIENT: Kristen Boyle DOB: November 30, 1960  REASON FOR VISIT: follow up- multiple sclerosis, gait disorder HISTORY FROM: patient  HISTORY OF PRESENT ILLNESS: Kristen Boyle is a 56 year old female with a history of multiple sclerosis associated with gait disorder. She returns today for an evaluation. The patient states that she continues to have trouble with her vision in the left eye. She states that it is blurry most of the time. Her daughter is with her today. And reports that she has noticed that her mom sometimes has trouble with the context of conversations. She denies any new numbness or weakness. She continues to have the symptoms in the lower extremities. Denies any changes with the bowels or bladder. She does report some mild constipation. She recently had a fall. She reports that she lost her balance hitting her knee and hip. This happened January 23. She does use a cane when ambulating. Her last MRI of the brain was approximately one year ago. She returns today for an evaluation.  HISTORY 08/18/15:[name] is a 56 year old right-handed black female with a history of multiple sclerosis associated with a gait disorder, and left leg weakness. The patient has not had any falls since last seen. The patient has had surgery around 06/20/2015 for gallbladder resection. The patient has had to modify her diet to prevent diarrhea following the surgery. The patient also has lactose intolerance. The patient has had no new symptoms of numbness, weakness, bowel or bladder control changes since last seen. The patient is tolerating the Betaseron well. She recently went to her ophthalmologist, she was found to have some significant change in vision in the left eye. The patient herself does not recall when this occurred. The patient returns for further evaluation.   REVIEW OF SYSTEMS: Out of a complete 14 system review of symptoms, the patient complains only of the following symptoms, and all other  reviewed systems are negative.  See history of present illness  ALLERGIES: No Known Allergies  HOME MEDICATIONS: Outpatient Medications Prior to Visit  Medication Sig Dispense Refill  . Cholecalciferol 1000 units tablet Take 1,000 Units by mouth daily.    Marland Kitchen ibuprofen (ADVIL,MOTRIN) 200 MG tablet Take 400 mg by mouth every 6 (six) hours as needed (For pain.).    . Interferon Beta-1b (BETASERON) 0.3 MG KIT injection Inject 0.'25mg'$  (71m) sub-q every other day 14 kit 11  . PARoxetine (PAXIL) 20 MG tablet Take 1 tablet (20 mg total) by mouth daily. 90 tablet 1   No facility-administered medications prior to visit.     PAST MEDICAL HISTORY: Past Medical History:  Diagnosis Date  . Abnormality of gait 06/10/2012  . Depression   . Headache   . Hyperlipidemia   . MS (multiple sclerosis) ([place]     PAST SURGICAL HISTORY: Past Surgical History:  Procedure Laterality Date  . CHOLECYSTECTOMY N/A 06/20/2015   Procedure: LAPAROSCOPIC CHOLECYSTECTOMY;  Surgeon: ARalene Ok MD;  Location: [facility];  Service: General;  Laterality: N/A;  . DENTAL SURGERY    . TUBAL LIGATION Bilateral     FAMILY HISTORY: Family History  Problem Relation Age of Onset  . Alcoholism Father   . Diabetes Paternal Aunt   . Dementia Paternal Grandmother     Senile dementia of Alzheimer's type  . Colon cancer Cousin   . Colon cancer Maternal Aunt     SOCIAL HISTORY: Social History   Social History  . Marital status: Single    Spouse name: N/A  . Number of children:  4  . Years of education: 2-College   Occupational History  .      Disabled   Social History Main Topics  . Smoking status: Never Smoker  . Smokeless tobacco: Never Used  . Alcohol use No  . Drug use: No  . Sexual activity: No   Other Topics Concern  . Not on file   Social History Narrative   Patient lives at home with her daughter.   Disabled.   Education two years of college.   Right handed.   Caffeine one cup of coffee not  daily.      PHYSICAL EXAM  Vitals:   02/19/16 1125  BP: 120/78  Pulse: 72  Weight: 138 lb 6.4 oz (62.8 kg)  Height: 5' (1.524 m)   Body mass index is 27.03 kg/m.  Generalized: Well developed, in no acute distress   Neurological examination  Mentation: Alert oriented to time, place, history taking. Follows all commands speech and language fluent Cranial nerve II-XII: Pupils were equal round reactive to light. Extraocular movements were full, visual field were full on confrontational test. Facial sensation and strength were normal. Uvula tongue midline. Head turning and shoulder shrug  were normal and symmetric. Motor: The patient has good strength in the upper extremity. Some weakness with her grip strength on the left. 3 out of 5 strength in the left lower extremity. Left foot drop. She has worn AFO brace in the past. Sensory: Sensory testing is intact to soft touch on all 4 extremities. No evidence of extinction is noted.  Coordination: Cerebellar testing reveals good finger-nose-finger and heel-to-shin bilaterally.  Gait and station: Circumduction type gait on the left. Patient is using a cane. Unable to tandem gait. Romberg is negative. Reflexes: Deep tendon reflexes are symmetric and normal bilaterally.   DIAGNOSTIC DATA (LABS, IMAGING, TESTING) - I reviewed patient records, labs, notes, testing and imaging myself where available.  Lab Results  Component Value Date   WBC 4.8 06/15/2015   HGB 12.3 06/15/2015   HCT 38.7 06/15/2015   MCV 76.8 (L) 06/15/2015   PLT 290 06/15/2015      Component Value Date/Time   NA 145 (H) 11/01/2015 1055   K 4.4 11/01/2015 1055   CL 103 11/01/2015 1055   CO2 24 11/01/2015 1055   GLUCOSE 84 11/01/2015 1055   GLUCOSE 93 04/16/2015 2254   BUN 16 11/01/2015 1055   CREATININE 1.00 11/01/2015 1055   CREATININE 0.89 09/09/2013 1115   CALCIUM 10.1 11/01/2015 1055   PROT 7.9 11/01/2015 1055   ALBUMIN 4.6 11/01/2015 1055   AST 35 11/01/2015  1055   ALT 66 (H) 11/01/2015 1055   ALKPHOS 125 (H) 11/01/2015 1055   BILITOT 0.2 11/01/2015 1055   GFRNONAA 63 11/01/2015 1055   GFRNONAA 73 09/09/2013 1115   GFRAA 72 11/01/2015 1055   GFRAA 84 09/09/2013 1115        ASSESSMENT AND PLAN 56 y.o. year old female  has a past medical history of Abnormality of gait (06/10/2012); Depression; Headache; Hyperlipidemia; and MS (multiple sclerosis) ([place]). here with:  1. Multiple sclerosis 2. Gait disturbance  For now the patient will remain on Betaseron. We will repeat blood work today. I will also repeat MRI of the brain to look for any acute changes and progression of multiple sclerosis. Pending the results of the MRI of the brain with may consider switching the patient to another medication such as Gilenya or tecfidera. Patient verbalized understanding. She will follow-up in 6  months with Dr. Jannifer Franklin.     Ward Givens, MSN, NP-C 02/19/2016, 11:38 AM [facility] [address] [place], [place] [address] [phone]

## 2016-02-19 NOTE — Patient Instructions (Signed)
Continue Betaseron Blood work today MRI brain If your symptoms worsen or you develop new symptoms please let us know.   

## 2016-02-19 NOTE — Progress Notes (Signed)
I have read the note, and I agree with the clinical assessment and plan.  WILLIS,CHARLES KEITH   

## 2016-02-20 LAB — COMPREHENSIVE METABOLIC PANEL
A/G RATIO: 1.2 (ref 1.2–2.2)
ALT: 14 IU/L (ref 0–32)
AST: 19 IU/L (ref 0–40)
Albumin: 4.5 g/dL (ref 3.5–5.5)
Alkaline Phosphatase: 86 IU/L (ref 39–117)
BILIRUBIN TOTAL: 0.3 mg/dL (ref 0.0–1.2)
BUN / CREAT RATIO: 19 (ref 9–23)
BUN: 18 mg/dL (ref 6–24)
CHLORIDE: 102 mmol/L (ref 96–106)
CO2: 27 mmol/L (ref 18–29)
Calcium: 9.9 mg/dL (ref 8.7–10.2)
Creatinine, Ser: 0.94 mg/dL (ref 0.57–1.00)
GFR calc Af Amer: 78 mL/min/{1.73_m2} (ref >59)
GFR calc non Af Amer: 68 mL/min/{1.73_m2} (ref >59)
GLOBULIN, TOTAL: 3.7 g/dL (ref 1.5–4.5)
Glucose: 97 mg/dL (ref 65–99)
Potassium: 4.6 mmol/L (ref 3.5–5.2)
Sodium: 144 mmol/L (ref 134–144)
TOTAL PROTEIN: 8.2 g/dL (ref 6.0–8.5)

## 2016-02-20 LAB — CBC WITH DIFFERENTIAL/PLATELET
BASOS ABS: 0 10*3/uL (ref 0.0–0.2)
Basos: 1 %
EOS (ABSOLUTE): 0.2 10*3/uL (ref 0.0–0.4)
Eos: 5 %
HEMATOCRIT: 39.9 % (ref 34.0–46.6)
HEMOGLOBIN: 12.6 g/dL (ref 11.1–15.9)
Immature Grans (Abs): 0 10*3/uL (ref 0.0–0.1)
Immature Granulocytes: 0 %
LYMPHS ABS: 2.7 10*3/uL (ref 0.7–3.1)
Lymphs: 59 %
MCH: 24 pg — AB (ref 26.6–33.0)
MCHC: 31.6 g/dL (ref 31.5–35.7)
MCV: 76 fL — ABNORMAL LOW (ref 79–97)
MONOCYTES: 8 %
Monocytes Absolute: 0.3 10*3/uL (ref 0.1–0.9)
Neutrophils Absolute: 1.2 10*3/uL — ABNORMAL LOW (ref 1.4–7.0)
Neutrophils: 27 %
Platelets: 305 10*3/uL (ref 150–379)
RBC: 5.24 x10E6/uL (ref 3.77–5.28)
RDW: 15.2 % (ref 12.3–15.4)
WBC: 4.5 10*3/uL (ref 3.4–10.8)

## 2016-04-11 ENCOUNTER — Telehealth: Payer: Self-pay | Admitting: Internal Medicine

## 2016-04-11 NOTE — Telephone Encounter (Signed)
APT. REMINDER CALL, LMTCB °

## 2016-04-12 ENCOUNTER — Other Ambulatory Visit (HOSPITAL_COMMUNITY)
Admission: RE | Admit: 2016-04-12 | Discharge: 2016-04-12 | Disposition: A | Discharge status: Home / Self Care | Payer: Medicare Other | Source: Ambulatory Visit | Attending: Oncology | Admitting: Oncology

## 2016-04-12 ENCOUNTER — Ambulatory Visit (INDEPENDENT_AMBULATORY_CARE_PROVIDER_SITE_OTHER): Payer: Medicare Other | Admitting: Internal Medicine

## 2016-04-12 VITALS — BP 118/65 | HR 69 | Temp 97.7°F | Ht 60.0 in | Wt 141.8 lb

## 2016-04-12 DIAGNOSIS — Z79899 Other long term (current) drug therapy: Secondary | ICD-10-CM

## 2016-04-12 DIAGNOSIS — Z5181 Encounter for therapeutic drug level monitoring: Secondary | ICD-10-CM | POA: Insufficient documentation

## 2016-04-12 DIAGNOSIS — Z114 Encounter for screening for human immunodeficiency virus [HIV]: Secondary | ICD-10-CM | POA: Insufficient documentation

## 2016-04-12 DIAGNOSIS — Z01419 Encounter for gynecological examination (general) (routine) without abnormal findings: Secondary | ICD-10-CM | POA: Insufficient documentation

## 2016-04-12 DIAGNOSIS — E785 Hyperlipidemia, unspecified: Secondary | ICD-10-CM | POA: Diagnosis not present

## 2016-04-12 DIAGNOSIS — Z01411 Encounter for gynecological examination (general) (routine) with abnormal findings: Secondary | ICD-10-CM

## 2016-04-12 DIAGNOSIS — G35 Multiple sclerosis: Secondary | ICD-10-CM

## 2016-04-12 DIAGNOSIS — K5900 Constipation, unspecified: Secondary | ICD-10-CM | POA: Diagnosis not present

## 2016-04-12 DIAGNOSIS — E782 Mixed hyperlipidemia: Secondary | ICD-10-CM | POA: Diagnosis not present

## 2016-04-12 DIAGNOSIS — Z0000 Encounter for general adult medical examination without abnormal findings: Secondary | ICD-10-CM

## 2016-04-12 DIAGNOSIS — B029 Zoster without complications: Secondary | ICD-10-CM

## 2016-04-12 DIAGNOSIS — K5909 Other constipation: Secondary | ICD-10-CM

## 2016-04-12 DIAGNOSIS — N952 Postmenopausal atrophic vaginitis: Secondary | ICD-10-CM | POA: Diagnosis not present

## 2016-04-12 MED ORDER — ACYCLOVIR 800 MG PO TABS: 800.0000 mg | ORAL_TABLET | Freq: Every day | ORAL | 0 refills | Status: DC

## 2016-04-12 MED ORDER — POLYETHYLENE GLYCOL 3350 17 G PO PACK: 17.0000 g | PACK | Freq: Every day | ORAL | 0 refills | Status: DC

## 2016-04-12 NOTE — Patient Instructions (Addendum)
For your shingles, I sent in a medicine called acyclovir that you will take one tablet five times a day for ten days (until you run out of meds).  You can use the hydrocortisone cream for itching but I would recommend avoiding rubbing alcohol as it may irritate it more in the long term. You can also pick up a cream called Chaledryl at the pharmacy (combination of benadryl and chamomile lotion) to help with the itching.   If the rash spreads to other areas, please call the clinic and we will see you again.  I will call you with your pap results.  Please make an appointment to see me in about 6 months. Please don't hesitate to call the clinic if you need [place] before then.

## 2016-04-12 NOTE — Progress Notes (Signed)
CC: rash  HPI:  [name] is a 56 y.o. with a PMH of MS followed by [facility], H[facility], presenting to clinic for rash, cervical cancer screening, and constipation.   Patient states that for the last week she has noticed a rash on her back and right side that is associated with itching and pain. She denies swelling, fevers, chills, drainage, or other sites of rash. She denies prior similar rashes or shingles vaccination.   Patient with last Pap smear on 02/2011 - there was presence of atrophic vaginitis and was not able to be fully examined for malignancy due to the associated inflammation. Patient states her last period was about 7 years ago; she denies vaginal discharge or bleeding, vaginal discomfort, lesions; she is not sexually active.   Patient states she had a colonoscopy done about 2 years ago, but does not have the paperwork; there are no records in epic - she was referred to Dr. Elnoria Howard in 2013 for screening colonoscopy. Patient states she was told nothing was found. Will get release of records next time patient is in office.   She has constipation, without abdominal pain, melena or hematochezia.   Please see problem based Assessment and Plan for status of patients chronic conditions.  Past Medical History:  Diagnosis Date  . Abnormality of gait 06/10/2012  . Depression   . Headache   . Hyperlipidemia   . MS (multiple sclerosis) ([facility])     Review of Systems:   Review of Systems  Constitutional: Negative for chills, fever and weight loss.  HENT: Negative for hearing loss.   Eyes: Negative for blurred vision and double vision.  Respiratory: Negative for cough and shortness of breath.   Cardiovascular: Positive for leg swelling. Negative for chest pain.  Gastrointestinal: Positive for constipation. Negative for abdominal pain, blood in stool, diarrhea, melena, nausea and vomiting.  Genitourinary: Negative for dysuria, frequency, hematuria and urgency.  Skin: Positive for  itching and rash.  Neurological: Negative for dizziness, tingling, sensory change, focal weakness, weakness and headaches.    Physical Exam:  Vitals:   04/12/16 1037  BP: 118/65  Pulse: 69  Temp: 97.7 F (36.5 C)  TempSrc: Oral  SpO2: 100%  Weight: 141 lb 12.8 oz (64.3 kg)  Height: 5' (1.524 m)   Physical Exam  Constitutional: She is oriented to person, place, and time. She appears well-developed and well-nourished. No distress.  HENT:  Head: Normocephalic and atraumatic.  Eyes: Conjunctivae and EOM are normal. No scleral icterus.  Neck: Normal range of motion. Neck supple.  Cardiovascular: Normal rate, regular rhythm, normal heart sounds and intact distal pulses.  Exam reveals no gallop and no friction rub.   No murmur heard. Pulmonary/Chest: Effort normal and breath sounds normal. She has no wheezes. She has no rales.  Abdominal: Soft. Bowel sounds are normal. She exhibits no distension and no mass. There is no tenderness. There is no guarding.  Genitourinary:  Genitourinary Comments: No external lesions; vaginal walls very friable with easy bleeding and tender with presence of atrophy. No vaginal discharge noted, no cervical lesions noted  Musculoskeletal: Normal range of motion. She exhibits edema (nonpitting, dependent bil LE edema). She exhibits no tenderness.  Neurological: She is alert and oriented to person, place, and time.  Skin: Skin is warm and dry. Capillary refill takes less than 2 seconds. Rash (crusted lesions along right t4 dermatome; no lesions on ears or face) noted. She is not diaphoretic. No erythema.  Psychiatric: She has a  normal mood and affect. Her behavior is normal. Judgment and thought content normal.    Assessment & Plan:   See Encounters Tab for problem based charting.   Patient discussed with Dr. Argentina Ponder, MD Internal Medicine PGY1

## 2016-04-13 LAB — LIPID PANEL
CHOLESTEROL TOTAL: 230 mg/dL — AB (ref 100–199)
Chol/HDL Ratio: 3.1 ratio units (ref 0.0–4.4)
HDL: 75 mg/dL (ref >39)
LDL Calculated: 119 mg/dL — ABNORMAL HIGH (ref 0–99)
Triglycerides: 179 mg/dL — ABNORMAL HIGH (ref 0–149)
VLDL CHOLESTEROL CAL: 36 mg/dL (ref 5–40)

## 2016-04-13 LAB — HIV ANTIBODY (ROUTINE TESTING W REFLEX): HIV SCREEN 4TH GENERATION: NONREACTIVE

## 2016-04-13 LAB — CERVICOVAGINAL ANCILLARY ONLY: WET PREP (BD AFFIRM): NEGATIVE

## 2016-04-14 NOTE — Assessment & Plan Note (Signed)
Patient with last Pap smear on 02/2011 - there was presence of atrophic vaginitis and was not able to be fully examined for malignancy due to the associated inflammation. Patient states her last period was about 7 years ago; she denies vaginal discharge or bleeding, vaginal discomfort, lesions; she is not sexually active.  Exam reveals vaginal atrophy, significant discomfort due to pain; no lesions appreciated.  Plan: --f/u pap, GC/Chlamydia, wet prep --continue discussion on treatment for atrophic vaginitis --patient currently on betaseron therapy for MS which does not carry increased risk of neoplasms thus regular recommendations for 55yr pap smears (if normal) stands at this time unless treatment is changed.

## 2016-04-14 NOTE — Assessment & Plan Note (Signed)
Patient states that for the last week she has noticed a rash on her back and right side that is associated with itching and pain. She denies swelling, fevers, chills, drainage, or other sites of rash. She denies prior similar rashes or shingles vaccination.   Exam revealed scabbed rash along right T4 dermatome consistent with shingles. Her Betaseron can also cause a rash but her presentation is very localized and consistent with shingles.  Plan: --acyclovir --f/u if pain is not improved with treatment

## 2016-04-14 NOTE — Assessment & Plan Note (Signed)
Pelvic exam with evidence of vaginal atrophy. Patient asymptomatic however.   Plan: --f/u pap, gc/chlamydia, trich, bac vaginosis studies --patient not interested at this time in intravaginal estrogen as she is asymptomatic, however had very significant discomfort with examination due to pain

## 2016-04-14 NOTE — Assessment & Plan Note (Signed)
Patient states she had a colonoscopy done about 2 years ago, but does not have the paperwork; there are no records in epic - she was referred to Dr. Elnoria Howard in 2013 for screening colonoscopy. Patient states she was told nothing was found. Will get release of records next time patient is in office.  Mammogram in 06/2015 was normal.   Plan: --pap today --will try to obtain records of colonoscopy at next visit with release of records from Dr. Haywood Pao office --lipid panel today - not candidate for statin therapy --hiv screen today - nonreactive

## 2016-04-14 NOTE — Assessment & Plan Note (Signed)
Patient has not been screened for HIV. Denies known exposures.  Plan: --HIV ab - nonreactive

## 2016-04-14 NOTE — Assessment & Plan Note (Signed)
Patient with history of hyperlipidemia not on therapy due to low ASCVD risk (4%).   Plan: --annual lipid panel - Chol 230, HDL 75, LDL 119 --ASCVD risk 2.7% - not candidate for therapy

## 2016-04-14 NOTE — Assessment & Plan Note (Addendum)
Patient with constipation, without abdominal pain, melena, hematochezia. She supplements with fiber as well as eating mostly green leafy vegetables but drinks only about 50oz of water a day.  Consistent with mismatch between fiber and water intake.  Plan: --advised increase in water intake to match her high intake of fiber --advised miralax use

## 2016-04-15 LAB — URINE CYTOLOGY ANCILLARY ONLY
Chlamydia: NEGATIVE
NEISSERIA GONORRHEA: NEGATIVE

## 2016-04-15 NOTE — Progress Notes (Addendum)
Medicine attending: I personally interviewed and briefly examined this patient on the day of the patient visit and reviewed pertinent clinical ,laboratory, and radiographic data  with resident physician Dr. Nyra Market and we discussed a management plan. No herpetic lesions despite positive serology obtained at recent ED visit but we will Rx for HSV & Trichomonas.  Above note was for another patient. My error. Current patient with resolving Herpes Zoster. JG 04/18/16

## 2016-04-16 LAB — CYTOLOGY - PAP
Diagnosis: NEGATIVE
HPV (WINDOPATH): NOT DETECTED

## 2016-06-27 IMAGING — US US ABDOMEN LIMITED
1 series · 14 of 25 positions shown · non-contrast
Comparison: None.

CLINICAL DATA: 54-year-old female with right upper quadrant
abdominal pain

EXAM:
US ABDOMEN LIMITED - RIGHT UPPER QUADRANT

[Series 1: us abdomen limited · 0.19mm/px · 14 of 46 slices shown]
[im 1/46]
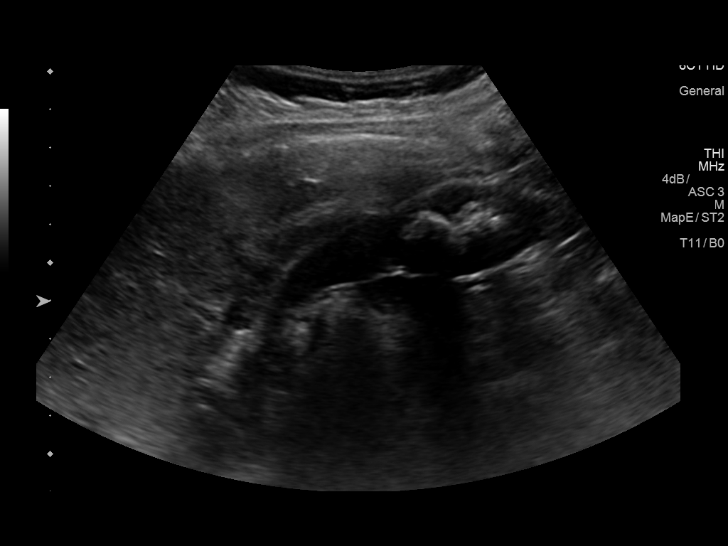
[im 4/46]
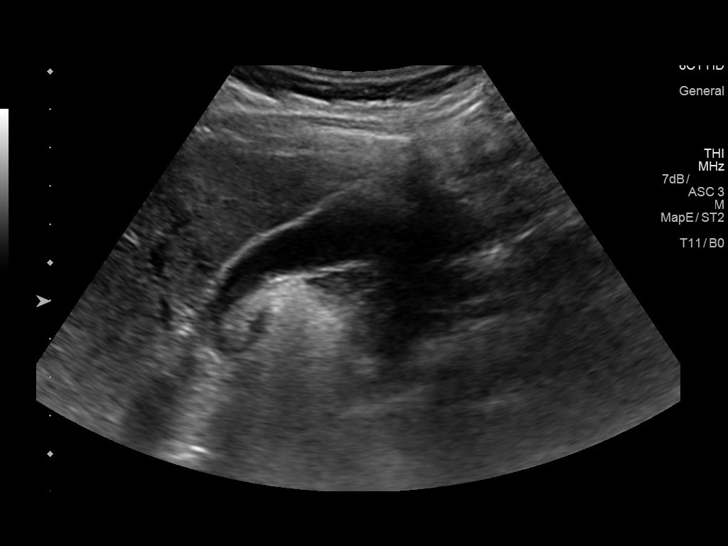
[im 8/46]
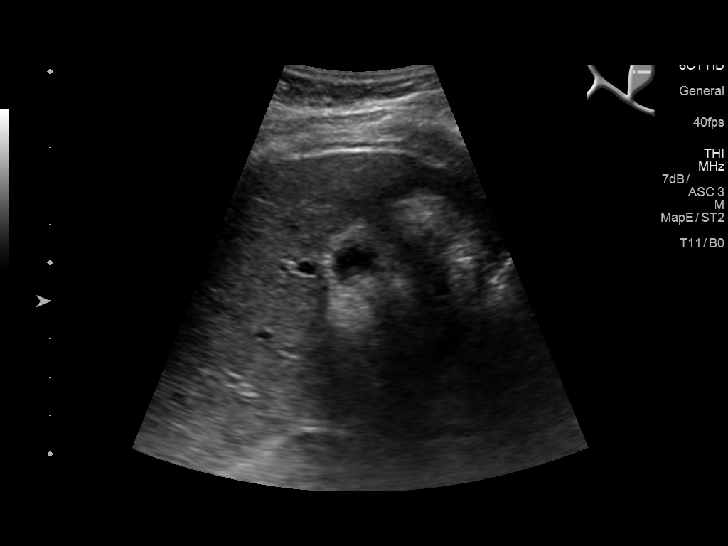
[im 12/46]
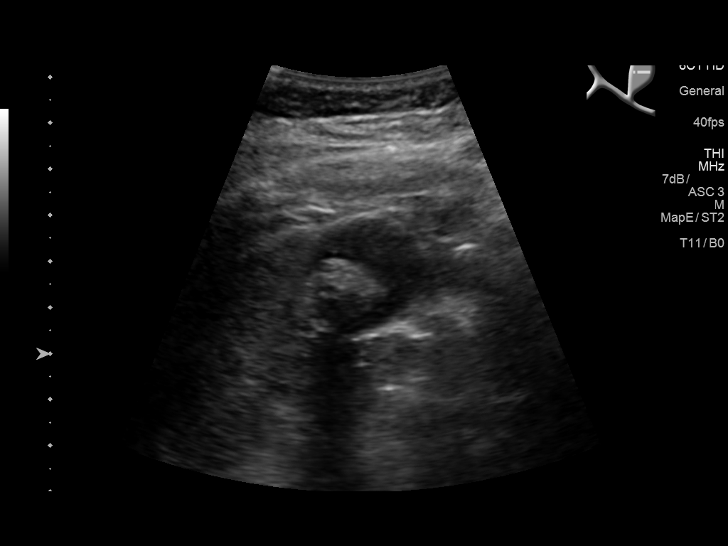
[im 16/46]
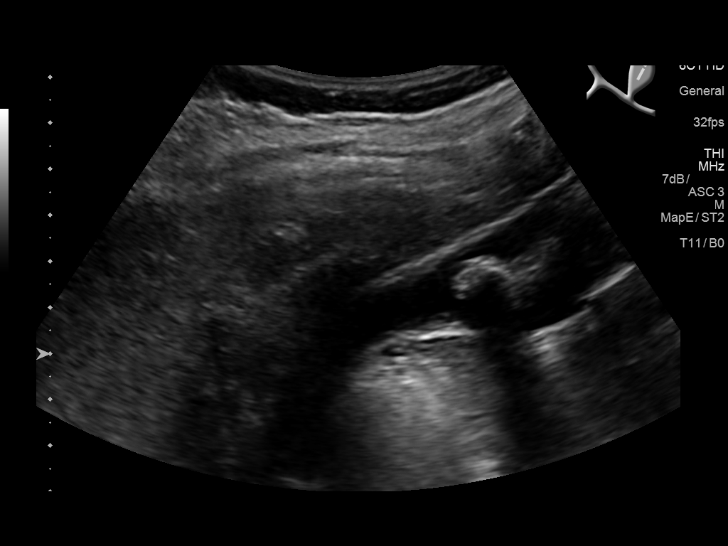
[im 17/46]
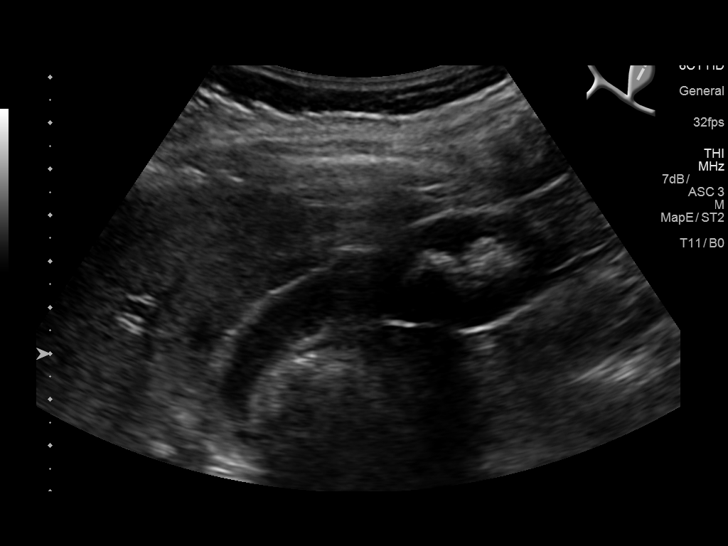
[im 21/46]
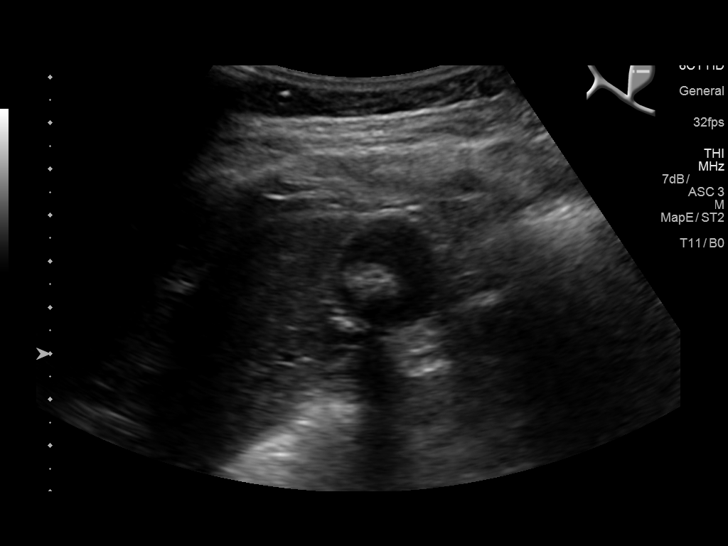
[im 25/46]
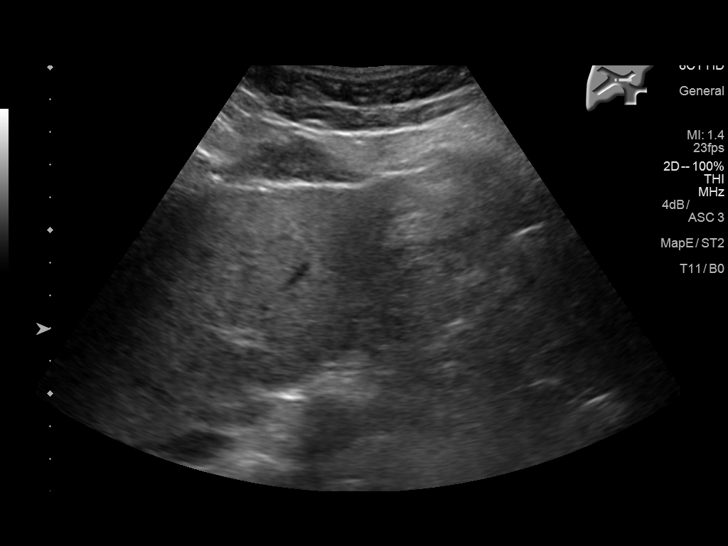
[im 29/46]
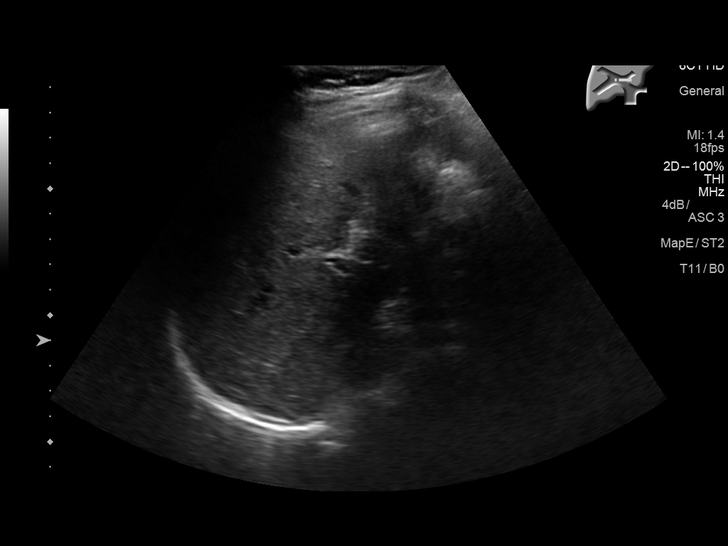
[im 31/46]
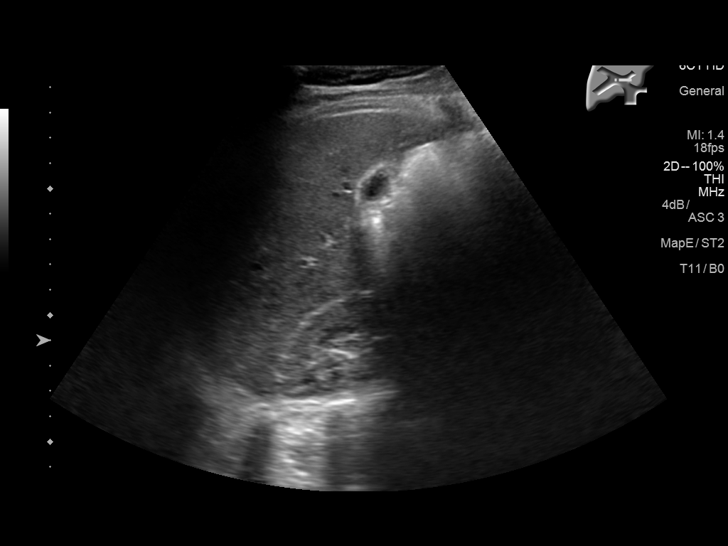
[im 34/46]
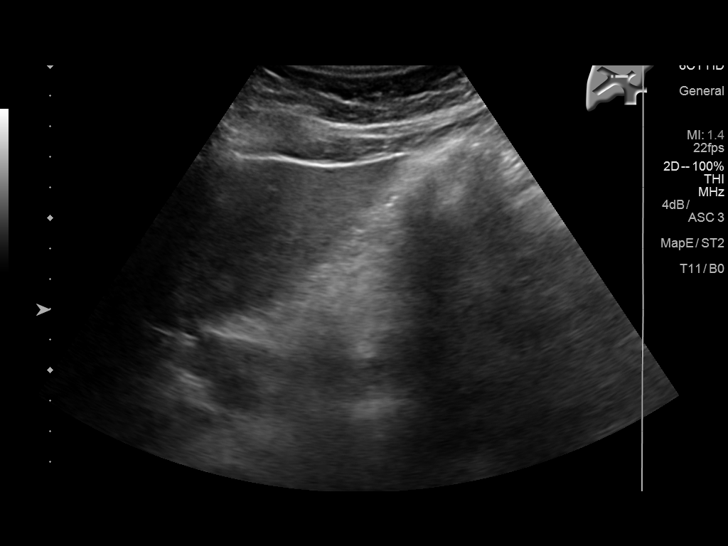
[im 38/46]
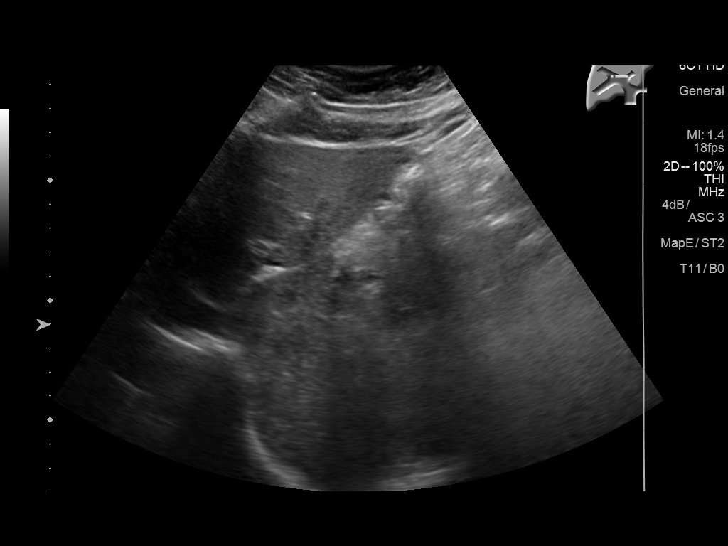
[im 42/46]
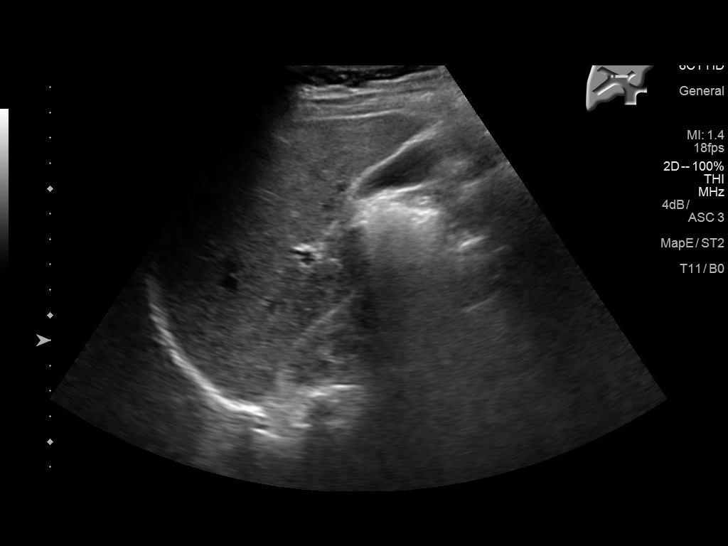
[im 46/46]
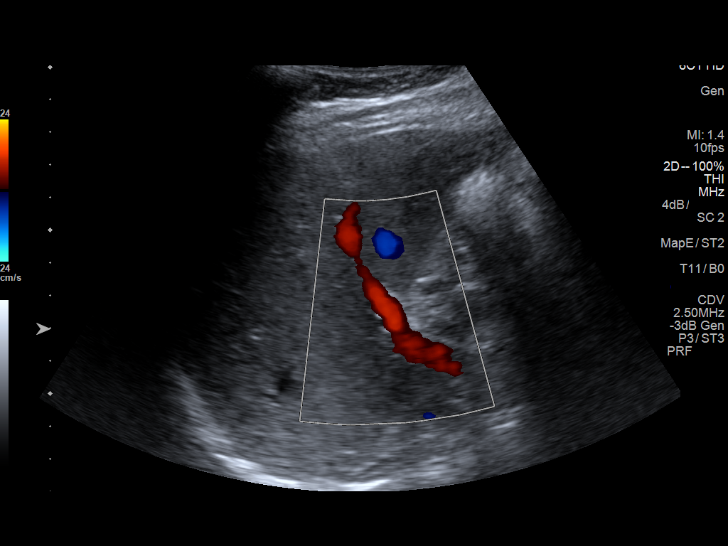

[14 of 25 positions shown; findings below may reference images not displayed]

FINDINGS: Gallbladder:

There multiple stones within the gallbladder. There is no
gallbladder wall thickening or pericholecystic fluid. Negative
sonographic Murphy's sign.

Common bile duct:

Diameter: 5 mm

Liver:

No focal lesion identified. Within normal limits in parenchymal
echogenicity.
IMPRESSION: Cholelithiasis without sonographic evidence of acute cholecystitis.
A hepatobiliary scintigraphy may provide better evaluation of the
gallbladder if an acute cholecystitis is clinically suspected.

## 2016-08-20 ENCOUNTER — Ambulatory Visit: Payer: Medicare Other | Admitting: Neurology

## 2016-08-22 ENCOUNTER — Encounter: Payer: Self-pay | Admitting: Neurology

## 2016-08-22 ENCOUNTER — Ambulatory Visit (INDEPENDENT_AMBULATORY_CARE_PROVIDER_SITE_OTHER): Payer: Medicare Other | Admitting: Neurology

## 2016-08-22 ENCOUNTER — Telehealth: Payer: Self-pay | Admitting: *Deleted

## 2016-08-22 VITALS — BP 126/82 | HR 59 | Ht 60.0 in | Wt 143.7 lb

## 2016-08-22 DIAGNOSIS — Z5181 Encounter for therapeutic drug level monitoring: Secondary | ICD-10-CM | POA: Diagnosis not present

## 2016-08-22 DIAGNOSIS — G35 Multiple sclerosis: Secondary | ICD-10-CM

## 2016-08-22 DIAGNOSIS — R269 Unspecified abnormalities of gait and mobility: Secondary | ICD-10-CM

## 2016-08-22 MED ORDER — TRAZODONE HCL 50 MG PO TABS: 50.0000 mg | ORAL_TABLET | Freq: Every day | ORAL | 3 refills | Status: DC

## 2016-08-22 NOTE — Telephone Encounter (Signed)
[facility] contacted. Scheduled routine lab pick up, lock box. Confirmation number: [id number].

## 2016-08-22 NOTE — Patient Instructions (Signed)
   We will get MRI of the brain and get blood work today. 

## 2016-08-22 NOTE — Progress Notes (Signed)
Reason for visit: Multiple sclerosis  Kristen Boyle is an 56 y.o. female  History of present illness:  Kristen Boyle is a 56 year old right-handed black female with a history of multiple sclerosis primarily with a left hemiparesis. The patient also has some blurring of vision involving the left eye, visual evoked response test showed significant abnormalities bilaterally. The patient is on Betaseron, she is tolerating the medication fairly well, she has been tried on Gilenya previously but did not tolerate the drug. The patient believes that there has been some progression of her ability to walk since last seen one year ago. She has not had any recent falls, she uses a cane for ambulation. She has some problems with constipation and some difficulty with chronic insomnia. The patient reports that within the last day or so she has developed some pain in the left foot. She denies any other sudden new problems with numbness or weakness. She does have some urinary urgency with the bladder.  Past Medical History:  Diagnosis Date  . Abnormality of gait 06/10/2012  . Depression   . Headache   . Hyperlipidemia   . MS (multiple sclerosis) ([place])     Past Surgical History:  Procedure Laterality Date  . CHOLECYSTECTOMY N/A 06/20/2015   Procedure: LAPAROSCOPIC CHOLECYSTECTOMY;  Surgeon: Ralene Ok, MD;  Location: [facility];  Service: General;  Laterality: N/A;  . DENTAL SURGERY    . TUBAL LIGATION Bilateral     Family History  Problem Relation Age of Onset  . Alcoholism Father   . Diabetes Paternal Aunt   . Dementia Paternal Grandmother        Senile dementia of Alzheimer's type  . Colon cancer Cousin   . Colon cancer Maternal Aunt     Social history:  reports that she has never smoked. She has never used smokeless tobacco. She reports that she does not drink alcohol or use drugs.   No Known Allergies  Medications:  Prior to Admission medications   Medication Sig Start Date End  Date Taking? Authorizing Provider  Cholecalciferol 1000 units tablet Take 1,000 Units by mouth daily.   Yes [provider]  ibuprofen (ADVIL,MOTRIN) 200 MG tablet Take 400 mg by mouth every 6 (six) hours as needed (For pain.).   Yes [provider]  Interferon Beta-1b (BETASERON) 0.3 MG KIT injection Inject 0.29m (161m sub-q every other day 08/23/15  Yes WiKathrynn DuckingMD  polyethylene glycol ([facility] GLYCOLAX) packet Take 17 g by mouth daily. Patient taking differently: Take 17 g by mouth daily as needed.  04/12/16  Yes SvAlphonzo GrieveMD  PARoxetine (PAXIL) 20 MG tablet Take 1 tablet (20 mg total) by mouth daily. 06/30/15 06/29/16  RaJuluis MireMD    ROS:  Out of a complete 14 system review of symptoms, the patient complains only of the following symptoms, and all other reviewed systems are negative.  Fatigue Double vision, blurred vision Leg swelling Heat intolerance Constipation Daytime sleepiness Headache, numbness, weakness Depression  Blood pressure 126/82, pulse (!) 59, height 5' (1.524 m), weight 143 lb 11.2 oz (65.2 kg).  Physical Exam  General: The patient is alert and cooperative at the time of the examination.  Skin: 1+ edema below the knees is seen bilaterally.   Neurologic Exam  Mental status: The patient is alert and oriented x 3 at the time of the examination. The patient has apparent normal recent and remote memory, with an apparently normal attention span and concentration ability.  Cranial nerves: Facial symmetry is present. Speech is normal, no aphasia or dysarthria is noted. Extraocular movements are full. With superior gaze, there is exotropia of the left eye. Visual fields are full. Pupils are equal, round, and reactive to light. Discs are flat bilaterally, both sides are slightly pale.  Motor: The patient has good strength in all 4 extremities, with exception of slight weakness of intrinsic muscles of the left hand, diffuse 4-/5  strength of the left leg.  Sensory examination: Soft touch sensation is symmetric on the face and arms, decreased on the left leg.  Coordination: The patient has good finger-nose-finger bilaterally. The patient has difficulty performing heel-to-shin with the left leg, can perform on the right.  Gait and station: The patient has a circumduction type gait with the left leg, the patient walks with a cane. Gait is unsteady. Tandem gait was not attempted. Romberg is negative.  Reflexes: Deep tendon reflexes are symmetric.   Assessment/Plan:  1. Multiple sclerosis  2. Gait disturbance, left hemiparesis  3. Neurogenic bladder  4. Chronic insomnia  The patient will be given trazodone 50 mg tablet at night for sleep. We will send her for blood work today, and consider changing to another medication for multiple sclerosis that is somewhat more effective. The patient will have MRI of the brain, she never had this study done after the last visit. We may consider Tysabri or Ocrevus. She will follow-up in 6 months.  Jill Alexanders MD 08/22/2016 2:29 PM  [facility] [address] [place], [place] [address]  Phone [phone] Fax [phone]

## 2016-08-23 LAB — COMPREHENSIVE METABOLIC PANEL
A/G RATIO: 1.4 (ref 1.2–2.2)
ALT: 18 IU/L (ref 0–32)
AST: 25 IU/L (ref 0–40)
Albumin: 4.7 g/dL (ref 3.5–5.5)
Alkaline Phosphatase: 89 IU/L (ref 39–117)
BILIRUBIN TOTAL: 0.3 mg/dL (ref 0.0–1.2)
BUN / CREAT RATIO: 16 (ref 9–23)
BUN: 16 mg/dL (ref 6–24)
CALCIUM: 9.7 mg/dL (ref 8.7–10.2)
CHLORIDE: 103 mmol/L (ref 96–106)
CO2: 24 mmol/L (ref 20–29)
Creatinine, Ser: 0.99 mg/dL (ref 0.57–1.00)
GFR, EST AFRICAN AMERICAN: 73 mL/min/{1.73_m2} (ref >59)
GFR, EST NON AFRICAN AMERICAN: 63 mL/min/{1.73_m2} (ref >59)
GLOBULIN, TOTAL: 3.3 g/dL (ref 1.5–4.5)
Glucose: 79 mg/dL (ref 65–99)
POTASSIUM: 4 mmol/L (ref 3.5–5.2)
SODIUM: 144 mmol/L (ref 134–144)
TOTAL PROTEIN: 8 g/dL (ref 6.0–8.5)

## 2016-08-23 LAB — CBC WITH DIFFERENTIAL/PLATELET
BASOS ABS: 0 10*3/uL (ref 0.0–0.2)
BASOS: 0 %
EOS (ABSOLUTE): 0.2 10*3/uL (ref 0.0–0.4)
Eos: 4 %
Hematocrit: 39 % (ref 34.0–46.6)
Hemoglobin: 12.9 g/dL (ref 11.1–15.9)
IMMATURE GRANS (ABS): 0 10*3/uL (ref 0.0–0.1)
Immature Granulocytes: 0 %
Lymphocytes Absolute: 3.2 10*3/uL — ABNORMAL HIGH (ref 0.7–3.1)
Lymphs: 66 %
MCH: 25.2 pg — AB (ref 26.6–33.0)
MCHC: 33.1 g/dL (ref 31.5–35.7)
MCV: 76 fL — AB (ref 79–97)
MONOS ABS: 0.3 10*3/uL (ref 0.1–0.9)
Monocytes: 7 %
NEUTROS ABS: 1.1 10*3/uL — AB (ref 1.4–7.0)
Neutrophils: 23 %
PLATELETS: 244 10*3/uL (ref 150–379)
RBC: 5.11 x10E6/uL (ref 3.77–5.28)
RDW: 15.7 % — ABNORMAL HIGH (ref 12.3–15.4)
WBC: 4.8 10*3/uL (ref 3.4–10.8)

## 2016-08-23 LAB — HEPATITIS B CORE ANTIBODY, TOTAL: HEP B C TOTAL AB: NEGATIVE

## 2016-08-23 LAB — HEPATITIS B SURFACE ANTIGEN: Hepatitis B Surface Ag: NEGATIVE

## 2016-08-23 LAB — HEPATITIS C ANTIBODY: Hep C Virus Ab: <0.1 s/co ratio (ref 0.0–0.9)

## 2016-08-23 LAB — VARICELLA ZOSTER ANTIBODY, IGG

## 2016-08-28 ENCOUNTER — Telehealth: Payer: Self-pay | Admitting: Neurology

## 2016-08-28 LAB — QUANTIFERON IN TUBE
QUANTIFERON TB AG VALUE: 0.11 [IU]/mL
QUANTIFERON TB GOLD: NEGATIVE
Quantiferon Nil Value: 0.15 IU/mL

## 2016-08-28 LAB — QUANTIFERON TB GOLD ASSAY (BLOOD)

## 2016-08-28 NOTE — Telephone Encounter (Signed)
JC virus antibody is positive with a index value 1.34.  If we change therapies, we likely will need to go to ocrevus.  MRI of the brain is pending, I will discuss this result with her when I talked to her about the MRI.

## 2016-09-03 ENCOUNTER — Telehealth: Payer: Self-pay | Admitting: *Deleted

## 2016-09-03 NOTE — Telephone Encounter (Signed)
Kristen Boyle called from [place]. Transferred me to [employer]). Gave VO for rx refill Betaseron. Directions: inject 0.'25mg'$  sub-q every other day. Qty 14 Kit, 11 refills. She verbalized understanding.

## 2016-09-24 NOTE — Telephone Encounter (Signed)
error 

## 2016-10-09 ENCOUNTER — Encounter: Payer: Medicare Other | Admitting: Internal Medicine

## 2016-10-30 ENCOUNTER — Encounter: Payer: Medicare Other | Admitting: Internal Medicine

## 2016-11-26 NOTE — Progress Notes (Signed)
   CC: leg weakness  HPI:  Kristen Boyle is a 56 y.o. with a PMH of multiple sclerosis presenting to clinic for leg weakness in setting of her MS and MDD.  Left leg weakness/MS: Patient reports continued leg weakness for the last couple of months on the LLE with intermittent drop foot (though per chart review this has been present for a couple of years. This is the same extremity that has previously been affected and continuously been giving the patient trouble. She reports having a cane, a walker and a wheelchair at home. She reports not being comfortable going to the grocery store by herself any longer due to her left leg weakness but she will go with her daughter at times. She reports some double vision when watching television only. During her last neurology appointment, she was referred to ophtho as she was found to have bil abnormalities on visual evoked potentials testing. Patient denies chest pain, shortness of breath, new numbness, new weakness, urinary or bowel incontinence. Her insurance will not cover PT per patient though she is interested in it; she is currently not able to afford out-of-pocket for it. She does state she goes to the gym sometimes with her daughter and does have a stationary bicycle at home which she uses 1-2 per week.   MDD: Patient states that she only takes paxil as needed on days she feels sad because she doesn't like how paxil makes her feel (more irritable, not herself). She reports poor sleep, poor appetite, irritable mood, anhedonia, trouble concentrating, feeling like a burden.   Please see problem based Assessment and Plan for status of patients chronic conditions.  Past Medical History:  Diagnosis Date  . Abnormality of gait 06/10/2012  . Depression   . Headache   . Hyperlipidemia   . MS (multiple sclerosis) (HCC)     Review of Systems:   ROS Per HPI  Physical Exam:  Vitals:   11/27/16 1435  BP: 123/76  Pulse: (!) 59  Temp: 98.3 F  (36.8 C)  TempSrc: Oral  SpO2: 100%  Weight: 144 lb 4.8 oz (65.5 kg)  Height: 5' (1.524 m)   GENERAL- alert, co-operative, appears as stated age, not in any distress. HEENT- Atraumatic, normocephalic, PERRL, EOMI, oral mucosa appears moist CARDIAC- RRR, no murmurs, rubs or gallops. RESP- Moving equal volumes of air, and clear to auscultation bilaterally, no wheezes or crackles. ABDOMEN- Soft, nontender, bowel sounds present. NEURO- CN 2-12 intact. She does report central vision loss on left eye, peripheral fields intact. EXTREMITIES- pulse 2+, symmetric, bilateral dependent edema. 3/5 LLE strength, sensation intact throughout. SKIN- Warm, dry, no rash or lesion. PSYCH- Normal mood and affect, appropriate thought content and speech.  Assessment & Plan:   See Encounters Tab for problem based charting.   Patient discussed with Dr. Fredrich RomansMullen   Jeri Jeanbaptiste, MD Internal Medicine PGY2

## 2016-11-27 ENCOUNTER — Ambulatory Visit (INDEPENDENT_AMBULATORY_CARE_PROVIDER_SITE_OTHER): Payer: Medicare Other | Admitting: Internal Medicine

## 2016-11-27 VITALS — BP 123/76 | HR 59 | Temp 98.3°F | Ht 60.0 in | Wt 144.3 lb

## 2016-11-27 DIAGNOSIS — F321 Major depressive disorder, single episode, moderate: Secondary | ICD-10-CM

## 2016-11-27 DIAGNOSIS — M21372 Foot drop, left foot: Secondary | ICD-10-CM

## 2016-11-27 DIAGNOSIS — H5462 Unqualified visual loss, left eye, normal vision right eye: Secondary | ICD-10-CM | POA: Diagnosis not present

## 2016-11-27 DIAGNOSIS — G35 Multiple sclerosis: Secondary | ICD-10-CM | POA: Diagnosis present

## 2016-11-27 DIAGNOSIS — F339 Major depressive disorder, recurrent, unspecified: Secondary | ICD-10-CM

## 2016-11-27 DIAGNOSIS — Z0000 Encounter for general adult medical examination without abnormal findings: Secondary | ICD-10-CM

## 2016-11-27 DIAGNOSIS — R94112 Abnormal visually evoked potential [VEP]: Secondary | ICD-10-CM

## 2016-11-27 MED ORDER — SERTRALINE HCL 50 MG PO TABS: 50.0000 mg | ORAL_TABLET | Freq: Every day | ORAL | 2 refills | Status: DC

## 2016-11-27 NOTE — Patient Instructions (Addendum)
[facility] for your brain MRI [address] Call  [phone] to set up your appointment  Please call if you need equipment at home and I'll be happy to order it.

## 2016-11-28 ENCOUNTER — Encounter: Payer: Self-pay | Admitting: Internal Medicine

## 2016-11-28 NOTE — Assessment & Plan Note (Signed)
Patient states that she only takes paxil as needed on days she feels sad because she doesn't like how paxil makes her feel (more irritable, not herself). She reports poor sleep, poor appetite, irritable mood, anhedonia, trouble concentrating, feeling like a burden.   Patient with signs/symptoms of depression. She is interested in changing therapy from paxil.  Plan: --start zoloft 50mg  daily, f/u in 4-6 weeks for effect and titration

## 2016-11-28 NOTE — Assessment & Plan Note (Signed)
Patient reports continued leg weakness for the last couple of months on the LLE with intermittent drop foot (though per chart review this has been present for a couple of years. This is the same extremity that has previously been affected and continuously been giving the patient trouble. She reports having a cane, a walker and a wheelchair at home. She reports not being comfortable going to the grocery store by herself any longer due to her left leg weakness but she will go with her daughter at times. She reports some double vision when watching television only. During her last neurology appointment, she was referred to ophtho as she was found to have bil abnormalities on visual evoked potentials testing. Patient denies chest pain, shortness of breath, new numbness, new weakness, urinary or bowel incontinence. Her insurance will not cover PT per patient though she is interested in it; she is currently not able to afford out-of-pocket for it. She does state she goes to the gym sometimes with her daughter and does have a stationary bicycle at home which she uses 1-2 per week.   Exam reveals 3/5 strength in LLE; patient reported central vision loss on exam of left eye only otherwise CN intact.  Plan: --given patient info to schedule her brain MRI per neuro recs; she will f/u with neuro again in Feb '19 --advised patient to schedule her ophtho appt --advised patient to use her stationary bike daily even if for 10 mins at a time --advised to let us know if she needs orders for equipment at home - she currently denies needs and has cane, walker, wheelchair

## 2016-11-28 NOTE — Assessment & Plan Note (Signed)
Patient signed release of records for her colonoscopy with Dr. Elnoria Howard. She declined flu vaccination at this time, but will think about it.

## 2016-11-29 NOTE — Progress Notes (Signed)
Internal Medicine Clinic Attending  Case discussed with Dr. Svalina  at the time of the visit.  We reviewed the resident's history and exam and pertinent patient test results.  I agree with the assessment, diagnosis, and plan of care documented in the resident's note.  

## 2017-01-01 ENCOUNTER — Encounter: Payer: Medicare Other | Admitting: Internal Medicine

## 2017-01-01 NOTE — Progress Notes (Deleted)
   CC: ***  HPI:  Ms.Willis A Metheney is a 56 y.o. with a PMH of ***  Please see problem based Assessment and Plan for status of patients chronic conditions.  Past Medical History:  Diagnosis Date  . Abnormality of gait 06/10/2012  . Depression   . Headache   . Hyperlipidemia   . MS (multiple sclerosis) (HCC)     Review of Systems:   ROS  Physical Exam:  There were no vitals filed for this visit. GENERAL- alert, co-operative, appears as stated age, not in any distress. HEENT- Atraumatic, normocephalic, PERRL, EOMI, oral mucosa appears moist CARDIAC- RRR, no murmurs, rubs or gallops. RESP- Moving equal volumes of air, and clear to auscultation bilaterally, no wheezes or crackles. ABDOMEN- Soft, nontender, bowel sounds present. NEURO- No obvious Cr N abnormality. EXTREMITIES- pulse 2+, symmetric, no pedal edema. SKIN- Warm, dry, No rash or lesion. PSYCH- Normal mood and affect, appropriate thought content and speech.  Assessment & Plan:   See Encounters Tab for problem based charting.   Patient {GC/GE:3044014::"discussed with","seen with"} Dr. {NAMES:3044014::"Butcher","Granfortuna","E. Hoffman","Klima","Mullen","Narendra","Vincent"}   Nyra Market, MD Internal Medicine PGY2

## 2017-02-25 ENCOUNTER — Ambulatory Visit (INDEPENDENT_AMBULATORY_CARE_PROVIDER_SITE_OTHER): Payer: Medicare Other | Admitting: Neurology

## 2017-02-25 ENCOUNTER — Encounter: Payer: Self-pay | Admitting: Neurology

## 2017-02-25 VITALS — BP 134/85 | HR 63 | Ht 60.0 in | Wt 145.0 lb

## 2017-02-25 DIAGNOSIS — G35 Multiple sclerosis: Secondary | ICD-10-CM | POA: Diagnosis not present

## 2017-02-25 DIAGNOSIS — Z5181 Encounter for therapeutic drug level monitoring: Secondary | ICD-10-CM

## 2017-02-25 DIAGNOSIS — R269 Unspecified abnormalities of gait and mobility: Secondary | ICD-10-CM

## 2017-02-25 NOTE — Patient Instructions (Signed)
   We will get MRI of the brain and consider a switch to Ocrevus.

## 2017-02-25 NOTE — Progress Notes (Signed)
Reason for visit: Multiple sclerosis  Kristen Boyle is an 57 y.o. female  History of present illness:  Kristen Boyle is a 57 year old right-handed black female with a history of multiple sclerosis associated with a gait disorder and some visual impairment with bilateral optic neuritis.  The patient walks with a cane or a walker.  She believes that her ability to ambulate has worsened over the last year.  The patient has not had any recent falls.  She denies any recent changes in bowel or bladder function, she does have some diarrhea alternating with constipation.  She indicates that her bladder works fairly well.  The patient was sent for blood work last summer, she is JC viral antibody positive.  The patient was set up for MRI of the brain but this was never done.  The patient returns to the office today for an evaluation.  Past Medical History:  Diagnosis Date  . Abnormality of gait 06/10/2012  . Depression   . Headache   . Hyperlipidemia   . MS (multiple sclerosis) ([place])     Past Surgical History:  Procedure Laterality Date  . CHOLECYSTECTOMY N/A 06/20/2015   Procedure: LAPAROSCOPIC CHOLECYSTECTOMY;  Surgeon: Ralene Ok, MD;  Location: [facility];  Service: General;  Laterality: N/A;  . DENTAL SURGERY    . TUBAL LIGATION Bilateral     Family History  Problem Relation Age of Onset  . Alcoholism Father   . Diabetes Paternal Aunt   . Dementia Paternal Grandmother        Senile dementia of Alzheimer's type  . Colon cancer Cousin   . Colon cancer Maternal Aunt     Social history:  reports that  has never smoked. she has never used smokeless tobacco. She reports that she does not drink alcohol or use drugs.    Allergies  Allergen Reactions  . Gilenya [Fingolimod]     Stomach upset    Medications:  Prior to Admission medications   Medication Sig Start Date End Date Taking? Authorizing Provider  Cholecalciferol 1000 units tablet Take 1,000 Units by mouth daily.   Yes  [provider]  ibuprofen (ADVIL,MOTRIN) 200 MG tablet Take 400 mg by mouth every 6 (six) hours as needed (For pain.).   Yes [provider]  Interferon Beta-1b (BETASERON) 0.3 MG KIT injection Inject 0.'25mg'$  (44m) sub-q every other day 08/23/15  Yes WKathrynn Ducking MD  polyethylene glycol ([facility]/ GLYCOLAX) packet Take 17 g by mouth daily. Patient taking differently: Take 17 g by mouth daily as needed.  04/12/16  Yes SAlphonzo Grieve MD  sertraline (ZOLOFT) 50 MG tablet Take 1 tablet (50 mg total) daily by mouth. 11/27/16 02/25/17 Yes SAlphonzo Grieve MD  traZODone (DESYREL) 50 MG tablet Take 1 tablet (50 mg total) by mouth at bedtime. 08/22/16  Yes WKathrynn Ducking MD    ROS:  Out of a complete 14 system review of symptoms, the patient complains only of the following symptoms, and all other reviewed systems are negative.  Ringing in the ears, blurred vision Leg swelling Insomnia Joint pain, aching muscles, walking difficulty Dizziness, numbness, weakness Depression  Blood pressure 134/85, pulse 63, height 5' (1.524 m), weight 145 lb (65.8 kg).  Physical Exam  General: The patient is alert and cooperative at the time of the examination.  The patient has a flat affect.  Skin: No significant peripheral edema is noted.   Neurologic Exam  Mental status: The patient is alert and oriented x  3 at the time of the examination. The patient has apparent normal recent and remote memory, with an apparently normal attention span and concentration ability.   Cranial nerves: Facial symmetry is present. Speech is normal, no aphasia or dysarthria is noted. Extraocular movements are full. Visual fields are full.  Pupils are equal, round, and reactive to light.  Discs are flat bilaterally, optic atrophy is seen bilaterally.  Motor: The patient has good strength in the upper extremities, with exception some intrinsic muscle weakness on the left.  The left lower extremity has 3/5  strength with hip flexion, 4/5 strength with knee flexion extension, a significant foot drop is noted.  The right lower extremity has 4-4+/5 strength throughout.  Sensory examination: Soft touch sensation is symmetric on the face, arms, and legs.  Coordination: The patient has good finger-nose-finger bilaterally.  This patient is unable to perform heel-to-shin on the left, she can perform on the right.  Gait and station: The patient has a slightly wide-based, circumduction gait with the left leg, the patient walks with a cane.  Tandem gait was not attempted.  Romberg is negative. No drift is seen.  Reflexes: Deep tendon reflexes are symmetric.   Assessment/Plan:  1.  Multiple sclerosis  2.  Gait disorder  The patient has some left-sided weakness.  She believes that her ability to walk is changing, we will check MRI of the brain and consider a switch to [place].  The patient did sign the form today while she was here.  Blood work will be done today.  She will otherwise follow-up in 6 months.  Jill Alexanders MD 02/25/2017 12:55 PM  [facility] [address] [place],  [address]  Phone [phone] Fax [phone]

## 2017-02-26 LAB — CBC WITH DIFFERENTIAL/PLATELET
BASOS: 1 %
Basophils Absolute: 0 10*3/uL (ref 0.0–0.2)
EOS (ABSOLUTE): 0.1 10*3/uL (ref 0.0–0.4)
EOS: 2 %
HEMATOCRIT: 41.2 % (ref 34.0–46.6)
Hemoglobin: 12.8 g/dL (ref 11.1–15.9)
Immature Grans (Abs): 0 10*3/uL (ref 0.0–0.1)
Immature Granulocytes: 0 %
LYMPHS ABS: 3.3 10*3/uL — AB (ref 0.7–3.1)
Lymphs: 65 %
MCH: 24.8 pg — ABNORMAL LOW (ref 26.6–33.0)
MCHC: 31.1 g/dL — AB (ref 31.5–35.7)
MCV: 80 fL (ref 79–97)
MONOCYTES: 6 %
MONOS ABS: 0.3 10*3/uL (ref 0.1–0.9)
NEUTROS ABS: 1.3 10*3/uL — AB (ref 1.4–7.0)
Neutrophils: 26 %
Platelets: 284 10*3/uL (ref 150–379)
RBC: 5.16 x10E6/uL (ref 3.77–5.28)
RDW: 16.5 % — ABNORMAL HIGH (ref 12.3–15.4)
WBC: 5.1 10*3/uL (ref 3.4–10.8)

## 2017-02-26 LAB — COMPREHENSIVE METABOLIC PANEL
ALBUMIN: 4.9 g/dL (ref 3.5–5.5)
ALT: 192 IU/L — ABNORMAL HIGH (ref 0–32)
AST: 55 IU/L — ABNORMAL HIGH (ref 0–40)
Albumin/Globulin Ratio: 1.4 (ref 1.2–2.2)
Alkaline Phosphatase: 137 IU/L — ABNORMAL HIGH (ref 39–117)
BUN / CREAT RATIO: 15 (ref 9–23)
BUN: 14 mg/dL (ref 6–24)
Bilirubin Total: 0.4 mg/dL (ref 0.0–1.2)
CO2: 21 mmol/L (ref 20–29)
CREATININE: 0.92 mg/dL (ref 0.57–1.00)
Calcium: 10.1 mg/dL (ref 8.7–10.2)
Chloride: 102 mmol/L (ref 96–106)
GFR, EST AFRICAN AMERICAN: 79 mL/min/{1.73_m2} (ref >59)
GFR, EST NON AFRICAN AMERICAN: 69 mL/min/{1.73_m2} (ref >59)
GLOBULIN, TOTAL: 3.6 g/dL (ref 1.5–4.5)
GLUCOSE: 83 mg/dL (ref 65–99)
Potassium: 4.1 mmol/L (ref 3.5–5.2)
SODIUM: 143 mmol/L (ref 134–144)
TOTAL PROTEIN: 8.5 g/dL (ref 6.0–8.5)

## 2017-03-12 ENCOUNTER — Ambulatory Visit
Admission: RE | Admit: 2017-03-12 | Discharge: 2017-03-12 | Disposition: A | Discharge status: Home / Self Care | Payer: Medicare Other | Source: Ambulatory Visit | Attending: Neurology | Admitting: Neurology

## 2017-03-12 DIAGNOSIS — G35 Multiple sclerosis: Secondary | ICD-10-CM

## 2017-03-12 MED ORDER — GADOBENATE DIMEGLUMINE 529 MG/ML IV SOLN
13.0000 mL | Freq: Once | INTRAVENOUS | Status: AC | PRN
  Administered 2017-03-12: 13 mL via INTRAVENOUS

## 2017-03-13 ENCOUNTER — Telehealth: Payer: Self-pay | Admitting: Neurology

## 2017-03-13 NOTE — Telephone Encounter (Signed)
    MRI brain 03/13/17:  IMPRESSION:  Abnormal MRI brain (with and without) demonstrating: 1. Multiple supratentorial and infratentorial round and ovoid chronic demyelinating plaques.  2. No abnormal lesions are seen on post contrast views.  3. Compared to MRI on 03/23/15, no significant change.

## 2017-03-13 NOTE — Telephone Encounter (Signed)
I called the patient.  The MRI of the brain appears to be relatively stable from 2017 on the Betaseron.  We discussed the potential for going on Ocrevus, clinically, the patient's ability to walk is not changed much, but if the patient believes that she is having more difficulty with walking, we can switch to a previous any time.  The patient will contact me if she desires to make the switch.

## 2017-04-07 ENCOUNTER — Telehealth: Payer: Self-pay | Admitting: Neurology

## 2017-04-07 NOTE — Telephone Encounter (Addendum)
Called [facility] back. Per CW,MD pt is going to stay on Betaseron for now. Spoke with [place]   They have been unable to reach the patient to schedule her next delivery. I advised them to try and contact her at [phone]. She will try pt at this number. If unable to reach patient she will call our office back. Nothing further needed at this time.

## 2017-04-07 NOTE — Telephone Encounter (Signed)
Terrieann/[facility] [phone] called to advise the pt's last delivery forInterferon Beta-1b (BETASERON) 0.3 MG KIT injection was 02/21/17. Is the pt still on this medication? Please call to advise

## 2017-04-09 ENCOUNTER — Telehealth: Payer: Self-pay | Admitting: Neurology

## 2017-04-09 DIAGNOSIS — Z5181 Encounter for therapeutic drug level monitoring: Secondary | ICD-10-CM

## 2017-04-09 NOTE — Telephone Encounter (Signed)
I called the patient.  The patient is to come in for a recheck of her liver enzymes, they were elevated, the patient is on Betaseron.  I will place the orders.

## 2017-04-17 ENCOUNTER — Other Ambulatory Visit (INDEPENDENT_AMBULATORY_CARE_PROVIDER_SITE_OTHER): Payer: Self-pay

## 2017-04-17 DIAGNOSIS — Z5181 Encounter for therapeutic drug level monitoring: Secondary | ICD-10-CM | POA: Diagnosis not present

## 2017-04-17 DIAGNOSIS — Z0289 Encounter for other administrative examinations: Secondary | ICD-10-CM

## 2017-04-18 LAB — COMPREHENSIVE METABOLIC PANEL
A/G RATIO: 1.4 (ref 1.2–2.2)
ALK PHOS: 101 IU/L (ref 39–117)
ALT: 36 IU/L — ABNORMAL HIGH (ref 0–32)
AST: 28 IU/L (ref 0–40)
Albumin: 4.5 g/dL (ref 3.5–5.5)
BUN/Creatinine Ratio: 13 (ref 9–23)
BUN: 12 mg/dL (ref 6–24)
Bilirubin Total: 0.2 mg/dL (ref 0.0–1.2)
CO2: 23 mmol/L (ref 20–29)
Calcium: 9.4 mg/dL (ref 8.7–10.2)
Chloride: 103 mmol/L (ref 96–106)
Creatinine, Ser: 0.96 mg/dL (ref 0.57–1.00)
GFR calc Af Amer: 75 mL/min/{1.73_m2} (ref >59)
GFR calc non Af Amer: 65 mL/min/{1.73_m2} (ref >59)
GLOBULIN, TOTAL: 3.2 g/dL (ref 1.5–4.5)
Glucose: 65 mg/dL (ref 65–99)
POTASSIUM: 4.5 mmol/L (ref 3.5–5.2)
SODIUM: 141 mmol/L (ref 134–144)
Total Protein: 7.7 g/dL (ref 6.0–8.5)

## 2017-08-28 ENCOUNTER — Ambulatory Visit (INDEPENDENT_AMBULATORY_CARE_PROVIDER_SITE_OTHER): Payer: Medicare Other | Admitting: Adult Health

## 2017-08-28 ENCOUNTER — Encounter: Payer: Self-pay | Admitting: Adult Health

## 2017-08-28 VITALS — BP 122/82 | HR 58 | Ht 60.0 in | Wt 149.0 lb

## 2017-08-28 DIAGNOSIS — G35 Multiple sclerosis: Secondary | ICD-10-CM

## 2017-08-28 DIAGNOSIS — R269 Unspecified abnormalities of gait and mobility: Secondary | ICD-10-CM

## 2017-08-28 DIAGNOSIS — M21372 Foot drop, left foot: Secondary | ICD-10-CM

## 2017-08-28 DIAGNOSIS — Z5181 Encounter for therapeutic drug level monitoring: Secondary | ICD-10-CM

## 2017-08-28 NOTE — Progress Notes (Signed)
I have read the note, and I agree with the clinical assessment and plan.  Charles K Willis   

## 2017-08-28 NOTE — Progress Notes (Signed)
PATIENT: Kristen Boyle DOB: [birth year]  REASON FOR VISIT: follow up HISTORY FROM: patient  HISTORY OF PRESENT ILLNESS: Today 08/28/17: Kristen Boyle is a 57 year old female with a history of multiple sclerosis.  She returns today for follow-up.  She remains on Betaseron.  She reports that her gait and balance have remained relatively the same.  She uses a walker.  She states that she is only had one fall.  This is when she first got the walker she fell off the seat.  She denies any new changes with the bowels or bladder.  She reports that she continues to have weakness in the left leg.  She reports that time she has intermittent numbness in the hands.  She reports blurry vision and decreased vision in the left periphery.  She returns today for an evaluation.  HISTORY Kristen Boyle is a 57 year old right-handed black female with a history of multiple sclerosis associated with a gait disorder and some visual impairment with bilateral optic neuritis.  The patient walks with a cane or a walker.  She believes that her ability to ambulate has worsened over the last year.  The patient has not had any recent falls.  She denies any recent changes in bowel or bladder function, she does have some diarrhea alternating with constipation.  She indicates that her bladder works fairly well.  The patient was sent for blood work last summer, she is JC viral antibody positive.  The patient was set up for MRI of the brain but this was never done.  The patient returns to the office today for an evaluation.  REVIEW OF SYSTEMS: Out of a complete 14 system review of symptoms, the patient complains only of the following symptoms, and all other reviewed systems are negative.  ALLERGIES: Allergies  Allergen Reactions  . Gilenya [Fingolimod]     Stomach upset    HOME MEDICATIONS: Outpatient Medications Prior to Visit  Medication Sig Dispense Refill  . Cholecalciferol 1000 units tablet Take 1,000 Units by mouth  daily.    Marland Kitchen ibuprofen (ADVIL,MOTRIN) 200 MG tablet Take 400 mg by mouth every 6 (six) hours as needed (For pain.).    . Interferon Beta-1b (BETASERON) 0.3 MG KIT injection Inject 0.'25mg'$  (49m) sub-q every other day 14 kit 11  . polyethylene glycol (MIRALAX / GLYCOLAX) packet Take 17 g by mouth daily. (Patient taking differently: Take 17 g by mouth daily as needed. ) 14 each 0  . traZODone (DESYREL) 50 MG tablet Take 1 tablet (50 mg total) by mouth at bedtime. 30 tablet 3  . sertraline (ZOLOFT) 50 MG tablet Take 1 tablet (50 mg total) daily by mouth. 30 tablet 2   No facility-administered medications prior to visit.     PAST MEDICAL HISTORY: Past Medical History:  Diagnosis Date  . Abnormality of gait 06/10/2012  . Depression   . Headache   . Hyperlipidemia   . MS (multiple sclerosis) ([place]     PAST SURGICAL HISTORY: Past Surgical History:  Procedure Laterality Date  . CHOLECYSTECTOMY N/A 06/20/2015   Procedure: LAPAROSCOPIC CHOLECYSTECTOMY;  Surgeon: ARalene Ok MD;  Location: [facility];  Service: General;  Laterality: N/A;  . DENTAL SURGERY    . TUBAL LIGATION Bilateral     FAMILY HISTORY: Family History  Problem Relation Age of Onset  . Alcoholism Father   . Diabetes Paternal Aunt   . Dementia Paternal Grandmother        Senile dementia of Alzheimer's type  . Colon  cancer Cousin   . Colon cancer Maternal Aunt     SOCIAL HISTORY: Social History   Socioeconomic History  . Marital status: Single    Spouse name: Not on file  . Number of children: 4  . Years of education: 2-College  . Highest education level: Not on file  Occupational History    Comment: Disabled  Social Needs  . Financial resource strain: Not on file  . Food insecurity:    Worry: Not on file    Inability: Not on file  . Transportation needs:    Medical: Not on file    Non-medical: Not on file  Tobacco Use  . Smoking status: Never Smoker  . Smokeless tobacco: Never Used  Substance and Sexual  Activity  . Alcohol use: No    Alcohol/week: 0.0 standard drinks  . Drug use: No  . Sexual activity: Never  Lifestyle  . Physical activity:    Days per week: Not on file    Minutes per session: Not on file  . Stress: Not on file  Relationships  . Social connections:    Talks on phone: Not on file    Gets together: Not on file    Attends religious service: Not on file    Active member of club or organization: Not on file    Attends meetings of clubs or organizations: Not on file    Relationship status: Not on file  . Intimate partner violence:    Fear of current or ex partner: Not on file    Emotionally abused: Not on file    Physically abused: Not on file    Forced sexual activity: Not on file  Other Topics Concern  . Not on file  Social History Narrative   Patient lives at home with her daughter.   Disabled.   Education two years of college.   Right handed.   Caffeine one cup of coffee not daily.      PHYSICAL EXAM  Vitals:   08/28/17 1312  BP: 122/82  Pulse: (!) 58  Weight: 149 lb (67.6 kg)  Height: 5' (1.524 m)   Body mass index is 29.1 kg/m.  Generalized: Well developed, in no acute distress   Neurological examination  Mentation: Alert oriented to time, place, history taking. Follows all commands speech and language fluent Cranial nerve II-XII: Pupils were equal round reactive to light. Extraocular movements were full, visual field were full on confrontational test. Facial sensation and strength were normal. Uvula tongue midline. Head turning and shoulder shrug  were normal and symmetric. Motor: The motor testing reveals 5 over 5 strength in all extremities except the left lower extremity is 2/5.  Left foot drop noted.  Good symmetric motor tone is noted throughout.  Sensory: Sensory testing is intact to soft touch on all 4 extremities. No evidence of extinction is noted.  Coordination: Cerebellar testing reveals good finger-nose-finger and heel-to-shin  bilaterally.  Gait and station: Patient uses a walker when ambulating.  Tandem gait not attempted. Reflexes: Deep tendon reflexes are symmetric and normal bilaterally.   DIAGNOSTIC DATA (LABS, IMAGING, TESTING) - I reviewed patient records, labs, notes, testing and imaging myself where available.  Lab Results  Component Value Date   WBC 5.1 02/25/2017   HGB 12.8 02/25/2017   HCT 41.2 02/25/2017   MCV 80 02/25/2017   PLT 284 02/25/2017      Component Value Date/Time   NA 141 04/17/2017 1036   K 4.5 04/17/2017 1036  CL 103 04/17/2017 1036   CO2 23 04/17/2017 1036   GLUCOSE 65 04/17/2017 1036   GLUCOSE 93 04/16/2015 2254   BUN 12 04/17/2017 1036   CREATININE 0.96 04/17/2017 1036   CREATININE 0.89 09/09/2013 1115   CALCIUM 9.4 04/17/2017 1036   PROT 7.7 04/17/2017 1036   ALBUMIN 4.5 04/17/2017 1036   AST 28 04/17/2017 1036   ALT 36 (H) 04/17/2017 1036   ALKPHOS 101 04/17/2017 1036   BILITOT 0.2 04/17/2017 1036   GFRNONAA 65 04/17/2017 1036   GFRNONAA 73 09/09/2013 1115   GFRAA 75 04/17/2017 1036   GFRAA 84 09/09/2013 1115   Lab Results  Component Value Date   CHOL 230 (H) 04/12/2016   HDL 75 04/12/2016   LDLCALC 119 (H) 04/12/2016   TRIG 179 (H) 04/12/2016   CHOLHDL 3.1 04/12/2016   Lab Results  Component Value Date   HGBA1C 5.4 09/17/2010   No results found for: VITAMINB12 Lab Results  Component Value Date   TSH 2.410 11/01/2015      ASSESSMENT AND PLAN 57 y.o. year old female  has a past medical history of Abnormality of gait (06/10/2012), Depression, Headache, Hyperlipidemia, and MS (multiple sclerosis) ([place]). here with :  1.  Multiple sclerosis 2.  Abnormality of gait 3.  Left foot drop  The patient will continue on Betaseron.  Her last MRI in February was stable.  I will refer her to physical therapy for gait and balance training as well as evaluation for AFO brace for left foot drop.  I will check blood work today.  If her symptoms worsen or she  develops new symptoms she should let us know.  She will follow-up in 6 months or sooner if needed.     Ward Givens, MSN, NP-C 08/28/2017, 1:32 PM [facility] [address], [place], [place] [address] [phone]

## 2017-08-28 NOTE — Patient Instructions (Signed)
Your Plan:  Continue Betaseron Blood work today Referral to PT  If your symptoms worsen or you develop new symptoms please let us know.   Thank you for coming to see [place] at [facility]. I hope we have been able to provide you high quality care today.  You may receive a patient satisfaction survey over the next few weeks. We would appreciate your feedback and comments so that we may continue to improve ourselves and the health of our patients.

## 2017-08-29 LAB — CBC WITH DIFFERENTIAL/PLATELET
BASOS ABS: 0 10*3/uL (ref 0.0–0.2)
Basos: 1 %
EOS (ABSOLUTE): 0.2 10*3/uL (ref 0.0–0.4)
Eos: 5 %
Hematocrit: 40.6 % (ref 34.0–46.6)
Hemoglobin: 12.7 g/dL (ref 11.1–15.9)
IMMATURE GRANS (ABS): 0 10*3/uL (ref 0.0–0.1)
IMMATURE GRANULOCYTES: 0 %
LYMPHS: 70 %
Lymphocytes Absolute: 3.4 10*3/uL — ABNORMAL HIGH (ref 0.7–3.1)
MCH: 24.1 pg — ABNORMAL LOW (ref 26.6–33.0)
MCHC: 31.3 g/dL — ABNORMAL LOW (ref 31.5–35.7)
MCV: 77 fL — AB (ref 79–97)
MONOCYTES: 6 %
Monocytes Absolute: 0.3 10*3/uL (ref 0.1–0.9)
NEUTROS ABS: 0.9 10*3/uL — AB (ref 1.4–7.0)
NEUTROS PCT: 18 %
PLATELETS: 250 10*3/uL (ref 150–450)
RBC: 5.26 x10E6/uL (ref 3.77–5.28)
RDW: 14.9 % (ref 12.3–15.4)
WBC: 4.9 10*3/uL (ref 3.4–10.8)

## 2017-08-29 LAB — COMPREHENSIVE METABOLIC PANEL
ALT: 29 IU/L (ref 0–32)
AST: 36 IU/L (ref 0–40)
Albumin/Globulin Ratio: 1.5 (ref 1.2–2.2)
Albumin: 4.7 g/dL (ref 3.5–5.5)
Alkaline Phosphatase: 92 IU/L (ref 39–117)
BILIRUBIN TOTAL: 0.3 mg/dL (ref 0.0–1.2)
BUN/Creatinine Ratio: 11 (ref 9–23)
BUN: 11 mg/dL (ref 6–24)
CALCIUM: 9.8 mg/dL (ref 8.7–10.2)
CHLORIDE: 104 mmol/L (ref 96–106)
CO2: 23 mmol/L (ref 20–29)
Creatinine, Ser: 1 mg/dL (ref 0.57–1.00)
GFR calc non Af Amer: 62 mL/min/{1.73_m2} (ref >59)
GFR, EST AFRICAN AMERICAN: 71 mL/min/{1.73_m2} (ref >59)
GLUCOSE: 75 mg/dL (ref 65–99)
Globulin, Total: 3.2 g/dL (ref 1.5–4.5)
Potassium: 4.2 mmol/L (ref 3.5–5.2)
Sodium: 143 mmol/L (ref 134–144)
TOTAL PROTEIN: 7.9 g/dL (ref 6.0–8.5)

## 2017-09-01 ENCOUNTER — Telehealth: Payer: Self-pay

## 2017-09-01 ENCOUNTER — Other Ambulatory Visit: Payer: Self-pay | Admitting: Neurology

## 2017-09-01 NOTE — Telephone Encounter (Signed)
-----   Message from Butch Penny, NP sent at 09/01/2017  3:48 PM EDT ----- Blood work is relatively unremarkable.  We will continue to monitor.  Please call patient with results.

## 2017-09-01 NOTE — Telephone Encounter (Signed)
Attempted to reach patient at number on file. I was connected with her VM, DPR (checked unable to leave VM). Requested a call back from the patient to discuss when able.

## 2017-09-02 NOTE — Telephone Encounter (Signed)
I contacted the patient and advised of results from NP. Ms Noorda verbalized understanding. She did want to verify if the PT referral had been placed. I advised the referral was placed on 08/28/2017 and had been sent via epic to [facility].I advised she should be receiving a call from Neuro Rehab to discuss setting an evaluation up. Patient verbalized understanding and had no further questions at this time. MB RN.

## 2017-12-22 IMAGING — DX DG CHEST 2V
2 series · 2 of 2 positions shown · non-contrast
Comparison: None.

CLINICAL DATA: 54-year-old female with left-sided back pain

EXAM:
CHEST  2 VIEW

[chest lat]
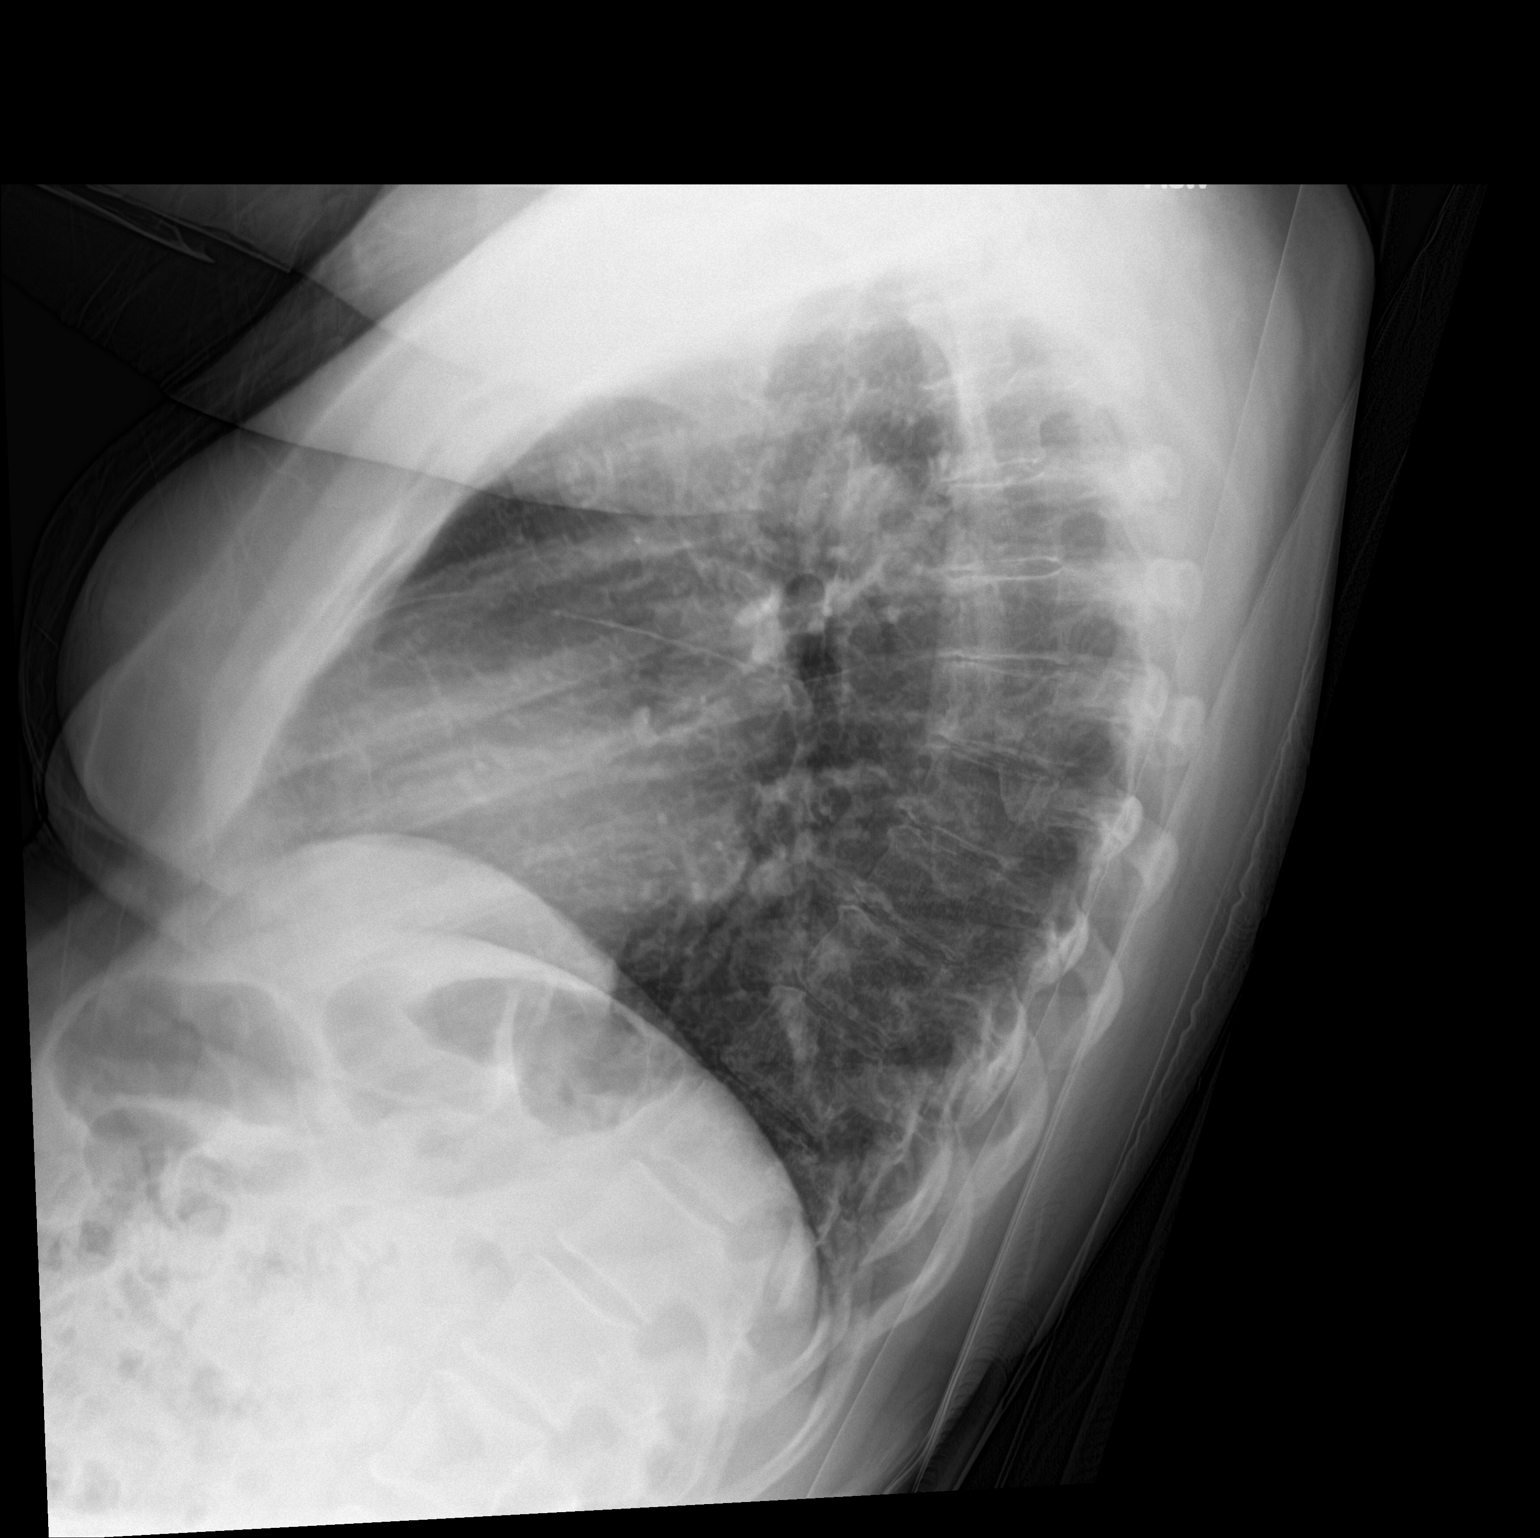

[chest ap]
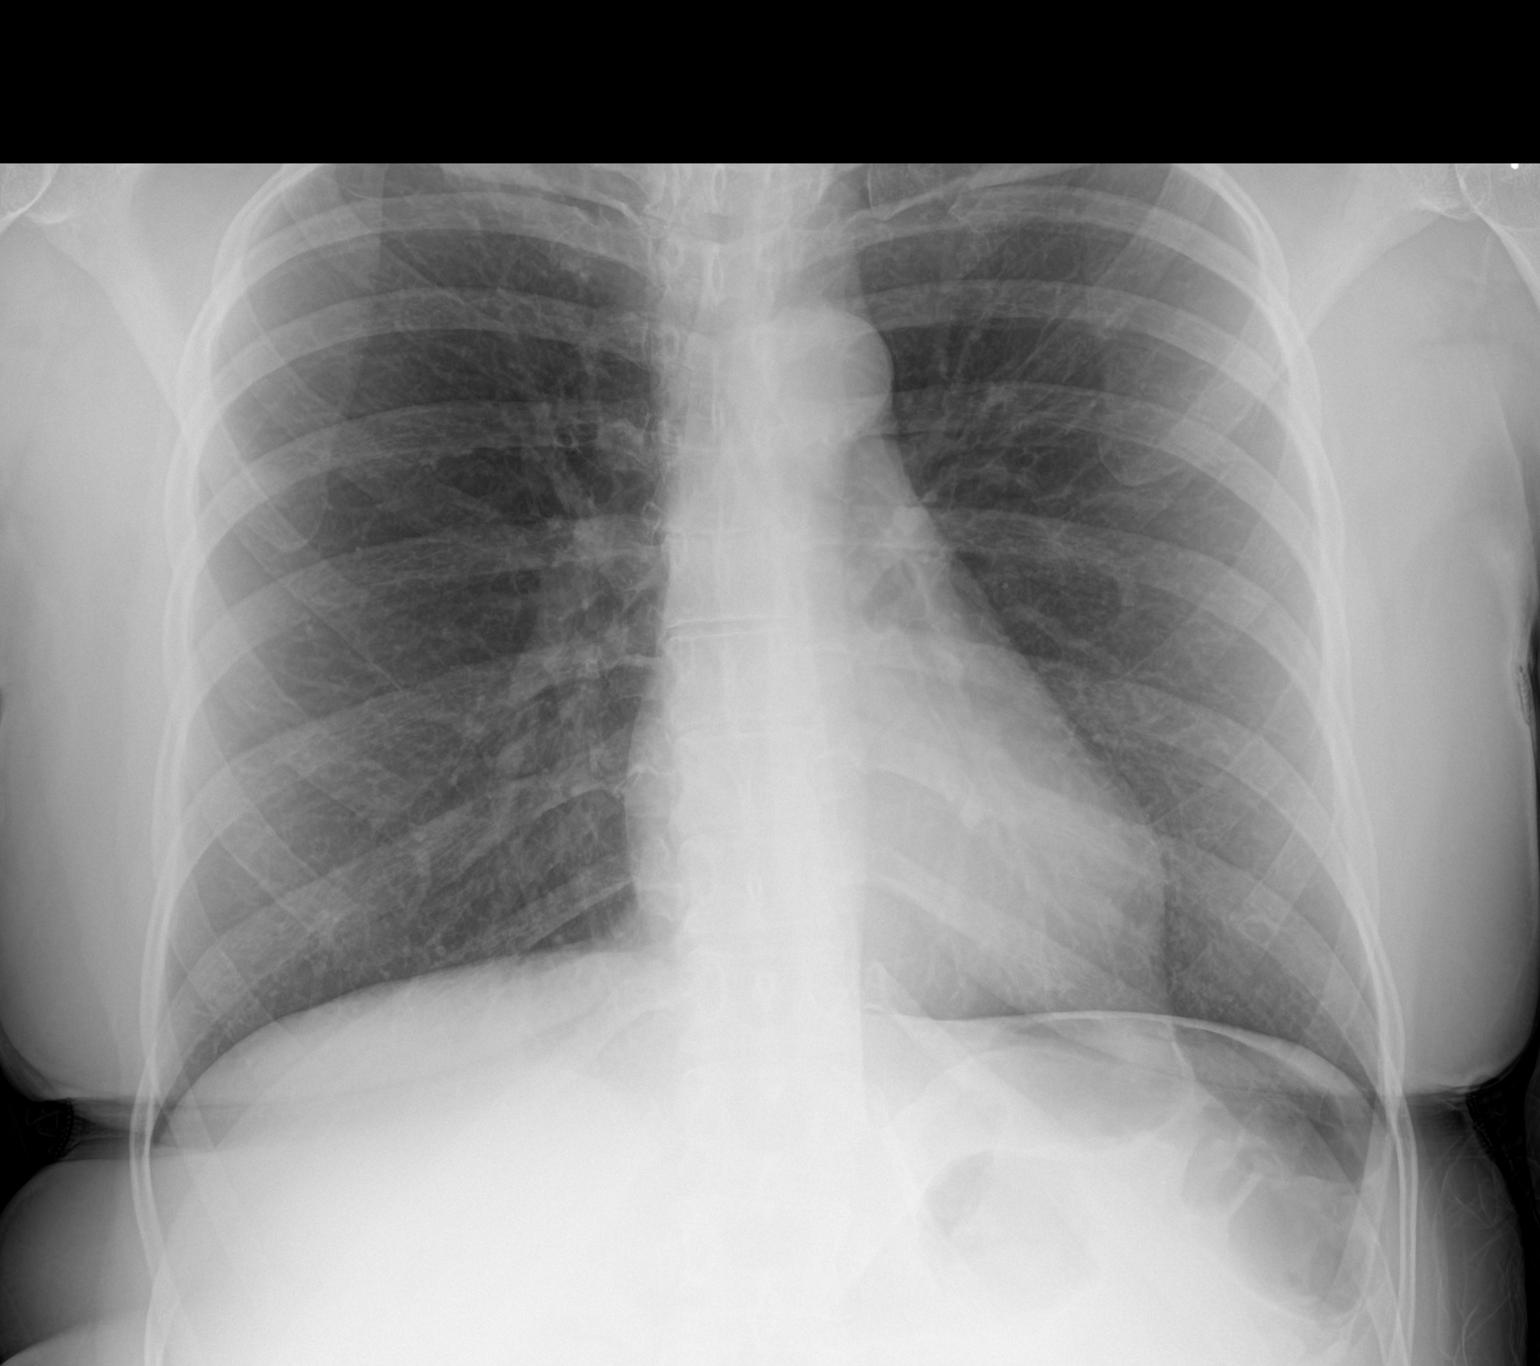

[2 of 2 positions shown; findings below may reference images not displayed]

FINDINGS: The heart size and mediastinal contours are within normal limits.
Both lungs are clear. The visualized skeletal structures are
unremarkable.
IMPRESSION: No active cardiopulmonary disease.

## 2018-06-01 ENCOUNTER — Telehealth: Payer: Medicare Other | Admitting: Physician Assistant

## 2018-06-01 ENCOUNTER — Encounter: Payer: Self-pay | Admitting: Physician Assistant

## 2018-06-01 DIAGNOSIS — R110 Nausea: Secondary | ICD-10-CM

## 2018-06-01 DIAGNOSIS — R109 Unspecified abdominal pain: Secondary | ICD-10-CM

## 2018-06-01 DIAGNOSIS — R194 Change in bowel habit: Secondary | ICD-10-CM

## 2018-06-01 NOTE — Progress Notes (Signed)
Based on what you shared with me, I feel your condition warrants further evaluation and I recommend that you be seen for a face to face office visit.  [name],  You symptoms of abdominal pian and lack of bowel movement in 2 days warrant face to face evaluation.     NOTE: If you entered your credit card information for this eVisit, you will not be charged. You may see a "hold" on your card for the $35 but that hold will drop off and you will not have a charge processed.  If you are having a true medical emergency please call 911.  If you need an urgent face to face visit, [facility] has four urgent care centers for your convenience.    PLEASE NOTE: THE [facility] LOCATIONS AND URGENT CARE CLINICS DO NOT HAVE THE TESTING FOR CORONAVIRUS COVID19 AVAILABLE.  IF YOU FEEL YOU NEED THIS TEST YOU MUST GO TO A TRIAGE LOCATION AT ONE OF THE HOSPITAL EMERGENCY DEPARTMENTS   [email or web address] to reserve your spot online an avoid wait times  [facility] [address] [place], [place] [address] Modified hours of operation: Monday-Friday, 12 PM to 6 PM  Saturday & Sunday 10 AM to 4 PM *Across the street from [employer]  [facility] [employer] (New Address!) [address] [place], [place] [address] *Just off [employer], across the road from [place]* Modified hours of operation: Monday-Friday, 12 PM to 6 PM  Closed Saturday & Sunday  [facility]'s modified hours of operation will be in effect from May 1 until May 31   The following sites will take your insurance:  . [facility]  [phone] Get Driving Directions Find a Provider at this Location  [address] [place], [place] [address] . 10 am to 8 pm Monday-Friday . 12 pm to 8 pm Saturday-Sunday   . [facility] at [facility]  [phone] Get Driving Directions Find a Provider at this Location  [address]  [place], [place] [address] . 8 am to 8 pm Monday-Friday . 9 am to 6 pm Saturday . 11 am to 6 pm Sunday   . [facility] at [facility]  [phone] Get Driving Directions  [address] [place], [place] [address] . 8 am to 8 pm Monday-Friday . 8 am to 4 pm Saturday-Sunday   Your e-visit answers were reviewed by a board certified advanced clinical practitioner to complete your personal care plan.  Thank you for using e-Visits.   I have spent 7 min in completion and review of this note- [facility]

## 2018-06-04 ENCOUNTER — Ambulatory Visit (INDEPENDENT_AMBULATORY_CARE_PROVIDER_SITE_OTHER)
Admission: RE | Admit: 2018-06-04 | Discharge: 2018-06-04 | Disposition: A | Discharge status: Home / Self Care | Payer: Medicare Other | Source: Ambulatory Visit

## 2018-06-04 ENCOUNTER — Telehealth: Payer: Medicare Other

## 2018-06-04 DIAGNOSIS — K5900 Constipation, unspecified: Secondary | ICD-10-CM

## 2018-06-04 NOTE — Discharge Instructions (Signed)
Based on what you have told me I believe you are suffering from constipation. Try some MiraLAX for your symptoms.  You can do 2 scoops  a day until you get a good bowel movement. You could even try an enema Magnesium citrate is another option If you do not get any relief of symptoms or your symptoms worsen over the next 2 to 3 days or you start having nausea and vomiting you need to go to the hospital

## 2018-06-04 NOTE — ED Provider Notes (Signed)
.   Virtual Visit via Video Note:  Kristen Boyle  initiated request for Telemedicine visit with [facility] team. I connected with Kristen Boyle  on 06/04/2018 at 2:14 PM  for a synchronized telemedicine visit using a video enabled HIPPA compliant telemedicine application. I verified that I am speaking with Kristen Boyle  using two identifiers. Janace Aris, NP  was physically located in a [facility] Urgent care site and KAYLANY BICKLER was located at a different location.   The limitations of evaluation and management by telemedicine as well as the availability of in-person appointments were discussed. Patient was informed that she  may incur a bill ( including co-pay) for this virtual visit encounter. Kristen Boyle  expressed understanding and gave verbal consent to proceed with virtual visit.     History of Present Illness:Kristen Boyle  is a 58 y.o. female presents with 4 days of generalized abdominal discomfort and constipation. She has been using prune juice, fruit, and flaxseed to try to help with the problem. Symptoms have been constant, waxing and waning. Denies any associated N,V,D. No fever. No urinary symptoms. No chest pain, SOB. She has had recent change in diet which could be contributing to her symptoms.    Past Medical History:  Diagnosis Date  . Abnormality of gait 06/10/2012  . Depression   . Headache   . Hyperlipidemia   . MS (multiple sclerosis) (HCC)     Allergies  Allergen Reactions  . Gilenya [Fingolimod]     Stomach upset        Observations/Objective:  GENERAL APPEARANCE: Well developed, well nourished, alert and cooperative, and appears to be in no acute distress. HEAD: normocephalic. Non labored breathing, no dyspnea or distress Skin: Skin normal color  PSYCHIATRIC: The mental examination revealed the patient was oriented to person, place, and time. The patient was able to demonstrate good judgement and reason,  without hallucinations, abnormal affect or abnormal behaviors during the examination. Patient is not suicidal     Assessment and Plan: symptoms consistent with constipation. She is not telling me any other concerning signs or symptoms.  We will try miralax for symptoms. recommended that if symptoms continue she couls try magnesium citrate or enema. If non of this helps she will have to go to the ER.   Follow Up Instructions: Follow up as needed for continued or worsening symptoms     I discussed the assessment and treatment plan with the patient. The patient was provided an opportunity to ask questions and all were answered. The patient agreed with the plan and demonstrated an understanding of the instructions.   The patient was advised to call back or seek an in-person evaluation if the symptoms worsen or if the condition fails to improve as anticipated.     Janace Aris, NP  06/04/2018 2:14 PM         Janace Aris, NP 06/06/18 1134

## 2018-08-31 ENCOUNTER — Telehealth: Payer: Self-pay | Admitting: Adult Health

## 2019-01-12 ENCOUNTER — Ambulatory Visit: Payer: Medicare Other | Attending: Internal Medicine

## 2019-01-12 DIAGNOSIS — Z20822 Contact with and (suspected) exposure to covid-19: Secondary | ICD-10-CM

## 2019-01-14 LAB — NOVEL CORONAVIRUS, NAA: SARS-CoV-2, NAA: DETECTED — AB

## 2019-01-16 ENCOUNTER — Encounter: Payer: Self-pay | Admitting: Infectious Diseases

## 2019-01-16 NOTE — Progress Notes (Signed)
Patient does not meet criteria for monoclonal antibody treatment for COVID+ outpatients due to lack of risk factors for severe disease.  

## 2019-01-22 ENCOUNTER — Encounter: Payer: Self-pay | Admitting: Internal Medicine

## 2019-01-27 ENCOUNTER — Other Ambulatory Visit: Payer: Self-pay

## 2019-01-27 DIAGNOSIS — Z20822 Contact with and (suspected) exposure to covid-19: Secondary | ICD-10-CM

## 2019-01-28 LAB — NOVEL CORONAVIRUS, NAA: SARS-CoV-2, NAA: DETECTED — AB

## 2019-01-29 ENCOUNTER — Telehealth: Payer: Self-pay | Admitting: Physician Assistant

## 2019-01-29 NOTE — Telephone Encounter (Signed)
Patient was contacted regarding positive covid 19 result. She had a positive result on 01/12/19 and has been on strict quarantine since that time. She has been asymptomatic. She was re-tested on 01/27/19 to see if she still had the virus. I explained that we do not recommend retesting for 90 days from the first positive test and that based on her lack of symptoms and first positive test, she has completed her quarantine but needs to continue wearing masks and social distancing.     Cline Crock PA-C  [facility]

## 2019-07-29 NOTE — Telephone Encounter (Signed)
Error

## 2019-10-21 ENCOUNTER — Encounter: Payer: Self-pay | Admitting: Internal Medicine

## 2019-12-01 ENCOUNTER — Other Ambulatory Visit: Payer: Self-pay

## 2019-12-01 ENCOUNTER — Ambulatory Visit (INDEPENDENT_AMBULATORY_CARE_PROVIDER_SITE_OTHER): Payer: Medicare Other | Admitting: Student

## 2019-12-01 ENCOUNTER — Encounter: Payer: Self-pay | Admitting: Student

## 2019-12-01 VITALS — BP 134/91 | HR 64 | Wt 150.1 lb

## 2019-12-01 DIAGNOSIS — R1013 Epigastric pain: Secondary | ICD-10-CM | POA: Diagnosis not present

## 2019-12-01 DIAGNOSIS — E782 Mixed hyperlipidemia: Secondary | ICD-10-CM

## 2019-12-01 DIAGNOSIS — E559 Vitamin D deficiency, unspecified: Secondary | ICD-10-CM

## 2019-12-01 DIAGNOSIS — F321 Major depressive disorder, single episode, moderate: Secondary | ICD-10-CM | POA: Diagnosis not present

## 2019-12-01 DIAGNOSIS — R600 Localized edema: Secondary | ICD-10-CM | POA: Diagnosis not present

## 2019-12-01 DIAGNOSIS — G35 Multiple sclerosis: Secondary | ICD-10-CM

## 2019-12-01 DIAGNOSIS — Z0000 Encounter for general adult medical examination without abnormal findings: Secondary | ICD-10-CM | POA: Diagnosis not present

## 2019-12-01 DIAGNOSIS — R0600 Dyspnea, unspecified: Secondary | ICD-10-CM | POA: Diagnosis not present

## 2019-12-01 DIAGNOSIS — N951 Menopausal and female climacteric states: Secondary | ICD-10-CM | POA: Diagnosis not present

## 2019-12-01 DIAGNOSIS — R0609 Other forms of dyspnea: Secondary | ICD-10-CM

## 2019-12-01 DIAGNOSIS — G35D Multiple sclerosis, unspecified: Secondary | ICD-10-CM

## 2019-12-01 LAB — BRAIN NATRIURETIC PEPTIDE: B Natriuretic Peptide: 31.3 pg/mL (ref 0.0–100.0)

## 2019-12-01 MED ORDER — CITALOPRAM HYDROBROMIDE 10 MG PO TABS: 10.0000 mg | ORAL_TABLET | Freq: Every day | ORAL | 0 refills | Status: DC

## 2019-12-01 NOTE — Assessment & Plan Note (Signed)
Patient has had longstanding bilateral lower extremity edema which is worse at the end of the day and better in the morning.  Over the past year has also had moderate dyspnea on exertion.  No known cardiac history.  No smoking, diabetes, hypertension.  Does have high cholesterol.  Denies any chest pain.  -BNP -Consider TTE pending BNP results

## 2019-12-01 NOTE — Patient Instructions (Signed)
Thank you for allowing [place] to be a part of your care today, it was a pleasure seeing you. We discussed your multiple sclerosis, hot flashes, hyperlipidemia, depression, vitamin D deficiency, shortness of breath with exertion, preventive health  I am checking these labs: BNP, CMP, CMP, lipid panel, vitamin D, A1c  For your multiple sclerosis, I am referring you back to your prior neurology office to start Ocrevus to her mother disease modifying agents and also referring you to physical therapy.    For hot flashes, I am starting you on citalopram 10 mg daily.  Follow-up on this in 2 weeks, and increase dose if tolerating well.  I am referring you to our behavioral health specialist for counseling to deal with the recent loss.  And restarted her about that.  The medication that we started for your hot flashes may also help with this.  I am checking your vitamin D levels, cholesterol, and blood sugar.  Please continue your vitamin D.  Please shortness of breath, I am checking a lab for signs of congestive heart failure.  I will call you with the results, and possibly send you for an ultrasound of the heart.  For a preventive health, I highly recommend that she receive a mammogram and a Pap smear.  We can conduct Pap smear to next visit.  We will also try to get the records of your prior colonoscopy.  I have made these changes to your medications: Start citalopram 10 mg daily  Go to the [facility] or visit their website to fill out the form to request handicap parking. You can bring it to [place] to complete the physician portion. I am also referring you to our social workers to see about installing rails etc., and also about the power wheelchair.  Please fill out a release of information form for [place] to request your colonoscopy records.   Please follow up in 2 weeks to reassess your depression, hot flashes, abdominal pain, and headache.   Thank you, and please call the Internal Medicine Clinic at  [phone] if you have any questions.  Best, Dr. Imogene Burn

## 2019-12-01 NOTE — Assessment & Plan Note (Addendum)
-  Mammogram: Patient had a bad experience in 2017, declines further mammograms at this time -Colonoscopy: Reportedly had one around 2016, will request her records from Dr. Elnoria Howard -Influenza vaccine: Declines -Pap smear: Yearly Pap smears recommended due to Betaseron therapy, last in 2017 and benign.  Offered today, but she prefers female provider and would like to defer until next visit.  Addendum: records received from Dr. Haywood Pao practice indicate she has not been seen since 2013. Those records do not clearly indicate when/if a colonoscopy was done. Offer colon CA screening at next visit.

## 2019-12-01 NOTE — Assessment & Plan Note (Addendum)
Concurrent multiple sclerosis which would benefit from vitamin D supplementation.  Last level in 2015 was 25.  She has continued vitamin D supplementation.  -Check vitamin D level -Continue vitamin D replacement 1000 units daily  Addendum: Vitamin D level 26.7.  She is unsure what dose vitamin D she has been taking.  Prescribed 1000 units daily

## 2019-12-01 NOTE — Progress Notes (Signed)
   CC: Multiple sclerosis, hot flashes, hyperlipidemia, depression, vitamin D deficiency, shortness of breath with exertion  HPI:  [name] is a 59 y.o. female with history as below presenting for follow-up on the above, last office visit with [place] was 3 years ago. Please refer to problem based charting for further details of assessment and plan of current problem and chronic medical conditions.  Aside from those addressed in the problem-based charting, she also complained of abdominal pain and headache today. We ran out of time after addressing multiple issues today so will address these at her follow up in about 2 weeks. I did order some labs for the abdominal pain.  Past Medical History:  Diagnosis Date  . Abnormality of gait 06/10/2012  . Depression   . Headache   . Hyperlipidemia   . MS (multiple sclerosis) (HCC)    Review of Systems:   Review of Systems  Constitutional: Positive for chills and diaphoresis. Negative for fever and weight loss.  Eyes: Positive for blurred vision (And floaters). Negative for pain.  Respiratory: Positive for shortness of breath (With exertion).   Cardiovascular: Negative for chest pain.  Gastrointestinal: Positive for abdominal pain and diarrhea. Negative for blood in stool, constipation, nausea and vomiting.  Neurological: Positive for focal weakness (Chronic left lower extremity weakness) and headaches. Negative for loss of consciousness.  Psychiatric/Behavioral: Positive for depression. Negative for hallucinations, substance abuse and suicidal ideas. The patient is not nervous/anxious and does not have insomnia.   All other systems reviewed and are negative.    Physical Exam: Vitals:   12/01/19 1428  BP: (!) 134/91  Pulse: 64  SpO2: 100%  Weight: 150 lb 1.6 oz (68.1 kg)   Constitutional: no acute distress Head: atraumatic ENT: external ears normal Cardiovascular: regular rate and rhythm, normal heart sounds, 1+ pitting bilateral  lower extremity edema Pulmonary: effort normal, normal breath sounds bilaterally Abdominal: flat, nontender, no rebound tenderness, bowel sounds normal Skin: warm and dry Neurological: alert, cranial nerves intact except saccadic eye movements when tracking, sensation decreased over entire left lower extremity but otherwise intact, bilateral upper extremities with 4/5 strength and symmetric, right lower extremity 4/5 strength, left lower extremity with 0/5 strength as far as I can tell.  No speech difficulty Psychiatric: Tearful when discussing her recently deceased son  Assessment & Plan:   See Encounters Tab for problem based charting.  Patient seen with Dr. Mikey Bussing

## 2019-12-01 NOTE — Assessment & Plan Note (Signed)
Patient states her chronic depression was fairly well controlled until her 61 year old son died in 11/16/19.  Current symptoms due to adjustment disorder versus major depressive disorder.  Denies SI, HI, hallucinations.  She became tearful during exam.  -Referral to integrative health for therapy -Start citalopram 10 mg for concurrence hot flashes, follow-up in 2 weeks to increase dose of tolerating

## 2019-12-01 NOTE — Assessment & Plan Note (Signed)
Reports experiencing hot flashes 4-5 times per day lasting about 5 minutes at a time since start of menopause around age 59.  These have not really improved over time.  Notes that her MS symptoms seem to be worse when she has these hot flashes.  Last mammogram was 2017 without evidence of cancer, but she declines further mammograms due to bad experience at that time.  -Hormonal therapy contraindicated due to lack of breast cancer screening -Start citalopram 10 mg, follow-up in 2 weeks to reassess an increased dose if tolerating -Discussed behavioral modifications for symptomatic management

## 2019-12-01 NOTE — Assessment & Plan Note (Addendum)
Last cholesterol panel in 2018 with elevated LDL, but does not qualify for statin at that time.  No current medications.  -Check lipid panel  Addendum: LDL increased to 153. 10-year ASCVD risk of 6.4% Starting a moderate intensity statin: Crestor 10mg 

## 2019-12-01 NOTE — Assessment & Plan Note (Addendum)
Was last seen by neurology in August 2019.  Prior MRI was in February 2019 and was stable.  Plan that time was to continue Betaseron and physical therapy for gait and balance training.  She has chronic left lower extremity weakness, was previously able to get around with a walker but now requires a wheelchair.  Also has had visual symptoms, had ophtho appointment with bilateral abnormalities on visual evoked potential testing, but states he did not plan to follow-up with her since there was a new primary ophthalmologic issue. Since her last office visit with [place] 3 years ago, she stopped taking her Betaseron 6 months ago and has had progressive worsening of her left lower extremity weakness.  On exam, I am unable to take any movement in that extremity.  Has had worsening of her central visual field as well.  Pupils are PERRL, extraocular movements intact, she seems diffusely weak with four 0/5 strength throughout and 0 out of 5 for all movements in the left lower extremity.  -Continue vitamin D, which she has been taking -refer to her prior neurologist at [facility] to start DMARD -refer to PT  Addendum: Vitamin D level is low, she is unsure what dose she has been taking.  Prescribed 1000 units daily

## 2019-12-02 ENCOUNTER — Telehealth: Payer: Self-pay | Admitting: *Deleted

## 2019-12-02 NOTE — Chronic Care Management (AMB) (Signed)
  Chronic Care Management   Outreach Note  12/02/2019 Name: Kristen Boyle MRN: [id number] DOB: [birth year]  Kristen Boyle is a 59 y.o. year old female who is a primary care patient of Remo Lipps, MD. I reached out to [name] by phone today in response to a referral sent by [name] PCP, Remo Lipps, MD     An unsuccessful telephone outreach was attempted today. The patient was referred to the case management team for assistance with care management and care coordination.   Follow Up Plan: A HIPAA compliant phone message was left for the patient providing contact information and requesting a return call. The care management team will reach out to the patient again over the next 7 days. If patient returns call to provider office, please advise to call Embedded Care Management Care Guide Gwenevere Ghazi at [phone].  Gwenevere Ghazi  Care Guide, Embedded Care Coordination [facility] Management  Direct Dial: [phone]

## 2019-12-03 LAB — CMP14 + ANION GAP
ALT: 15 IU/L (ref 0–32)
AST: 22 IU/L (ref 0–40)
Albumin/Globulin Ratio: 1.5 (ref 1.2–2.2)
Albumin: 4.5 g/dL (ref 3.8–4.8)
Alkaline Phosphatase: 82 IU/L (ref 44–121)
Anion Gap: 13 mmol/L (ref 10.0–18.0)
BUN/Creatinine Ratio: 12 (ref 12–28)
BUN: 9 mg/dL (ref 8–27)
Bilirubin Total: 0.3 mg/dL (ref 0.0–1.2)
CO2: 26 mmol/L (ref 20–29)
Calcium: 9.6 mg/dL (ref 8.7–10.3)
Chloride: 105 mmol/L (ref 96–106)
Creatinine, Ser: 0.77 mg/dL (ref 0.57–1.00)
GFR calc Af Amer: 96 mL/min/{1.73_m2} (ref >59)
GFR calc non Af Amer: 84 mL/min/{1.73_m2} (ref >59)
Globulin, Total: 3.1 g/dL (ref 1.5–4.5)
Glucose: 82 mg/dL (ref 65–99)
Potassium: 4 mmol/L (ref 3.5–5.2)
Sodium: 144 mmol/L (ref 134–144)
Total Protein: 7.6 g/dL (ref 6.0–8.5)

## 2019-12-03 LAB — LIPID PANEL
Chol/HDL Ratio: 3.1 ratio (ref 0.0–4.4)
Cholesterol, Total: 248 mg/dL — ABNORMAL HIGH (ref 100–199)
HDL: 80 mg/dL (ref >39)
LDL Chol Calc (NIH): 153 mg/dL — ABNORMAL HIGH (ref 0–99)
Triglycerides: 89 mg/dL (ref 0–149)
VLDL Cholesterol Cal: 15 mg/dL (ref 5–40)

## 2019-12-03 LAB — CBC
Hematocrit: 39.5 % (ref 34.0–46.6)
Hemoglobin: 12.6 g/dL (ref 11.1–15.9)
MCH: 24.8 pg — ABNORMAL LOW (ref 26.6–33.0)
MCHC: 31.9 g/dL (ref 31.5–35.7)
MCV: 78 fL — ABNORMAL LOW (ref 79–97)
Platelets: 295 10*3/uL (ref 150–450)
RBC: 5.09 x10E6/uL (ref 3.77–5.28)
RDW: 14.7 % (ref 11.7–15.4)
WBC: 3.4 10*3/uL (ref 3.4–10.8)

## 2019-12-03 LAB — VITAMIN D 25 HYDROXY (VIT D DEFICIENCY, FRACTURES): Vit D, 25-Hydroxy: 26.7 ng/mL — ABNORMAL LOW (ref 30.0–100.0)

## 2019-12-03 MED ORDER — ROSUVASTATIN CALCIUM 10 MG PO TABS: 10.0000 mg | ORAL_TABLET | Freq: Every day | ORAL | 5 refills | Status: DC

## 2019-12-03 MED ORDER — CHOLECALCIFEROL 25 MCG (1000 UT) PO TABS
1000.0000 [IU] | ORAL_TABLET | Freq: Every day | ORAL | 1 refills | Status: DC
Start: 2019-12-03 — End: 2020-07-19

## 2019-12-03 NOTE — Addendum Note (Signed)
Addended by: Remo Lipps on: 12/03/2019 01:30 PM   Modules accepted: Orders

## 2019-12-03 NOTE — Addendum Note (Signed)
Addended by: Remo Lipps on: 12/03/2019 03:54 PM   Modules accepted: Orders

## 2019-12-08 ENCOUNTER — Telehealth: Payer: Self-pay | Admitting: Emergency Medicine

## 2019-12-08 NOTE — Telephone Encounter (Signed)
-----   Message from Charles K Willis, MD sent at 12/07/2019  4:30 PM EST ----- This patient is a MS patient of mine and was lost to follow-up, her primary doctor is trying to get her worked in a bit sooner than February, please set her up in a new patient slot sometime in the next several weeks. Thank you.  

## 2019-12-08 NOTE — Telephone Encounter (Signed)
Left message for patient to call office back for scheduling.

## 2019-12-09 ENCOUNTER — Other Ambulatory Visit: Payer: Self-pay

## 2019-12-09 ENCOUNTER — Telehealth: Payer: Self-pay | Admitting: Behavioral Health

## 2019-12-09 ENCOUNTER — Telehealth: Payer: Self-pay | Admitting: Emergency Medicine

## 2019-12-09 ENCOUNTER — Ambulatory Visit: Payer: Medicare Other | Admitting: Behavioral Health

## 2019-12-09 DIAGNOSIS — F321 Major depressive disorder, single episode, moderate: Secondary | ICD-10-CM

## 2019-12-09 NOTE — Telephone Encounter (Signed)
-----   Message from York Spaniel, MD sent at 12/07/2019  4:30 PM EST ----- This patient is a MS patient of mine and was lost to follow-up, her primary doctor is trying to get her worked in a bit sooner than February, please set her up in a new patient slot sometime in the next several weeks. Thank you.

## 2019-12-09 NOTE — Progress Notes (Signed)
Internal Medicine Clinic Attending  I saw and evaluated the patient.  I personally confirmed the key portions of the history and exam documented by Dr. Chen and I reviewed pertinent patient test results.  The assessment, diagnosis, and plan were formulated together and I agree with the documentation in the resident's note.  

## 2019-12-09 NOTE — Telephone Encounter (Signed)
Daughter called back and accepted appointment for 01/25/20 @ 0900.  Both patient an daughter expressed appreciation.

## 2019-12-09 NOTE — Telephone Encounter (Signed)
Attempted to call patient 11/17 & 11/18, left messages for patient to return call office for appointment.

## 2019-12-09 NOTE — Telephone Encounter (Signed)
-----   Message from Charles K Willis, MD sent at 12/07/2019  4:30 PM EST ----- This patient is a MS patient of mine and was lost to follow-up, her primary doctor is trying to get her worked in a bit sooner than February, please set her up in a new patient slot sometime in the next several weeks. Thank you.  

## 2019-12-09 NOTE — Chronic Care Management (AMB) (Signed)
  Chronic Care Management   Outreach Note  12/09/2019 Name: [name] MRN: [id number] DOB: 1960-01-24  Kristen Boyle is a 59 y.o. year old female who is a primary care patient of Remo Lipps, MD. I reached out to [name] by phone today in response to a referral sent by [name] PCP, Remo Lipps, MD.     A second unsuccessful telephone outreach was attempted today. The patient was referred to the case management team for assistance with care management and care coordination.   Follow Up Plan: A HIPAA compliant phone message was left for the patient providing contact information and requesting a return call. The care management team will reach out to the patient again over the next 7-14 days. If patient returns call to provider office, please advise to call Embedded Care Management Care Guide Gwenevere Ghazi at [phone].  Gwenevere Ghazi  Care Guide, Embedded Care Coordination [facility] Management  Direct Dial: [phone]

## 2019-12-13 ENCOUNTER — Encounter: Payer: Self-pay | Admitting: *Deleted

## 2019-12-14 ENCOUNTER — Other Ambulatory Visit: Payer: Self-pay

## 2019-12-14 ENCOUNTER — Ambulatory Visit: Payer: Medicare Other | Admitting: Behavioral Health

## 2019-12-14 DIAGNOSIS — F331 Major depressive disorder, recurrent, moderate: Secondary | ICD-10-CM

## 2019-12-14 NOTE — BH Specialist Note (Signed)
Integrated Behavioral Health Initial In-Person Visit  MRN: [id number] Name: Kristen Boyle  Number of Integrated Behavioral Health Clinician visits:: 1/6: This visit dated 12/09/2019 Session Start time: 2:15p  Session End time: 3:00p Total time: 45  minutes  Types of Service: Individual psychotherapy  Interpretor:No. Interpretor Name and Language: n/a   Warm Hand Off Completed.       Subjective: Kristen Boyle is a 59 y.o. female accompanied by Self; Dtr joined at end of session Patient was referred by PCP for Grief Cslg. Patient reports the following symptoms/concerns: Sadness, anger Duration of problem: Since Son's death in 11-07-19; Severity of problem: moderate  Objective: Mood: Sadness & anger Affect: Depressed Risk of harm to self or others: No plan to harm self or others  Life Context: Family and Social: Pt difficult to reach due to her phone on silent; recognized my call & turned ringer on School/Work: Pt is home bound & does not work currently Self-Care: Pt able to achieve some ADL, but also needs assistance of Dtr Life Changes: Recent death of Oldest Son in November 07, 2019 Patient and/or Family's Strengths/Protective Factors: Close knit Family; Pt lives w/her Dtr & has her own room. She is comfortable. It is, "just like my own Apt."  Goals Addressed: Patient will: 1. Reduce symptoms of: depression and sadness by assisting w/grief processing 2. Increase knowledge and/or ability of: coping skills and adjustment to the death of her Oldest Son  3. Demonstrate ability to: Increase healthy adjustment to current life circumstances and Begin healthy grieving over loss  Progress towards Goals: Ongoing  Interventions: Interventions utilized: [facility] approach  Standardized Assessments completed: Not Needed  Patient and/or Family Response: Dtr & Family are very supportive, other children also helpful & in the midst of grieving  Patient Centered  Plan: Patient is on the following Treatment Plan(s):  Assist Pt to process grief rxn  Assessment: Patient currently experiencing deep sense of loss.   Patient may benefit from validation of feelings/emotions & normalizing individual grief rxn.  Plan: 1. Follow up with behavioral health clinician on : One week 2. Behavioral recommendations: Try to move back to baseline slowly  3. Referral(s): Integrated [employer] (In Clinic) and support for Individual process of grieving 4. "From scale of 1-10, how likely are you to follow plan?": 5  Deneise Lever, LMFT

## 2019-12-20 NOTE — Chronic Care Management (AMB) (Signed)
  Chronic Care Management   Outreach Note  12/20/2019 Name: Kristen Boyle MRN: [id number] DOB: 1960-08-01  Kristen Boyle is a 59 y.o. year old female who is a primary care patient of Remo Lipps, MD. I reached out to [name] by phone today in response to a referral sent by [name] PCP, Remo Lipps, MD.     Third unsuccessful telephone outreach was attempted today. The patient was referred to the case management team for assistance with care management and care coordination. The patient's primary care provider has been notified of our unsuccessful attempts to make or maintain contact with the patient. The care management team is pleased to engage with this patient at any time in the future should he/she be interested in assistance from the care management team.   Follow Up Plan: We have been unable to make contact with the patient. The care management team is available to follow up with the patient after provider conversation with the patient regarding recommendation for care management engagement and subsequent re-referral to the care management team. A HIPAA compliant phone message was left for the patient providing contact information and requesting a return call. If patient returns call to provider office, please advise to call Embedded Care Management Care Guide Gwenevere Ghazi at [phone].  Gwenevere Ghazi  Care Guide, Embedded Care Coordination [facility] Management  Direct Dial: [phone]

## 2019-12-21 ENCOUNTER — Other Ambulatory Visit: Payer: Self-pay

## 2019-12-21 ENCOUNTER — Ambulatory Visit: Payer: Medicare Other | Admitting: Behavioral Health

## 2019-12-21 DIAGNOSIS — F331 Major depressive disorder, recurrent, moderate: Secondary | ICD-10-CM

## 2019-12-21 NOTE — BH Specialist Note (Signed)
Integrated Behavioral Health Follow Up In-Person Visit  MRN: [id number] Name: Kristen Boyle  Number of Integrated Behavioral Health Clinician visits: 3/6 Session Start time: 3:00pm  Session End time: 3:45pm Total time: 45  minutes  Types of Service: General Behavioral Integrated Care (BHI) and grief cslg  Interpretor:No. Interpretor Name and Language: n/a  Subjective: Kristen Boyle is a 59 y.o. female accompanied by Self Patient was referred by Resident for grief cslg. Patient reports the following symptoms/concerns: sadness & grief Duration of problem: since death of oldest Son; Severity of problem: moderate  Objective: Mood: Depressed and Affect: Appropriate Risk of harm to self or others: No plan to harm self or others  Life Context: Family and Social: unchanged since last visit School/Work: Pt stays w/Dtr & her Sig Other Self-Care: allows self to cry, trying to deal w/Ex-Husb Life Changes: Death of oldest Son  Patient and/or Family's Strengths/Protective Factors: Concrete supports in place (healthy food, safe environments, etc.) and grief rxn  Goals Addressed: Patient will: 1.  Reduce symptoms of: depression and grief rxn  2.  Increase knowledge and/or ability of: coping skills, healthy habits and psychoedu of grieving process  3.  Demonstrate ability to: Increase healthy adjustment to current life circumstances and Begin healthy grieving over loss  Progress towards Goals: Ongoing  Interventions: Interventions utilized:  Solution-Focused Strategies, Supportive Counseling and Supportive Reflection Standardized Assessments completed: Not Needed  Patient and/or Family Response: Supportive Family & friends  Patient Centered Plan: Patient is on the following Treatment Plan(s): Address grief rxn & resulting health issues Pt faces Assessment: Patient currently experiencing: Questioning of Son's decision to have a DNR in place. Sadness over this Son's death.    Patient may benefit from [facility] approach.  Plan: 1. Follow up with behavioral health clinician on : 2 wks from now 2. Behavioral recommendations: Get out of the home as able, process grief in Journal 3. Referral(s): Integrated [employer] (In Clinic) 4. "From scale of 1-10, how likely are you to follow plan?": 9  Deneise Lever, LMFT

## 2019-12-28 ENCOUNTER — Ambulatory Visit: Payer: Medicare Other | Admitting: Behavioral Health

## 2019-12-28 ENCOUNTER — Other Ambulatory Visit: Payer: Self-pay

## 2019-12-28 DIAGNOSIS — F331 Major depressive disorder, recurrent, moderate: Secondary | ICD-10-CM

## 2019-12-28 DIAGNOSIS — F4321 Adjustment disorder with depressed mood: Secondary | ICD-10-CM

## 2019-12-28 NOTE — Progress Notes (Signed)
Pt: Kristen Boyle MRN# [id number]  Start time:  End time:  15 min telehealth session due to COVID-19 using Supportive Cslg & grief work.

## 2019-12-30 NOTE — BH Specialist Note (Signed)
Integrated Behavioral Health via Telemedicine Visit  12/09/2019 Kristen Boyle [id number]  Number of Integrated Behavioral Health visits: 1/6 Session Start time: 2:20pm  Session End time: 3:00pm Total time: 40   Referring Provider: Dr. Tonye Royalty, MD Patient/Family location: Pt is in her room at her Dtr's home alone for privacy [facility] Provider location: [facility] Office All persons participating in visit: Pt, Dtr (at the end) & Clinician Types of Service: Individual psychotherapy  I connected with [name] and/or Meredeth Ide Dtr Cassandra by Telephone and verified that I am speaking with the correct person using two identifiers.    Discussed confidentiality: Yes   I discussed the limitations of telemedicine and the availability of in person appointments.  Discussed there is a possibility of technology failure and discussed alternative modes of communication if that failure occurs.  I discussed that engaging in this telemedicine visit, they consent to the provision of behavioral healthcare and the services will be billed under their insurance.  Patient and/or legal guardian expressed understanding and consented to Telemedicine visit: Yes   Presenting Concerns: Patient and/or family reports the following symptoms/concerns: Sadness, grief rxn & tragedy of oldest Son's death recently in 2021/10/30Duration of problem: ~ 6 wks; Severity of problem: moderate  Patient and/or Family's Strengths/Protective Factors: Social connections and Social and Emotional competence  Goals Addressed: Patient will: 1.  Reduce symptoms of: depression and grief-Pt sts she wants some relief from her feelings of sadness  2.  Increase knowledge and/or ability of: coping skills and understanding of various ways individuals manifest their grief  3.  Demonstrate ability to: Increase healthy adjustment to current life circumstances and Begin healthy grieving over loss  Progress towards  Goals: Initiated psychotherapy today to address needs. Pt is willing & glad to speak to process her feelings & hurt.  Interventions: Interventions utilized:  Intake/Assessment & [facility] approach to care Standardized Assessments completed: Not Needed  Patient and/or Family Response: Dtr joined the session near the conclusion to articulate her concerns for Mom.  Assessment: Patient currently experiencing sadness, guilt, disappointment & grief.   Patient may benefit from cont'd discussion to process grief rxn.  Plan: 1. Follow up with behavioral health clinician on : 12/14/2019 @ 2:00pm 2. Behavioral recommendations: Attend the funeral to your best ability & speak about Son to his Str as able. Journal your feelings. 3. Referral(s): Integrated [employer] (In Clinic)  I discussed the assessment and treatment plan with the patient and/or parent/guardian. They were provided an opportunity to ask questions and all were answered. They agreed with the plan and demonstrated an understanding of the instructions.   They were advised to call back or seek an in-person evaluation if the symptoms worsen or if the condition fails to improve as anticipated.  Deneise Lever, LMFT

## 2020-01-04 ENCOUNTER — Ambulatory Visit: Payer: Medicare Other | Admitting: Behavioral Health

## 2020-01-04 ENCOUNTER — Other Ambulatory Visit: Payer: Self-pay

## 2020-01-12 ENCOUNTER — Encounter: Payer: Self-pay | Admitting: *Deleted

## 2020-01-18 NOTE — BH Specialist Note (Signed)
Integrated Behavioral Health via Telemedicine Visit  01/18/2020 Kristen Boyle [id number]  Number of Integrated Behavioral Health visits: 5/6 Session Start time: 1:30pm  Session End time: 2:00pm Total time: 30  Referring Provider: Dr. Tonye Royalty, MD Patient/Family location: Pt in room alone for privacy [facility] Provider location: [facility] Office All persons participating in visit: Pt & Clinician Types of Service: Individual psychotherapy  I connected with Kristen Boyle and/or Kristen Boyle's self by Telephone  (Video is [employer]or, mining) and verified that I am speaking with the correct person using two identifiers.Discussed confidentiality: Yes   I discussed the limitations of telemedicine and the availability of in person appointments.  Discussed there is a possibility of technology failure and discussed alternative modes of communication if that failure occurs.  I discussed that engaging in this telemedicine visit, they consent to the provision of behavioral healthcare and the services will be billed under their insurance.  Patient and/or legal guardian expressed understanding and consented to Telemedicine visit: Yes   Presenting Concerns: Patient and/or family reports the following symptoms/concerns: Pt is missing her Son that died in November 17, 2019. She is feeling guilt for not visiting him more.  Duration of problem: several months since Son's death; Severity of problem: moderate  Patient and/or Family's Strengths/Protective Factors: Strong Family support in her Dtr's home  Goals Addressed: Patient will: 1.  Reduce symptoms of: anxiety, depression and grief rxn  2.  Increase knowledge and/or ability of: coping skills, healthy habits and grieving in a healthy way  3.  Demonstrate ability to: Increase healthy adjustment to current life circumstances and Begin healthy grieving over loss  Progress towards Goals: Ongoing  Interventions: Interventions utilized:   Motivational Interviewing and Solution-Focused Strategies Standardized Assessments completed: Not Needed  Patient and/or Family Response: Pt has cont'd to Journal her feelings btwn sessions. This has promoted deeper understanding of her grief.   Assessment: Patient currently experiencing misunderstanding her Son's choice to put a DNR in place w/o discussing this w/her & family.   Patient may benefit from discussion of feelings around Son's loss, his decisions as they related to his health status & Pt autonomy.  Plan: 1. Follow up with behavioral health clinician on : 2 wks or when best over the Holidays 2. Behavioral recommendations: Cont to Journal. Discuss feelings w/Dtr when she is able. 3. Referral(s): Integrated [employer] (In Clinic), utilize your resources at [facility] & your friends who want to speak on the phone. Attend a Grief Support Gr[facility] after the Holidays as you are able & willing.  I discussed the assessment and treatment plan with the patient and/or parent/guardian. They were provided an opportunity to ask questions and all were answered. They agreed with the plan and demonstrated an understanding of the instructions.   They were advised to call back or seek an in-person evaluation if the symptoms worsen or if the condition fails to improve as anticipated.  Deneise Lever, LMFT

## 2020-01-25 ENCOUNTER — Telehealth: Payer: Self-pay | Admitting: Neurology

## 2020-01-25 ENCOUNTER — Encounter: Payer: Self-pay | Admitting: Neurology

## 2020-01-25 ENCOUNTER — Ambulatory Visit (INDEPENDENT_AMBULATORY_CARE_PROVIDER_SITE_OTHER): Payer: Medicare Other | Admitting: Neurology

## 2020-01-25 VITALS — BP 146/88 | HR 64 | Ht 63.0 in | Wt 157.0 lb

## 2020-01-25 DIAGNOSIS — Z5181 Encounter for therapeutic drug level monitoring: Secondary | ICD-10-CM

## 2020-01-25 DIAGNOSIS — G35 Multiple sclerosis: Secondary | ICD-10-CM

## 2020-01-25 DIAGNOSIS — R269 Unspecified abnormalities of gait and mobility: Secondary | ICD-10-CM

## 2020-01-25 NOTE — Telephone Encounter (Signed)
Medicare/medicaid order sent to [facility]. No auth they will reach out to the patient to schedule.  °

## 2020-01-25 NOTE — Progress Notes (Signed)
Reason for visit: Multiple sclerosis  Kristen Boyle is a 60 y.o. female  History of present illness:  Kristen Boyle is a 60 year old right-handed black female with history of multiple sclerosis associated with bilateral optic neuritis and a left hemiparesis with a gait disorder. The patient has been lost to follow-up, she was last seen here about 2-1/2 years ago. She was on Betaseron at that time. She has an AFO brace for the left foot but she has not been using it as it is uncomfortable. The patient over time has had a gradual slow change in her functional ability with increased problems with left-sided weakness and a decline in her ability to ambulate. The patient uses a walker with a seat to get around, she did have a fall several days ago when she got up out of bed and got dizzy and may have blacked out. Generally she does not fall while using the walker. She reports no difficulty controlling the bowels or the bladder. She does have significant visual impairment that she believes has slowly changed over time. She denies any double vision. She reports that both legs have gotten weaker and the left arm has gotten weaker. She has also noted some cognitive changes. Her primary care physician has set her up for some physical therapy which has not yet started. She comes to this office for further evaluation. She has not been on any disease modifying agents. In the past, she has been positive for the JC viral antibody.  Past Medical History:  Diagnosis Date  . Abnormality of gait 06/10/2012  . Depression   . Headache   . Hyperlipidemia   . MS (multiple sclerosis) ([place])     Past Surgical History:  Procedure Laterality Date  . CHOLECYSTECTOMY N/A 06/20/2015   Procedure: LAPAROSCOPIC CHOLECYSTECTOMY;  Surgeon: Ralene Ok, MD;  Location: [facility];  Service: General;  Laterality: N/A;  . DENTAL SURGERY    . TUBAL LIGATION Bilateral     Family History  Problem Relation Age of Onset  .  Alcoholism Father   . Diabetes Paternal Aunt   . Dementia Paternal Grandmother        Senile dementia of Alzheimer's type  . Colon cancer Cousin   . Colon cancer Maternal Aunt     Social history:  reports that she has never smoked. She has never used smokeless tobacco. She reports that she does not drink alcohol and does not use drugs.  Medications:  Prior to Admission medications   Medication Sig Start Date End Date Taking? Authorizing Provider  co-enzyme Q-10 50 MG capsule Take 50 mg by mouth daily.   Yes [provider]  ibuprofen (ADVIL,MOTRIN) 200 MG tablet Take 400 mg by mouth every 6 (six) hours as needed (For pain.).   Yes [provider]  BETASERON 0.3 MG KIT injection Inject 0.25mg  (58ml) sub-q every other day 09/02/17   Kathrynn Ducking, MD  Cholecalciferol 25 MCG (1000 UT) tablet Take 1 tablet (1,000 Units total) by mouth daily. 12/03/19   Andrew Au, MD  citalopram (CELEXA) 10 MG tablet Take 1 tablet (10 mg total) by mouth daily. 12/01/19 12/31/19  Andrew Au, MD  rosuvastatin (CRESTOR) 10 MG tablet Take 1 tablet (10 mg total) by mouth daily. 12/03/19 12/02/20  Andrew Au, MD      Allergies  Allergen Reactions  . Gilenya [Fingolimod]     Stomach upset    ROS:  Out of a complete 14 system  review of symptoms, the patient complains only of the following symptoms, and all other reviewed systems are negative. Weakness Walking difficulty Memory problems Vision difficulty  Blood pressure (!) 146/88, pulse 64, height $RemoveBe'5\' 3"'ppynERMKa$  (1.6 m), weight 157 lb (71.2 kg).  Physical Exam  General: The patient is alert and cooperative at the time of the examination.  Eyes: Pupils are equal, round, and reactive to light. Discs are flat bilaterally. Atrophy of the discs are seen bilaterally.  Neck: The neck is supple, no carotid bruits are noted.  Respiratory: The respiratory examination is clear.  Cardiovascular: The cardiovascular examination reveals a  regular rate and rhythm, no obvious murmurs or rubs are noted.  Skin: Extremities are without significant edema.  Neurologic Exam  Mental status: The patient is alert and oriented x 3 at the time of the examination. The patient has apparent normal recent and remote memory, with an apparently normal attention span and concentration ability.  Cranial nerves: Facial symmetry is present. There is good sensation of the face to pinprick and soft touch bilaterally. The strength of the facial muscles and the muscles to head turning and shoulder shrug are normal bilaterally. Speech is well enunciated, no aphasia or dysarthria is noted. Extraocular movements are full. Visual fields are somewhat restricted bilaterally. The tongue is midline, and the patient has symmetric elevation of the soft palate. No obvious hearing deficits are noted.  Motor: The motor testing reveals 5 over 5 strength of the left extremities. On the right side, the patient has good proximal strength of the left arm but has weakness with the intrinsic muscles of the left hand. Patient has diffuse 4-/5 strength of the left lower extremity, with more significant weakness with dorsiflexion of the left foot. Good symmetric motor tone is noted throughout.  Sensory: Sensory testing is notable for some decreased pinprick and soft touch and vibration sensation on the left arm and leg as compared to the right.  Coordination: Cerebellar testing reveals good finger-nose-finger bilaterally. The patient is unable perform heel-to-shin with the left leg due to weakness, can perform on the right.  Gait and station: Gait is wide-based with a circumduction gait with the left leg, the patient can walk with assistance but normally uses a walker. Tandem gait was not attempted. Romberg is negative.  Reflexes: Deep tendon reflexes are symmetric and normal bilaterally. Toes are downgoing bilaterally.   MRI brain 03/12/17:  IMPRESSION:  Abnormal MRI brain  (with and without) demonstrating: 1. Multiple supratentorial and infratentorial round and ovoid chronic demyelinating plaques.  2. No abnormal lesions are seen on post contrast views.  3. Compared to MRI on 03/23/15, no significant change.  * MRI scan images were reviewed online. I agree with the written report.    Assessment/Plan:  1. Multiple sclerosis  2. Left hemiparesis, gait disorder  3. Bilateral optic neuritis, decreased visual acuity  The patient has been off of disease modifying agents for about 2 and half years with some gradual decline in her functional level. The patient likely has a secondary progressive form of multiple sclerosis at this time. For this reason, we will initiate treatment with Ocrevus. The patient will be sent for blood work today, she has signed a form for Ocrevus, she will be set up for MRI of the brain and cervical spine. She will follow up here in 5 months.  Jill Alexanders MD 01/25/2020 8:47 AM  [facility] [address] [place], [place] [address]  Phone [phone] Fax [phone]

## 2020-01-27 ENCOUNTER — Telehealth: Payer: Self-pay | Admitting: Emergency Medicine

## 2020-01-27 LAB — CBC WITH DIFFERENTIAL/PLATELET
Basophils Absolute: 0 10*3/uL (ref 0.0–0.2)
Basos: 1 %
EOS (ABSOLUTE): 0.2 10*3/uL (ref 0.0–0.4)
Eos: 4 %
Hematocrit: 42.7 % (ref 34.0–46.6)
Hemoglobin: 13.3 g/dL (ref 11.1–15.9)
Immature Grans (Abs): 0 10*3/uL (ref 0.0–0.1)
Immature Granulocytes: 0 %
Lymphocytes Absolute: 3.4 10*3/uL — ABNORMAL HIGH (ref 0.7–3.1)
Lymphs: 65 %
MCH: 24.6 pg — ABNORMAL LOW (ref 26.6–33.0)
MCHC: 31.1 g/dL — ABNORMAL LOW (ref 31.5–35.7)
MCV: 79 fL (ref 79–97)
Monocytes Absolute: 0.4 10*3/uL (ref 0.1–0.9)
Monocytes: 7 %
Neutrophils Absolute: 1.2 10*3/uL — ABNORMAL LOW (ref 1.4–7.0)
Neutrophils: 23 %
Platelets: 298 10*3/uL (ref 150–450)
RBC: 5.4 x10E6/uL — ABNORMAL HIGH (ref 3.77–5.28)
RDW: 14.6 % (ref 11.7–15.4)
WBC: 5.2 10*3/uL (ref 3.4–10.8)

## 2020-01-27 LAB — VARICELLA ZOSTER ANTIBODY, IGG: Varicella zoster IgG: 1506 index (ref >165)

## 2020-01-27 LAB — QUANTIFERON-TB GOLD PLUS
QuantiFERON Mitogen Value: >10 IU/mL
QuantiFERON Nil Value: 0.04 IU/mL
QuantiFERON TB1 Ag Value: 0.05 IU/mL
QuantiFERON TB2 Ag Value: 0.05 IU/mL
QuantiFERON-TB Gold Plus: NEGATIVE

## 2020-01-27 LAB — HEPATITIS B CORE ANTIBODY, TOTAL: Hep B Core Total Ab: NEGATIVE

## 2020-01-27 LAB — HEPATITIS C ANTIBODY: Hep C Virus Ab: 0.1 s/co ratio (ref 0.0–0.9)

## 2020-01-27 NOTE — Telephone Encounter (Signed)
[facility]us Start form faxed to [facility], received confirmation. Completed forms given to [facility].

## 2020-02-09 NOTE — Addendum Note (Signed)
Addended by: Neomia Dear on: 02/09/2020 05:58 PM   Modules accepted: Orders

## 2020-02-17 DIAGNOSIS — Z20822 Contact with and (suspected) exposure to covid-19: Secondary | ICD-10-CM | POA: Diagnosis not present

## 2020-02-25 ENCOUNTER — Other Ambulatory Visit: Payer: Self-pay

## 2020-02-25 ENCOUNTER — Ambulatory Visit: Payer: Medicare Other | Attending: Internal Medicine

## 2020-02-25 ENCOUNTER — Telehealth: Payer: Self-pay

## 2020-02-25 DIAGNOSIS — M21372 Foot drop, left foot: Secondary | ICD-10-CM | POA: Insufficient documentation

## 2020-02-25 DIAGNOSIS — R531 Weakness: Secondary | ICD-10-CM | POA: Diagnosis not present

## 2020-02-25 DIAGNOSIS — M6281 Muscle weakness (generalized): Secondary | ICD-10-CM | POA: Insufficient documentation

## 2020-02-25 DIAGNOSIS — R2681 Unsteadiness on feet: Secondary | ICD-10-CM | POA: Diagnosis not present

## 2020-02-25 DIAGNOSIS — G35 Multiple sclerosis: Secondary | ICD-10-CM | POA: Diagnosis not present

## 2020-02-25 DIAGNOSIS — R2689 Other abnormalities of gait and mobility: Secondary | ICD-10-CM | POA: Diagnosis not present

## 2020-02-25 NOTE — Therapy (Signed)
[facility] [address] [place], [place], [address] Phone: [phone]   Fax:  [phone]  Physical Therapy Evaluation  Patient Details  Name: Kristen Boyle MRN: [id number] Date of Birth: 07/20/1960 Referring Provider (PT): Kristen Boyle referred but Kristen Boyle is PCP. Neurologist is Dr. Anne Boyle   Encounter Date: 02/25/2020   PT End of Session - 02/25/20 1413    Visit Number 1    Number of Visits 17    Date for PT Re-Evaluation 05/25/20   90 day cert, 60 day poc   Authorization Type medicare/medicaid, 10th visit progress note    PT Start Time 1410    PT Stop Time 1455    PT Time Calculation (min) 45 min    Equipment Utilized During Treatment Gait belt    Activity Tolerance Patient tolerated treatment well    Behavior During Therapy WFL for tasks assessed/performed           Past Medical History:  Diagnosis Date  . Abnormality of gait 06/10/2012  . Depression   . Headache   . Hyperlipidemia   . MS (multiple sclerosis) (HCC)     Past Surgical History:  Procedure Laterality Date  . CHOLECYSTECTOMY N/A 06/20/2015   Procedure: LAPAROSCOPIC CHOLECYSTECTOMY;  Surgeon: Axel Filler, MD;  Location: [facility];  Service: General;  Laterality: N/A;  . DENTAL SURGERY    . TUBAL LIGATION Bilateral     There were no vitals filed for this visit.    Subjective Assessment - 02/25/20 1413    Subjective Pt reports being diagnosed with MS in 9285. 60 year old right-handed black female with history of multiple sclerosis associated with bilateral optic neuritis and a left hemiparesis with a gait disorder with likely secondary progressive form. Pt came off betaseron about 6 months ago and has noticed increased weakness since then especially on the left. Pt reports that she prior to 6 months she was walking around the house with rollator or cane. Since that time she is not walking much other than very short distances. She  has spending more time in the bed and energy level has not been as good. Using rollator that has tranport chair capability most of time. Pt reports she used to have AFO from 5-6 years ago but stopped wearing as it was too painful on her foot.    Patient is accompained by: Family member   daughter   Pertinent History PMH: depression, headache, HLD    Patient Stated Goals Pt would like to be able to walk more and get out of house some without always being in the chair.    Currently in Pain? No/denies   denies pain currently but does report LBP a couple weeks ago that comes and goes.             [facility] PT Assessment - 02/25/20 1420      Assessment   Medical Diagnosis MS    Referring Provider (PT) Kristen Boyle referred but Kristen Boyle is PCP. Neurologist is Dr. Anne Boyle    Onset Date/Surgical Date 12/01/19   referral date. Has had MS since 1999   Hand Dominance Right      Precautions   Precautions Fall      Balance Screen   Has the patient fallen in the past 6 months Yes    How many times? 3   one she may have blacked out when got up   Has the patient had a decrease in activity level because  of a fear of falling?  Yes    Is the patient reluctant to leave their home because of a fear of falling?  Yes      Home Environment   Living Environment Private residence    Living Arrangements Children   daughter and her boyfriend and his family   Available Help at Discharge Family    Type of Home House    Home Access Level entry    Home Layout One level    Home Equipment Walker - 4 wheels;Cane - single point;Tub bench;Hand held shower head      Prior Function   Level of Independence Independent with basic ADLs;Independent with household mobility with device;Needs assistance with homemaking    Vocation On disability    Leisure watch ER      Cognition   Overall Cognitive Status Within Functional Limits for tasks assessed   sometimes has to think a little longer about her words      Observation/Other Assessments   Observations peripheral vision decreased by about 25% bilateral, left eye vision decreased      Sensation   Light Touch Appears Intact   reports doesn't feel quite the same on left leg     ROM / Strength   AROM / PROM / Strength Strength      Strength   Strength Assessment Site Shoulder;Elbow;Hand;Hip;Knee;Ankle    Right/Left Shoulder Right;Left    Right Shoulder Flexion 4/5    Left Shoulder Flexion 4/5    Right/Left Elbow Right;Left    Right Elbow Flexion 4/5    Right Elbow Extension 4/5    Left Elbow Flexion 4/5    Left Elbow Extension 4/5    Right/Left hand Right;Left    Right Hand Gross Grasp Functional    Left Hand Gross Grasp Functional    Right/Left Hip Right;Left    Right Hip Flexion 3+/5    Left Hip Flexion 2-/5    Left Hip ABduction 2-/5    Left Hip ADduction 2-/5    Right/Left Knee Right;Left    Right Knee Flexion 4/5    Right Knee Extension 4/5    Left Knee Flexion 3/5    Left Knee Extension 2+/5    Right/Left Ankle Right;Left    Right Ankle Dorsiflexion 4/5    Right Ankle Plantar Flexion 3+/5    Left Ankle Dorsiflexion 0/5    Left Ankle Plantar Flexion 2-/5      Bed Mobility   Bed Mobility Supine to Sit;Sit to Supine    Supine to Sit Supervision/Verbal cueing    Sit to Supine Contact Guard/Touching assist   needed to assist LLE on mat with arms     Transfers   Transfers Sit to Stand;Stand to Sit;Stand Pivot Transfers    Sit to Stand 4: Min guard    Five time sit to stand comments  32 sec from chair with hands    Stand to Sit 4: Min guard    Stand Pivot Transfers 4: Min assist      Ambulation/Gait   Ambulation/Gait Yes    Ambulation/Gait Assistance 4: Min assist    Ambulation Distance (Feet) 8 Feet    Assistive device Rollator    Gait Pattern Step-to pattern;Decreased step length - left;Decreased stance time - left;Decreased hip/knee flexion - left;Decreased dorsiflexion - left;Poor foot clearance - left    Ambulation  Surface Level;Indoor  Objective measurements completed on examination: See above findings.               PT Education - 02/25/20 1914    Education Details PT plan of care    Person(s) Educated Patient;Child(ren)    Methods Explanation    Comprehension Verbalized understanding            PT Short Term Goals - 02/25/20 1936      PT SHORT TERM GOAL #1   Title Pt will be able to perform intial HEP for strengthening with assist of daughter.    Time 4    Period Weeks    Status New    Target Date 03/26/20      PT SHORT TERM GOAL #2   Title Pt will be assessed for left AFO need and orthotist consult conducted.    Time 4    Period Weeks    Status New    Target Date 03/26/20      PT SHORT TERM GOAL #3   Title Pt will perform all transfers mod I for improved safety and mobility.    Time 4    Period Weeks    Status New    Target Date 03/26/20      PT SHORT TERM GOAL #4   Title Pt will be able to perform bed mobility mod I consistently for improved mobility.    Time 4    Period Weeks    Status New    Target Date 03/26/20      PT SHORT TERM GOAL #5   Title Pt will ambulate 21' with walker and left AFO CGA for short household distances.    Time 4    Period Weeks    Status New    Target Date 03/26/20             PT Long Term Goals - 02/25/20 1941      PT LONG TERM GOAL #1   Title Pt will decrease 5 x sit to stand from 32 sec to <22 sec for improved balance and functional strength.    Baseline 02/25/20 32 sec from chair with hands    Time 8    Period Weeks    Status New    Target Date 04/25/20      PT LONG TERM GOAL #2   Title Pt will be able to ambulate 200' with walker and AFO supervision for improved household mobility and short community distances.    Time 8    Period Weeks    Status New    Target Date 04/25/20      PT LONG TERM GOAL #3   Title Pt will be able to maintain standing >5 min with minimal UE support  for improved standing balance and to assist with ADLs.    Time 8    Period Weeks    Status New    Target Date 04/25/20                  Plan - 02/25/20 1916    Clinical Impression Statement Pt is 60 y/o female referred with diagnosis of MS with increasing difficulty ambulating and weakness. Pt has weakness in bilateral lower extremity left worse than right. No DF noted on left. Pt is fall risk based on 5 x sit to stand of 32 sec and needs UE support to rise. She only ambulated about 8' with rollator but has difficulty advancing LLE with left foot drop. Pt requires CGA/min  assist with transfers and bed mobility. Pt will benefit from skilled PT to address strength, balance and functional mobility deficits.    Personal Factors and Comorbidities Comorbidity 3+    Comorbidities depression, headache, HLD    Examination-Activity Limitations Stand;Stairs;Bed Mobility;Transfers;Bathing;Locomotion Level    Examination-Participation Restrictions Community Activity;Cleaning    Stability/Clinical Decision Making Evolving/Moderate complexity    Clinical Decision Making Moderate    Rehab Potential Good    PT Frequency 2x / week   plus eval   PT Duration 8 weeks    PT Treatment/Interventions ADLs/Self Care Home Management;Gait training;Stair training;Functional mobility training;Therapeutic activities;Therapeutic exercise;Balance training;Neuromuscular re-education;DME Instruction;Manual techniques;Orthotic Fit/Training;Patient/family education;Vestibular;Passive range of motion    PT Next Visit Plan Establish initial HEP for strengthening supine and seated. Trial AFOs on left with gait with rollator. Bed mobility and transfer training. (Pt had AFO in years past but was painful and could no longer wear it. I am requesting order for new AFO and will set up consult once received and we have an idea what may be best)    Consulted and Agree with Plan of Care Patient;Family member/caregiver    Family Member  Consulted daughter           Patient will benefit from skilled therapeutic intervention in order to improve the following deficits and impairments:  Abnormal gait,Decreased balance,Decreased activity tolerance,Decreased mobility,Decreased knowledge of use of DME,Decreased endurance,Decreased range of motion,Decreased strength,Impaired vision/preception  Visit Diagnosis: Other abnormalities of gait and mobility  Muscle weakness (generalized)  Unsteadiness on feet     Problem List Patient Active Problem List   Diagnosis Date Noted  . Hot flashes due to menopause 12/01/2019  . Encounter for gynecological examination with Papanicolaou smear of cervix 04/12/2016  . Constipation 04/12/2016  . Vaginal atrophy 04/12/2016  . Bilateral lower extremity edema with recent DOE 11/02/2015  . Microcytosis 10/16/2014  . Vitamin D insufficiency 04/20/2014  . Abnormality of gait 09/13/2013  . Depression 09/19/2011  . Multiple sclerosis (HCC) 03/06/2011  . Healthcare maintenance 03/06/2011  . Hyperlipidemia 09/17/2010    Ronn MelenaEmily A Rees Santistevan, PT, DPT, NCS 02/25/2020, 7:46 PM  [facility] [address] [place], [place], [address] Phone: [phone]   Fax:  [phone]  Name: Kristen Boyle MRN: [id number] Date of Birth: 1960/03/24

## 2020-02-25 NOTE — Telephone Encounter (Signed)
Dr. Anne Hahn, Kristen Boyle is being treated by physical therapy for increase weakness and trouble walking due to MS.  She will benefit from use of left AFO in order to improve safety with functional mobility.    If you agree, please submit request in EPIC under MD Order, Other Orders (list left AFO in comments) or fax to [facility] at [phone].   Thank you, Elmer Bales, PT, DPT, NCS    [facility] [address] [place], [place]  [address] Phone:  [phone] Fax:  [phone]

## 2020-02-28 ENCOUNTER — Other Ambulatory Visit: Payer: Self-pay

## 2020-02-28 ENCOUNTER — Other Ambulatory Visit: Payer: Self-pay | Admitting: Neurology

## 2020-02-28 ENCOUNTER — Ambulatory Visit: Payer: Medicare Other

## 2020-02-28 DIAGNOSIS — M21372 Foot drop, left foot: Secondary | ICD-10-CM

## 2020-02-28 DIAGNOSIS — M6281 Muscle weakness (generalized): Secondary | ICD-10-CM | POA: Diagnosis not present

## 2020-02-28 DIAGNOSIS — R269 Unspecified abnormalities of gait and mobility: Secondary | ICD-10-CM

## 2020-02-28 DIAGNOSIS — R2689 Other abnormalities of gait and mobility: Secondary | ICD-10-CM

## 2020-02-28 DIAGNOSIS — G35 Multiple sclerosis: Secondary | ICD-10-CM | POA: Diagnosis not present

## 2020-02-28 DIAGNOSIS — R2681 Unsteadiness on feet: Secondary | ICD-10-CM | POA: Diagnosis not present

## 2020-02-28 DIAGNOSIS — R531 Weakness: Secondary | ICD-10-CM | POA: Diagnosis not present

## 2020-02-28 NOTE — Progress Notes (Signed)
Left AFO order placed and signed, will fax to neuro rehab. Dr. Anne Hahn is out of the office.

## 2020-02-28 NOTE — Patient Instructions (Signed)
Access Code: [email or web address] URL: [email or web address] Date: 02/28/2020 Prepared by: Jethro Bastos  Exercises Supine Lower Trunk Rotation - 1 x daily - 5 x weekly - 1 sets - 10 reps - 10 seconds hold Hooklying Gluteal Sets - 1 x daily - 5 x weekly - 1 sets - 10 reps - 3 hold Supine March - 1 x daily - 5 x weekly - 2 sets - 5 reps Clamshell - 1 x daily - 5 x weekly - 1 sets - 10 reps Sit to Stand with Armchair - 1 x daily - 5 x weekly - 2 sets - 10 reps

## 2020-02-28 NOTE — Therapy (Addendum)
[facility] [address] [place], [place], [address] Phone: [phone]   Fax:  [phone]  Physical Therapy Treatment  Patient Details  Name: Kristen Boyle MRN: [id number] Date of Birth: 11/18/1960 Referring Provider (PT): Charissa Bash referred but Tonye Royalty is PCP. Neurologist is Dr. Anne Hahn   Encounter Date: 02/28/2020   PT End of Session - 02/28/20 0952    Visit Number 2    Number of Visits 17    Date for PT Re-Evaluation 05/25/20   90 day cert, 60 day poc   Authorization Type medicare/medicaid, 10th visit progress note    PT Start Time [phone]   patient arriving late   PT Stop Time 1035    PT Time Calculation (min) 43 min    Equipment Utilized During Treatment Gait belt    Activity Tolerance Patient tolerated treatment well    Behavior During Therapy WFL for tasks assessed/performed           Past Medical History:  Diagnosis Date  . Abnormality of gait 06/10/2012  . Depression   . Headache   . Hyperlipidemia   . MS (multiple sclerosis) (HCC)     Past Surgical History:  Procedure Laterality Date  . CHOLECYSTECTOMY N/A 06/20/2015   Procedure: LAPAROSCOPIC CHOLECYSTECTOMY;  Surgeon: Axel Filler, MD;  Location: [facility];  Service: General;  Laterality: N/A;  . DENTAL SURGERY    . TUBAL LIGATION Bilateral     There were no vitals filed for this visit.   Subjective Assessment - 02/28/20 0952    Subjective Patient reports no new changes/complaints since last visit. Did wake up with back pain this morning. Denies falls.    Patient is accompained by: Family member   daughter   Pertinent History PMH: depression, headache, HLD    Patient Stated Goals Pt would like to be able to walk more and get out of house some without always being in the chair.    Currently in Pain? Yes    Pain Score 8     Pain Location Back    Pain Orientation Medial    Pain Descriptors / Indicators Aching    Pain Onset  Yesterday    Pain Frequency Intermittent    Pain Relieving Factors Ibuprofen             [facility] Adult PT Treatment/Exercise - 02/28/20 0001      Bed Mobility   Bed Mobility Right Sidelying to Sit;Sit to Supine    Right Sidelying to Sit Supervision/Verbal cueing;Contact Guard/Touching assist   PT teaching patient how to use RLE to hook LLE and allow for improved independence with getting lower extremity off of mat, improved completion with second rep   Sit to Supine Contact Guard/Touching assist   needed assist with LLE onto mat     Transfers   Transfers Sit to Stand;Stand to Sit;Stand Pivot Transfers    Sit to Stand 4: Min guard;5: Supervision    Stand to Sit 5: Supervision;4: Min guard    Stand Pivot Transfers 4: Min assist    Stand Pivot Transfer Details (indicate cue type and reason) require assistance with stand pivot transfer, with transfering from rollator <> mat.    Comments completed sit <> stands from mat with BUE support and Rollator placed in front for safety, completed x 10 reps.      Ambulation/Gait   Ambulation/Gait Yes    Ambulation/Gait Assistance 4: Min assist    Ambulation/Gait Assistance Details completed ambulation  x 50 ft with rollator, with therapy out of session. Difficulty clearning LLE noted, intemrittent CGA to Min A required for balance.  No AFO donned with ambulation. Daughter reporting they will try to bring in AFo at next visit to allow for further assesment of what AFO was being utilzied prior    Ambulation Distance (Feet) 50 Feet    Assistive device Rollator    Gait Pattern Step-to pattern;Decreased step length - left;Decreased stance time - left;Decreased hip/knee flexion - left;Decreased dorsiflexion - left;Poor foot clearance - left    Ambulation Surface Level;Indoor      Exercises   Exercises Other Exercises    Other Exercises  Established initial supine/seated HEP. verbal cues required for proper completion.          Completed all of the  following exercises and established initial HEP. Intermittent rest breaks required due to fatigue.   Access Code: [email or web address] URL: [email or web address] Date: 02/28/2020 Prepared by: Jethro Bastos  Exercises Supine Lower Trunk Rotation - 1 x daily - 5 x weekly - 1 sets - 10 reps - 10 seconds hold Hooklying Gluteal Sets - 1 x daily - 5 x weekly - 1 sets - 10 reps - 3 hold Supine March - 1 x daily - 5 x weekly - 2 sets - 5 reps Clamshell - 1 x daily - 5 x weekly - 1 sets - 10 reps Sit to Stand with Armchair - 1 x daily - 5 x weekly - 2 sets - 10 reps        PT Education - 02/28/20 1036    Education Details Educated on Initial HEP    Person(s) Educated Patient;Child(ren)    Methods Explanation;Handout;Demonstration    Comprehension Verbalized understanding;Returned demonstration            PT Short Term Goals - 02/25/20 1936      PT SHORT TERM GOAL #1   Title Pt will be able to perform intial HEP for strengthening with assist of daughter.    Time 4    Period Weeks    Status New    Target Date 03/26/20      PT SHORT TERM GOAL #2   Title Pt will be assessed for left AFO need and orthotist consult conducted.    Time 4    Period Weeks    Status New    Target Date 03/26/20      PT SHORT TERM GOAL #3   Title Pt will perform all transfers mod I for improved safety and mobility.    Time 4    Period Weeks    Status New    Target Date 03/26/20      PT SHORT TERM GOAL #4   Title Pt will be able to perform bed mobility mod I consistently for improved mobility.    Time 4    Period Weeks    Status New    Target Date 03/26/20      PT SHORT TERM GOAL #5   Title Pt will ambulate 64' with walker and left AFO CGA for short household distances.    Time 4    Period Weeks    Status New    Target Date 03/26/20             PT Long Term Goals - 02/25/20 1941      PT LONG TERM GOAL #1   Title Pt will decrease 5 x sit to stand from 32 sec to <22 sec for  improved balance and functional strength.    Baseline 02/25/20 32 sec from chair with hands    Time 8    Period Weeks    Status New    Target Date 04/25/20      PT LONG TERM GOAL #2   Title Pt will be able to ambulate 200' with walker and AFO supervision for improved household mobility and short community distances.    Time 8    Period Weeks    Status New    Target Date 04/25/20      PT LONG TERM GOAL #3   Title Pt will be able to maintain standing >5 min with minimal UE support for improved standing balance and to assist with ADLs.    Time 8    Period Weeks    Status New    Target Date 04/25/20                 Plan - 02/28/20 1046    Clinical Impression Statement Skilled PT session focused on establishing initial HEP in supine. Increased challenge with certain exercises requiring modification and verbal cues. Added addition of trunk rotations to further reduce muscle tension in back and address pain, decreased pain at end of session reported. Will continue to progress toward all LTGs.    Personal Factors and Comorbidities Comorbidity 3+    Comorbidities depression, headache, HLD    Examination-Activity Limitations Stand;Stairs;Bed Mobility;Transfers;Bathing;Locomotion Level    Examination-Participation Restrictions Community Activity;Cleaning    Stability/Clinical Decision Making Evolving/Moderate complexity    Rehab Potential Good    PT Frequency 2x / week   plus eval   PT Duration 8 weeks    PT Treatment/Interventions ADLs/Self Care Home Management;Gait training;Stair training;Functional mobility training;Therapeutic activities;Therapeutic exercise;Balance training;Neuromuscular re-education;DME Instruction;Manual techniques;Orthotic Fit/Training;Patient/family education;Vestibular;Passive range of motion    PT Next Visit Plan How was HEP? Did daughter bring in AFO. Trial AFOs on left with gait with rollator. Bed mobility and transfer training. (Pt had AFO in years past but  was painful and could no longer wear it. I am requesting order for new AFO and will set up consult once received and we have an idea what may be best)    Consulted and Agree with Plan of Care Patient;Family member/caregiver    Family Member Consulted daughter           Patient will benefit from skilled therapeutic intervention in order to improve the following deficits and impairments:  Abnormal gait,Decreased balance,Decreased activity tolerance,Decreased mobility,Decreased knowledge of use of DME,Decreased endurance,Decreased range of motion,Decreased strength,Impaired vision/preception  Visit Diagnosis: Other abnormalities of gait and mobility  Muscle weakness (generalized)  Unsteadiness on feet     Problem List Patient Active Problem List   Diagnosis Date Noted  . Hot flashes due to menopause 12/01/2019  . Encounter for gynecological examination with Papanicolaou smear of cervix 04/12/2016  . Constipation 04/12/2016  . Vaginal atrophy 04/12/2016  . Bilateral lower extremity edema with recent DOE 11/02/2015  . Microcytosis 10/16/2014  . Vitamin D insufficiency 04/20/2014  . Abnormality of gait 09/13/2013  . Depression 09/19/2011  . Multiple sclerosis (HCC) 03/06/2011  . Healthcare maintenance 03/06/2011  . Hyperlipidemia 09/17/2010    Tempie Donning, PT, DPT 02/28/2020, 10:48 AM  [facility] [address] [place], [place], [address] Phone: [phone]   Fax:  [phone]  Name: Kristen Boyle MRN: [id number] Date of Birth: 16-Oct-1960

## 2020-03-02 DIAGNOSIS — G35 Multiple sclerosis: Secondary | ICD-10-CM | POA: Diagnosis not present

## 2020-03-03 ENCOUNTER — Other Ambulatory Visit: Payer: Self-pay

## 2020-03-03 ENCOUNTER — Ambulatory Visit: Payer: Medicare Other

## 2020-03-03 DIAGNOSIS — R2681 Unsteadiness on feet: Secondary | ICD-10-CM

## 2020-03-03 DIAGNOSIS — R531 Weakness: Secondary | ICD-10-CM | POA: Diagnosis not present

## 2020-03-03 DIAGNOSIS — G35 Multiple sclerosis: Secondary | ICD-10-CM | POA: Diagnosis not present

## 2020-03-03 DIAGNOSIS — M6281 Muscle weakness (generalized): Secondary | ICD-10-CM | POA: Diagnosis not present

## 2020-03-03 DIAGNOSIS — R2689 Other abnormalities of gait and mobility: Secondary | ICD-10-CM | POA: Diagnosis not present

## 2020-03-03 DIAGNOSIS — M21372 Foot drop, left foot: Secondary | ICD-10-CM | POA: Diagnosis not present

## 2020-03-03 NOTE — Patient Instructions (Signed)
Access Code: [email or web address] URL: [email or web address] Date: 03/03/2020 Prepared by: Gustavus Bryant  Exercises Supine Heel Slide - 1 x daily - 5 x weekly - 1 sets - 10 reps Supine Knee Extension Strengthening - 1 x daily - 5 x weekly - 1 sets - 10 reps

## 2020-03-03 NOTE — Therapy (Signed)
[facility] [address] [place], [place], [address] Phone: [phone]   Fax:  [phone]  Physical Therapy Treatment  Patient Details  Name: Kristen Boyle MRN: [id number] Date of Birth: [birth year] Referring Provider (PT): Charissa Bash referred but Tonye Royalty is PCP. Neurologist is Dr. Anne Hahn   Encounter Date: 03/03/2020   PT End of Session - 03/03/20 1559    Visit Number 3    Number of Visits 17    Date for PT Re-Evaluation 05/25/20   90 day cert, 60 day poc   Authorization Type medicare/medicaid, 10th visit progress note    PT Start Time 1400    PT Stop Time 1445    PT Time Calculation (min) 45 min    Equipment Utilized During Treatment Gait belt    Activity Tolerance Patient tolerated treatment well    Behavior During Therapy WFL for tasks assessed/performed           Past Medical History:  Diagnosis Date  . Abnormality of gait 06/10/2012  . Depression   . Headache   . Hyperlipidemia   . MS (multiple sclerosis) (HCC)     Past Surgical History:  Procedure Laterality Date  . CHOLECYSTECTOMY N/A 06/20/2015   Procedure: LAPAROSCOPIC CHOLECYSTECTOMY;  Surgeon: Axel Filler, MD;  Location: [facility];  Service: General;  Laterality: N/A;  . DENTAL SURGERY    . TUBAL LIGATION Bilateral     There were no vitals filed for this visit.   Subjective Assessment - 03/03/20 1433    Subjective Began infusion treatments for MS, cannot find brace/AFO and is agreeable to have new one fashioned, has been trying to walk more    Patient is accompained by: Family member   daughter   Pertinent History PMH: depression, headache, HLD    Patient Stated Goals Pt would like to be able to walk more and get out of house some without always being in the chair.    Currently in Pain? No/denies    Pain Onset --                             [facility] Adult PT Treatment/Exercise - 03/03/20 0001      Transfers    Transfers Sit to Stand    Sit to Stand 4: Min guard    Stand to Sit --   performed 2x5 from mat with CGA of PT   Stand Pivot Transfers --   Able to perform under S but daughter tends to over assist   Stand Pivot Transfer Details (indicate cue type and reason) close S    Comments able to sit eccentriaclly      Ambulation/Gait   Ambulation/Gait Yes    Ambulation/Gait Assistance 4: Min guard    Ambulation/Gait Assistance Details completed 173ft in each direction, L AFO fashioned fro yellow t-band    Ambulation Distance (Feet) 115 Feet    Assistive device Rollator    Gait Pattern Step-to pattern;Step-through pattern    Ambulation Surface Level;Indoor      Exercises   Other Exercises  performed seated FAQs and marching 21x10, followed by supine heel slides, ABD/ADD all to 1x10 with intermittent tactile assist of PT, frequent rest breaks taken as needed                    PT Short Term Goals - 02/25/20 1936      PT SHORT TERM GOAL #1  Title Pt will be able to perform intial HEP for strengthening with assist of daughter.    Time 4    Period Weeks    Status New    Target Date 03/26/20      PT SHORT TERM GOAL #2   Title Pt will be assessed for left AFO need and orthotist consult conducted.    Time 4    Period Weeks    Status New    Target Date 03/26/20      PT SHORT TERM GOAL #3   Title Pt will perform all transfers mod I for improved safety and mobility.    Time 4    Period Weeks    Status New    Target Date 03/26/20      PT SHORT TERM GOAL #4   Title Pt will be able to perform bed mobility mod I consistently for improved mobility.    Time 4    Period Weeks    Status New    Target Date 03/26/20      PT SHORT TERM GOAL #5   Title Pt will ambulate 23' with walker and left AFO CGA for short household distances.    Time 4    Period Weeks    Status New    Target Date 03/26/20             PT Long Term Goals - 02/25/20 1941      PT LONG TERM GOAL #1    Title Pt will decrease 5 x sit to stand from 32 sec to <22 sec for improved balance and functional strength.    Baseline 02/25/20 32 sec from chair with hands    Time 8    Period Weeks    Status New    Target Date 04/25/20      PT LONG TERM GOAL #2   Title Pt will be able to ambulate 200' with walker and AFO supervision for improved household mobility and short community distances.    Time 8    Period Weeks    Status New    Target Date 04/25/20      PT LONG TERM GOAL #3   Title Pt will be able to maintain standing >5 min with minimal UE support for improved standing balance and to assist with ADLs.    Time 8    Period Weeks    Status New    Target Date 04/25/20                 Plan - 03/03/20 1600    Clinical Impression Statement Treatment focused on mobility andLE/core strengthening, they cannot find AFO and are agreeable to new one to be fabricated, ambulation of 175ft CW/CCW using L AFO out of yellow t-band, fatigued easily but was able to tolerate all tasks, improved overall mobility and transfer status but daughter tends to over assist    Personal Factors and Comorbidities Comorbidity 3+    Comorbidities depression, headache, HLD    Examination-Activity Limitations Stand;Stairs;Bed Mobility;Transfers;Bathing;Locomotion Level    Examination-Participation Restrictions Community Activity;Cleaning    Stability/Clinical Decision Making Evolving/Moderate complexity    Clinical Decision Making Moderate    Rehab Potential Good    PT Frequency 2x / week   plus eval   PT Duration 8 weeks    PT Treatment/Interventions ADLs/Self Care Home Management;Gait training;Stair training;Functional mobility training;Therapeutic activities;Therapeutic exercise;Balance training;Neuromuscular re-education;DME Instruction;Manual techniques;Orthotic Fit/Training;Patient/family education;Vestibular;Passive range of motion    PT Next Visit Plan obtain AFO consult,  continue gait, transfer and strength  training to impprove mobility and transfers, encourage dtr to allow patient to perform I    PT Home Exercise Plan added SAQ and heel slides    Consulted and Agree with Plan of Care Patient;Family member/caregiver    Family Member Consulted daughter           Patient will benefit from skilled therapeutic intervention in order to improve the following deficits and impairments:  Abnormal gait,Decreased balance,Decreased activity tolerance,Decreased mobility,Decreased knowledge of use of DME,Decreased endurance,Decreased range of motion,Decreased strength,Impaired vision/preception  Visit Diagnosis: Unsteadiness on feet  Decreased strength     Problem List Patient Active Problem List   Diagnosis Date Noted  . Hot flashes due to menopause 12/01/2019  . Encounter for gynecological examination with Papanicolaou smear of cervix 04/12/2016  . Constipation 04/12/2016  . Vaginal atrophy 04/12/2016  . Bilateral lower extremity edema with recent DOE 11/02/2015  . Microcytosis 10/16/2014  . Vitamin D insufficiency 04/20/2014  . Abnormality of gait 09/13/2013  . Depression 09/19/2011  . Multiple sclerosis (HCC) 03/06/2011  . Healthcare maintenance 03/06/2011  . Hyperlipidemia 09/17/2010    Hildred Laser 03/03/2020, 4:06 PM   [facility] [address] [place], [place], [address] Phone: [phone]   Fax:  [phone]  Name: Kristen Boyle MRN: [id number] Date of Birth: [birth year]

## 2020-03-06 ENCOUNTER — Ambulatory Visit: Payer: Medicare Other

## 2020-03-06 ENCOUNTER — Other Ambulatory Visit: Payer: Self-pay

## 2020-03-06 DIAGNOSIS — G35 Multiple sclerosis: Secondary | ICD-10-CM

## 2020-03-06 DIAGNOSIS — R2681 Unsteadiness on feet: Secondary | ICD-10-CM

## 2020-03-06 DIAGNOSIS — R2689 Other abnormalities of gait and mobility: Secondary | ICD-10-CM

## 2020-03-06 DIAGNOSIS — M6281 Muscle weakness (generalized): Secondary | ICD-10-CM | POA: Diagnosis not present

## 2020-03-06 DIAGNOSIS — R531 Weakness: Secondary | ICD-10-CM | POA: Diagnosis not present

## 2020-03-06 DIAGNOSIS — M21372 Foot drop, left foot: Secondary | ICD-10-CM

## 2020-03-06 NOTE — Therapy (Signed)
[facility] [address] [place], [place], [address] Phone: [phone]   Fax:  [phone]  Physical Therapy Treatment  Patient Details  Name: Kristen Boyle MRN: [id number] Date of Birth: 1960/12/11 Referring Provider (PT): Charissa Bash referred but Tonye Royalty is PCP. Neurologist is Dr. Anne Hahn   Encounter Date: 03/06/2020   PT End of Session - 03/06/20 1206    Visit Number 4    Number of Visits 17    Date for PT Re-Evaluation 05/25/20   90 day cert, 60 day poc   Authorization Type medicare/medicaid, 10th visit progress note    PT Start Time 1105    PT Stop Time 1150    PT Time Calculation (min) 45 min    Equipment Utilized During Treatment Gait belt    Activity Tolerance Patient tolerated treatment well    Behavior During Therapy WFL for tasks assessed/performed           Past Medical History:  Diagnosis Date  . Abnormality of gait 06/10/2012  . Depression   . Headache   . Hyperlipidemia   . MS (multiple sclerosis) (HCC)     Past Surgical History:  Procedure Laterality Date  . CHOLECYSTECTOMY N/A 06/20/2015   Procedure: LAPAROSCOPIC CHOLECYSTECTOMY;  Surgeon: Axel Filler, MD;  Location: [facility];  Service: General;  Laterality: N/A;  . DENTAL SURGERY    . TUBAL LIGATION Bilateral     There were no vitals filed for this visit.   Subjective Assessment - 03/06/20 1207    Subjective No new complaints, denies recent falls or near falls, c/o of occaisonal L knee buckling (>2 weeks ago)    Patient is accompained by: Family member   daughter   Pertinent History PMH: depression, headache, HLD    Patient Stated Goals Pt would like to be able to walk more and get out of house some without always being in the chair.    Currently in Pain? No/denies              Walker adjustments: Pt's walker handles were too high for her. Rollator's back rest was towards the side where patient would  Be standing.  This was because rollator has wheelchair leg rest on opposite side. The set up wasn't ideal for patient as, if patient had to sit while walking, she would have to walk around the rollator and sit on opposite side. Pt's back rest was moved to opposite side. Handles were lowered for pt's height. Pt and daughter both educated that this way, if she gets tired, she can lock the brakes and turn around and sit down.   Pt reports that at home, she goes from room to room holding on to wall and furniture. Pt and daughter educated that wall/furniture surfing is not safe for her as it will enable her to use solid surfaces and when she is presented with surface where she doesn't have anything to hold on to, she will likely loose her balance and risk a fall. Safer alternative would be to practice and become more familiar with using her quad cane for short distance ambulation (pt educated to use Rolator for indoor/outdoor mobility) as she is not safe to walk with quad cane I. Daughter asked to practice using quad cane with her with 3 stpe gait pattern (Cane-Left-right): 1 x 20 with min A with 2 pt gait pattern, 1 x 95' with CGA with 3 pt gait pattern.  Practiced mat to walker transfer with min  cues for walker safety for putting on brakes. X 2 Practiced ambulating 10 feet and then sitting down on walker: x 2, min verbal cues for walker safety Practiced scenario where she would walk up to a sink or stove: turn around so her back is facing stove/sink: locks brakes, turns around, unlocks brakes and walks fwd. Pt educated to not let hit walker seat behind her knee (take small steps) to prevent knees from buckling. Pt unable to perform hamstring scoots due to walker seat is too tall and she cannot reach her feet to floor.  Daughter educated on getting a gait belt for her to practice walking with quad cane at home.     Orthotic consult set with Thayer Ohm for 03/17/20 appt at 2 pm.                     PT Short  Term Goals - 02/25/20 1936      PT SHORT TERM GOAL #1   Title Pt will be able to perform intial HEP for strengthening with assist of daughter.    Time 4    Period Weeks    Status New    Target Date 03/26/20      PT SHORT TERM GOAL #2   Title Pt will be assessed for left AFO need and orthotist consult conducted.    Time 4    Period Weeks    Status New    Target Date 03/26/20      PT SHORT TERM GOAL #3   Title Pt will perform all transfers mod I for improved safety and mobility.    Time 4    Period Weeks    Status New    Target Date 03/26/20      PT SHORT TERM GOAL #4   Title Pt will be able to perform bed mobility mod I consistently for improved mobility.    Time 4    Period Weeks    Status New    Target Date 03/26/20      PT SHORT TERM GOAL #5   Title Pt will ambulate 32' with walker and left AFO CGA for short household distances.    Time 4    Period Weeks    Status New    Target Date 03/26/20             PT Long Term Goals - 02/25/20 1941      PT LONG TERM GOAL #1   Title Pt will decrease 5 x sit to stand from 32 sec to <22 sec for improved balance and functional strength.    Baseline 02/25/20 32 sec from chair with hands    Time 8    Period Weeks    Status New    Target Date 04/25/20      PT LONG TERM GOAL #2   Title Pt will be able to ambulate 200' with walker and AFO supervision for improved household mobility and short community distances.    Time 8    Period Weeks    Status New    Target Date 04/25/20      PT LONG TERM GOAL #3   Title Pt will be able to maintain standing >5 min with minimal UE support for improved standing balance and to assist with ADLs.    Time 8    Period Weeks    Status New    Target Date 04/25/20  Plan - 03/06/20 1202    Clinical Impression Statement Treatment was focused on adjusting rollator height and features, energy conservation, safety awareness and preserving indepdnence. Pt and daughter both  verbalized understanding from today's session (see treatment section for more details)    Personal Factors and Comorbidities Comorbidity 3+    Comorbidities depression, headache, HLD    Examination-Activity Limitations Stand;Stairs;Bed Mobility;Transfers;Bathing;Locomotion Level    Examination-Participation Restrictions Community Activity;Cleaning    Stability/Clinical Decision Making Evolving/Moderate complexity    Rehab Potential Good    PT Frequency 2x / week   plus eval   PT Duration 8 weeks    PT Treatment/Interventions ADLs/Self Care Home Management;Gait training;Stair training;Functional mobility training;Therapeutic activities;Therapeutic exercise;Balance training;Neuromuscular re-education;DME Instruction;Manual techniques;Orthotic Fit/Training;Patient/family education;Vestibular;Passive range of motion    PT Next Visit Plan AFO consult set and confirmed with Thayer Ohm on 03/18/19 at 2 pm (spoke to [place] at [place])- Confirm this with patient. Work on Software engineer for ambulation, locking/unlocking brackes before sit to stand transfers, turning around and positioning walker when cooking/brushing/prepping food etc. Continue to educate pt on reducing wall/furniture surfing and progress with use of quad cane with 3 pt gait pattern for safety.    PT Home Exercise Plan added SAQ and heel slides    Consulted and Agree with Plan of Care Patient;Family member/caregiver    Family Member Consulted daughter           Patient will benefit from skilled therapeutic intervention in order to improve the following deficits and impairments:  Abnormal gait,Decreased balance,Decreased activity tolerance,Decreased mobility,Decreased knowledge of use of DME,Decreased endurance,Decreased range of motion,Decreased strength,Impaired vision/preception  Visit Diagnosis: Unsteadiness on feet  Decreased strength  Other abnormalities of gait and mobility  Muscle weakness (generalized)  Multiple sclerosis  (HCC)  Left foot drop     Problem List Patient Active Problem List   Diagnosis Date Noted  . Hot flashes due to menopause 12/01/2019  . Encounter for gynecological examination with Papanicolaou smear of cervix 04/12/2016  . Constipation 04/12/2016  . Vaginal atrophy 04/12/2016  . Bilateral lower extremity edema with recent DOE 11/02/2015  . Microcytosis 10/16/2014  . Vitamin D insufficiency 04/20/2014  . Abnormality of gait 09/13/2013  . Depression 09/19/2011  . Multiple sclerosis (HCC) 03/06/2011  . Healthcare maintenance 03/06/2011  . Hyperlipidemia 09/17/2010    Ileana Ladd, PT 03/06/2020, 12:08 PM  [facility] [address] [place], [place], [address] Phone: [phone]   Fax:  [phone]  Name: Kristen Boyle MRN: [id number] Date of Birth: Dec 25, 1960

## 2020-03-09 ENCOUNTER — Ambulatory Visit: Payer: Medicare Other

## 2020-03-13 ENCOUNTER — Other Ambulatory Visit: Payer: Self-pay

## 2020-03-13 ENCOUNTER — Ambulatory Visit: Payer: Medicare Other

## 2020-03-13 DIAGNOSIS — R2689 Other abnormalities of gait and mobility: Secondary | ICD-10-CM

## 2020-03-13 DIAGNOSIS — G35 Multiple sclerosis: Secondary | ICD-10-CM | POA: Diagnosis not present

## 2020-03-13 DIAGNOSIS — R2681 Unsteadiness on feet: Secondary | ICD-10-CM

## 2020-03-13 DIAGNOSIS — M6281 Muscle weakness (generalized): Secondary | ICD-10-CM | POA: Diagnosis not present

## 2020-03-13 DIAGNOSIS — R531 Weakness: Secondary | ICD-10-CM | POA: Diagnosis not present

## 2020-03-13 DIAGNOSIS — M21372 Foot drop, left foot: Secondary | ICD-10-CM | POA: Diagnosis not present

## 2020-03-13 NOTE — Therapy (Signed)
[facility] [address] [place], [place], [address] Phone: [phone]   Fax:  [phone]  Physical Therapy Treatment  Patient Details  Name: Kristen Boyle MRN: [id number] Date of Birth: 1960/02/01 Referring Provider (PT): Charissa Bash referred but Tonye Royalty is PCP. Neurologist is Dr. Anne Hahn   Encounter Date: 03/13/2020   PT End of Session - 03/13/20 1024    Visit Number 5    Number of Visits 17    Date for PT Re-Evaluation 05/25/20   90 day cert, 60 day poc   Authorization Type medicare/medicaid, 10th visit progress note    PT Start Time 1017    PT Stop Time 1100    PT Time Calculation (min) 43 min    Equipment Utilized During Treatment Gait belt    Activity Tolerance Patient tolerated treatment well    Behavior During Therapy WFL for tasks assessed/performed           Past Medical History:  Diagnosis Date  . Abnormality of gait 06/10/2012  . Depression   . Headache   . Hyperlipidemia   . MS (multiple sclerosis) (HCC)     Past Surgical History:  Procedure Laterality Date  . CHOLECYSTECTOMY N/A 06/20/2015   Procedure: LAPAROSCOPIC CHOLECYSTECTOMY;  Surgeon: Axel Filler, MD;  Location: [facility];  Service: General;  Laterality: N/A;  . DENTAL SURGERY    . TUBAL LIGATION Bilateral     There were no vitals filed for this visit.   Subjective Assessment - 03/13/20 1020    Subjective Patient reports no new changes/complaints. No falls. Patient is completing HEP but has not completed this week. Daughter stating it is easier for her to put pants on.    Patient is accompained by: Family member   daughter   Pertinent History PMH: depression, headache, HLD    Patient Stated Goals Pt would like to be able to walk more and get out of house some without always being in the chair.    Currently in Pain? No/denies             [facility] Adult PT Treatment/Exercise - 03/13/20 0001      Transfers    Transfers Sit to Stand;Stand to Sit    Sit to Stand 4: Min guard    Stand to Sit 4: Min guard    Stand Pivot Transfers 4: Min guard    Stand Pivot Transfer Details (indicate cue type and reason) verbal cues to lock brakes prior to transfering from rollator. PT educating on proper brake management with rollator ot promote improved safety.    Comments completed sit <> stand training from mat with RLE placed on 2" block to promote weight shift to LLE for strengthening, completed 3 x 5 reps with short rest breaks between sets.      Ambulation/Gait   Ambulation/Gait Yes    Ambulation/Gait Assistance 4: Min guard    Ambulation/Gait Assistance Details Completed ambulation with various AFO to determine best option for improved gait pattern. Completed ambulation x 115 with L [place], patient demo continued difficulty with dragging LLE on ground as well as mild recurvatum noted. Increased challenge with swing noted with brace donned. Followed with ambulation x 115 ft with L Thuasne brace donned, patient demonstrating improved toe clearance  and heel toe pattern with this AFO donned. Patient still demonstrate mild recurvatum. No heel wedge used today during session but may be beneficial at next session. PT educating that Thayer Ohm from [facility] will be  at next appointment. No pain noted with any ambulation today.    Ambulation Distance (Feet) 115 Feet   x 2   Assistive device Rollator    Gait Pattern Step-to pattern;Step-through pattern    Ambulation Surface Level;Indoor      Neuro Re-ed    Neuro Re-ed Details  With patient supine completed PNF D1 pattern with LLE completed 3 x 5 reps initially starting with passive working toward active motion to promote improved strengthening, increased challenge noted with hip/knee flexion.      Exercises   Exercises Other Exercises    Other Exercises  With pillowcase under LLE, completed heel slides x 10 reps with PT providing verbal/tactile cues for proper  completion. Rest break required after completion.             PT Education - 03/13/20 1209    Education Details educated that [place] from [place] will be present at appt on 2/25    Person(s) Educated Patient;Child(ren)    Methods Explanation    Comprehension Verbalized understanding            PT Short Term Goals - 02/25/20 1936      PT SHORT TERM GOAL #1   Title Pt will be able to perform intial HEP for strengthening with assist of daughter.    Time 4    Period Weeks    Status New    Target Date 03/26/20      PT SHORT TERM GOAL #2   Title Pt will be assessed for left AFO need and orthotist consult conducted.    Time 4    Period Weeks    Status New    Target Date 03/26/20      PT SHORT TERM GOAL #3   Title Pt will perform all transfers mod I for improved safety and mobility.    Time 4    Period Weeks    Status New    Target Date 03/26/20      PT SHORT TERM GOAL #4   Title Pt will be able to perform bed mobility mod I consistently for improved mobility.    Time 4    Period Weeks    Status New    Target Date 03/26/20      PT SHORT TERM GOAL #5   Title Pt will ambulate 50' with walker and left AFO CGA for short household distances.    Time 4    Period Weeks    Status New    Target Date 03/26/20             PT Long Term Goals - 02/25/20 1941      PT LONG TERM GOAL #1   Title Pt will decrease 5 x sit to stand from 32 sec to <22 sec for improved balance and functional strength.    Baseline 02/25/20 32 sec from chair with hands    Time 8    Period Weeks    Status New    Target Date 04/25/20      PT LONG TERM GOAL #2   Title Pt will be able to ambulate 200' with walker and AFO supervision for improved household mobility and short community distances.    Time 8    Period Weeks    Status New    Target Date 04/25/20      PT LONG TERM GOAL #3   Title Pt will be able to maintain standing >5 min with minimal UE support for improved standing  balance and to  assist with ADLs.    Time 8    Period Weeks    Status New    Target Date 04/25/20                 Plan - 03/13/20 1217    Clinical Impression Statement Today's skilled PT session focused on gait training with various AFO to determine improvements in gait pattern, patient benefiting from L Thuasne AFO most during session. Continued activites during session promote improved strengthening of LLE and weight shift/stance. Patient will continue to benefit from skilled PT services to progrss toward LTGs.    Personal Factors and Comorbidities Comorbidity 3+    Comorbidities depression, headache, HLD    Examination-Activity Limitations Stand;Stairs;Bed Mobility;Transfers;Bathing;Locomotion Level    Examination-Participation Restrictions Community Activity;Cleaning    Stability/Clinical Decision Making Evolving/Moderate complexity    Rehab Potential Good    PT Frequency 2x / week   plus eval   PT Duration 8 weeks    PT Treatment/Interventions ADLs/Self Care Home Management;Gait training;Stair training;Functional mobility training;Therapeutic activities;Therapeutic exercise;Balance training;Neuromuscular re-education;DME Instruction;Manual techniques;Orthotic Fit/Training;Patient/family education;Vestibular;Passive range of motion    PT Next Visit Plan AFO consult set and confirmed with Thayer Ohm on 03/18/19 at 2 pm (spoke to [place] at [place]). L Thuasne brace worked best.  Work on Software engineer for ambulation, locking/unlocking brackes before sit to stand transfers, turning around and positioning walker when [employer] etc. Continue to educate pt on reducing wall/furniture surfing and progress with use of quad cane with 3 pt gait pattern for safety.    PT Home Exercise Plan added SAQ and heel slides    Consulted and Agree with Plan of Care Patient;Family member/caregiver    Family Member Consulted daughter           Patient will benefit from skilled therapeutic intervention  in order to improve the following deficits and impairments:  Abnormal gait,Decreased balance,Decreased activity tolerance,Decreased mobility,Decreased knowledge of use of DME,Decreased endurance,Decreased range of motion,Decreased strength,Impaired vision/preception  Visit Diagnosis: Unsteadiness on feet  Other abnormalities of gait and mobility  Muscle weakness (generalized)     Problem List Patient Active Problem List   Diagnosis Date Noted  . Hot flashes due to menopause 12/01/2019  . Encounter for gynecological examination with Papanicolaou smear of cervix 04/12/2016  . Constipation 04/12/2016  . Vaginal atrophy 04/12/2016  . Bilateral lower extremity edema with recent DOE 11/02/2015  . Microcytosis 10/16/2014  . Vitamin D insufficiency 04/20/2014  . Abnormality of gait 09/13/2013  . Depression 09/19/2011  . Multiple sclerosis (HCC) 03/06/2011  . Healthcare maintenance 03/06/2011  . Hyperlipidemia 09/17/2010    Tempie Donning, PT, DPT 03/13/2020, 12:21 PM  [facility] [address] [place], [place], [address] Phone: [phone]   Fax:  [phone]  Name: Kristen Boyle MRN: [id number] Date of Birth: 1960-05-02

## 2020-03-16 DIAGNOSIS — G35 Multiple sclerosis: Secondary | ICD-10-CM | POA: Diagnosis not present

## 2020-03-17 ENCOUNTER — Ambulatory Visit: Payer: Medicare Other

## 2020-03-17 ENCOUNTER — Telehealth: Payer: Self-pay

## 2020-03-17 ENCOUNTER — Other Ambulatory Visit: Payer: Self-pay

## 2020-03-17 DIAGNOSIS — R531 Weakness: Secondary | ICD-10-CM | POA: Diagnosis not present

## 2020-03-17 DIAGNOSIS — R2689 Other abnormalities of gait and mobility: Secondary | ICD-10-CM

## 2020-03-17 DIAGNOSIS — G35 Multiple sclerosis: Secondary | ICD-10-CM | POA: Diagnosis not present

## 2020-03-17 DIAGNOSIS — M6281 Muscle weakness (generalized): Secondary | ICD-10-CM | POA: Diagnosis not present

## 2020-03-17 DIAGNOSIS — R2681 Unsteadiness on feet: Secondary | ICD-10-CM | POA: Diagnosis not present

## 2020-03-17 DIAGNOSIS — M21372 Foot drop, left foot: Secondary | ICD-10-CM | POA: Diagnosis not present

## 2020-03-17 NOTE — Telephone Encounter (Signed)
Dr. Anne Hahn, I saw Kristen Boyle today for PT. Her daughter reported that she had her Ocrevus infusion yesterday and right after she could hardly move legs and has been very weak since. Had a fall when got home yesterday as well. Daughter states this is only second time she has had this and first time was quite some time back and same thing happened but thought may have been due to covid. Now wondering if there may be some connection with the Ocrevus. Is it common to develop weakness right after infusions? I am not familiar enough with this medication to know. She did have more weakness during my session but seemed better than what daughter reported from earlier in day as was able to stand and walk some. Had not been able to stand since after infusion. Thanks so much for any guidance you have. Elmer Bales, PT, DPT, NCS

## 2020-03-17 NOTE — Therapy (Signed)
[facility] [address] [place], [place], [address] Phone: [phone]   Fax:  [phone]  Physical Therapy Treatment  Patient Details  Name: Kristen Boyle MRN: [id number] Date of Birth: 1960-10-18 Referring Provider (PT): Charissa Bash referred but Tonye Royalty is PCP. Neurologist is Dr. Anne Hahn   Encounter Date: 03/17/2020   PT End of Session - 03/17/20 1404    Visit Number 6    Number of Visits 17    Date for PT Re-Evaluation 05/25/20   90 day cert, 60 day poc   Authorization Type medicare/medicaid, 10th visit progress note    PT Start Time 1400    PT Stop Time 1445    PT Time Calculation (min) 45 min    Equipment Utilized During Treatment Gait belt    Activity Tolerance Patient tolerated treatment well    Behavior During Therapy WFL for tasks assessed/performed           Past Medical History:  Diagnosis Date  . Abnormality of gait 06/10/2012  . Depression   . Headache   . Hyperlipidemia   . MS (multiple sclerosis) (HCC)     Past Surgical History:  Procedure Laterality Date  . CHOLECYSTECTOMY N/A 06/20/2015   Procedure: LAPAROSCOPIC CHOLECYSTECTOMY;  Surgeon: Axel Filler, MD;  Location: [facility];  Service: General;  Laterality: N/A;  . DENTAL SURGERY    . TUBAL LIGATION Bilateral     There were no vitals filed for this visit.   Subjective Assessment - 03/17/20 1405    Subjective Pt's daughter reports that she had her ocrevus infusion yesterday and right after her legs became very weak and still feeling really weak today. Daughter having to help her a lot more with transfers. Pt reports that she feels so weak and she can't even stand up on own. Has been eating and drinking ok. Pt also reports fall getting in bathroom yesterday with daughter as rollator will not fit in and pt's legs buckled.    Patient is accompained by: Family member   daughter   Pertinent History PMH: depression, headache, HLD     Patient Stated Goals Pt would like to be able to walk more and get out of house some without always being in the chair.    Currently in Pain? No/denies                             [facility] Adult PT Treatment/Exercise - 03/17/20 1408      Transfers   Transfers Stand Pivot Transfers;Sit to Stand;Stand to Sit    Sit to Stand 4: Min guard    Stand to Sit 4: Min guard    Stand Pivot Transfers 4: Min guard    Stand Pivot Transfer Details (indicate cue type and reason) transport chair to/from mat      Ambulation/Gait   Ambulation/Gait Yes    Ambulation/Gait Assistance 4: Min assist    Ambulation/Gait Assistance Details Orthotist, Thayer Ohm, present today to help in assessment of left AFO. Pt ambulated 8' at first without AFO donned with significant issues clearing LLE with left foor drop dragging floor. PT stopped pt and donned left posterior Thusane AFO with 3/8" heel lift. Pt ambulated 115' with min assist with noted left knee recurvatum when not clearing past right foot and poor foot clearance needing PT to assist to advance at times. Sat and switched out shoes to clinic trial shoes with leather toe cap  and ambulated another 4' with better left foot advancement to more step-through pattern. Noted improvement in left knee control with better advancement and more solid shoe. Pt reported it also felt easier.    Ambulation Distance (Feet) 115 Feet   8' x 1, 115' x 2   Assistive device Rollator    Gait Pattern Step-to pattern;Step-through pattern;Decreased hip/knee flexion - left;Decreased dorsiflexion - left;Poor foot clearance - left;Left genu recurvatum;Decreased step length - left    Ambulation Surface Level;Indoor                  PT Education - 03/17/20 1459    Education Details Discussed recommendation of left posterior Thusane AFO with heel lift and leather toe cap. Discussed always having daughter with her right now when getting up due to increased weakness.     Person(s) Educated Patient;Child(ren)    Methods Explanation    Comprehension Verbalized understanding            PT Short Term Goals - 03/17/20 1500      PT SHORT TERM GOAL #1   Title Pt will be able to perform intial HEP for strengthening with assist of daughter.    Time 4    Period Weeks    Status New    Target Date 03/26/20      PT SHORT TERM GOAL #2   Title Pt will be assessed for left AFO need and orthotist consult conducted.    Baseline orthostist consult conducted 03/17/20 with recommendation of left posterior Thusane AFO with heel lift and leather toe cap    Time 4    Period Weeks    Status Achieved    Target Date 03/26/20      PT SHORT TERM GOAL #3   Title Pt will perform all transfers mod I for improved safety and mobility.    Time 4    Period Weeks    Status New    Target Date 03/26/20      PT SHORT TERM GOAL #4   Title Pt will be able to perform bed mobility mod I consistently for improved mobility.    Time 4    Period Weeks    Status New    Target Date 03/26/20      PT SHORT TERM GOAL #5   Title Pt will ambulate 71' with walker and left AFO CGA for short household distances.    Time 4    Period Weeks    Status New    Target Date 03/26/20             PT Long Term Goals - 02/25/20 1941      PT LONG TERM GOAL #1   Title Pt will decrease 5 x sit to stand from 32 sec to <22 sec for improved balance and functional strength.    Baseline 02/25/20 32 sec from chair with hands    Time 8    Period Weeks    Status New    Target Date 04/25/20      PT LONG TERM GOAL #2   Title Pt will be able to ambulate 200' with walker and AFO supervision for improved household mobility and short community distances.    Time 8    Period Weeks    Status New    Target Date 04/25/20      PT LONG TERM GOAL #3   Title Pt will be able to maintain standing >5 min with minimal UE support for improved  standing balance and to assist with ADLs.    Time 8    Period Weeks     Status New    Target Date 04/25/20                 Plan - 03/17/20 1501    Clinical Impression Statement Orthotist present for session today for AFO consult. Pt was weaker today having had Ocrevus infusion yesterday. PT advised that pt should notify Dr. Anne Hahn on response to infusion. Pt did however, do better during session that daughter reporting at home as was able to stand and walk some for assessment. Recommending left posterior Thuasne AFO with heel lift and leather toe tap. Also recommended new sneakers that are firmer around heel and a little wider to allow room for brace and better suport. Pt will continue to need more work on hip strength to help with advancement.    Personal Factors and Comorbidities Comorbidity 3+    Comorbidities depression, headache, HLD    Examination-Activity Limitations Stand;Stairs;Bed Mobility;Transfers;Bathing;Locomotion Level    Examination-Participation Restrictions Community Activity;Cleaning    Stability/Clinical Decision Making Evolving/Moderate complexity    Rehab Potential Good    PT Frequency 2x / week   plus eval   PT Duration 8 weeks    PT Treatment/Interventions ADLs/Self Care Home Management;Gait training;Stair training;Functional mobility training;Therapeutic activities;Therapeutic exercise;Balance training;Neuromuscular re-education;DME Instruction;Manual techniques;Orthotic Fit/Training;Patient/family education;Vestibular;Passive range of motion    PT Next Visit Plan Gait with L Thuasne brace brace. May want to use clinic shoes for toe cap if needed or donn shoe cover.  Work on Software engineer for ambulation, locking/unlocking brackes before sit to stand transfers, turning around and positioning walker when cooking/brushing/prepping food etc. Can we problem solve safer way to get in bathroom as rollator will not fit. Left hip strengthening.    Consulted and Agree with Plan of Care Patient;Family member/caregiver    Family Member Consulted  daughter           Patient will benefit from skilled therapeutic intervention in order to improve the following deficits and impairments:  Abnormal gait,Decreased balance,Decreased activity tolerance,Decreased mobility,Decreased knowledge of use of DME,Decreased endurance,Decreased range of motion,Decreased strength,Impaired vision/preception  Visit Diagnosis: Other abnormalities of gait and mobility  Muscle weakness (generalized)     Problem List Patient Active Problem List   Diagnosis Date Noted  . Hot flashes due to menopause 12/01/2019  . Encounter for gynecological examination with Papanicolaou smear of cervix 04/12/2016  . Constipation 04/12/2016  . Vaginal atrophy 04/12/2016  . Bilateral lower extremity edema with recent DOE 11/02/2015  . Microcytosis 10/16/2014  . Vitamin D insufficiency 04/20/2014  . Abnormality of gait 09/13/2013  . Depression 09/19/2011  . Multiple sclerosis (HCC) 03/06/2011  . Healthcare maintenance 03/06/2011  . Hyperlipidemia 09/17/2010    Ronn Melena, PT, DPT, NCS 03/17/2020, 3:07 PM  [facility] [address] [place], [place], [address] Phone: [phone]   Fax:  [phone]  Name: Kristen Boyle MRN: [id number] Date of Birth: 04-Feb-1960

## 2020-03-18 ENCOUNTER — Telehealth: Payer: Self-pay | Admitting: Neurology

## 2020-03-18 DIAGNOSIS — R350 Frequency of micturition: Secondary | ICD-10-CM

## 2020-03-18 DIAGNOSIS — Z5181 Encounter for therapeutic drug level monitoring: Secondary | ICD-10-CM

## 2020-03-18 NOTE — Telephone Encounter (Signed)
I tried to call the patient, I got no answer, unable to leave a message, I will call back later.

## 2020-03-19 NOTE — Telephone Encounter (Signed)
I called the patient.  Unable to leave a message, the patient did not answer.  I will try to contact her one more time.

## 2020-03-20 NOTE — Telephone Encounter (Signed)
I called patient again, again the patient has not answering the telephone, unable to leave a message, mailbox is full.

## 2020-03-21 ENCOUNTER — Ambulatory Visit: Payer: Medicare Other

## 2020-03-21 NOTE — Telephone Encounter (Signed)
Thank you for trying. She cancelled PT today as was running behind so I called them to check in. Spoke with her daughter who said she was doing a little better but still not back to where she was. I let her know you had tried to call them a couple times so she was going to call your office.

## 2020-03-21 NOTE — Addendum Note (Signed)
Addended by: York Spaniel on: 03/21/2020 12:38 PM   Modules accepted: Orders

## 2020-03-21 NOTE — Telephone Encounter (Signed)
I called and talk with the daughter.  The patient had her second Ocrevus infusion, half dose, on 16 March 2020.  With both infusions, the patient had transient worsening of her clinical functioning level, unable to walk well for 2 or 3 days and then gradually improving.  The patient is following the same pattern now.  She is not running any fevers or chills.  She is eating and drinking well.  I will get her in and check blood work this week, check urinalysis to exclude bladder infection.  She will not get another Ocrevus infusion for about 6 months.  As long as the patient is improving, I will not treat with steroids.

## 2020-03-21 NOTE — Telephone Encounter (Signed)
Please see note from today, I did contact the daughter.  The patient apparently has had similar problems following the first Ocrevus infusion.  We will check blood work and urinalysis.

## 2020-03-21 NOTE — Telephone Encounter (Signed)
Thank you :)

## 2020-03-21 NOTE — Telephone Encounter (Signed)
Pt returned call. She states she will check the VM to make sure it is emptied but will stay near the phone waiting for the phone call back. Pt number to be called back on is [phone]

## 2020-03-23 ENCOUNTER — Ambulatory Visit: Payer: Medicare Other

## 2020-03-23 ENCOUNTER — Other Ambulatory Visit (INDEPENDENT_AMBULATORY_CARE_PROVIDER_SITE_OTHER): Payer: Self-pay

## 2020-03-23 DIAGNOSIS — R350 Frequency of micturition: Secondary | ICD-10-CM

## 2020-03-23 DIAGNOSIS — Z5181 Encounter for therapeutic drug level monitoring: Secondary | ICD-10-CM | POA: Diagnosis not present

## 2020-03-23 DIAGNOSIS — Z0289 Encounter for other administrative examinations: Secondary | ICD-10-CM

## 2020-03-24 ENCOUNTER — Telehealth: Payer: Self-pay | Admitting: Neurology

## 2020-03-24 ENCOUNTER — Ambulatory Visit: Payer: Medicare Other

## 2020-03-24 LAB — CBC WITH DIFFERENTIAL/PLATELET
Basophils Absolute: 0 10*3/uL (ref 0.0–0.2)
Basos: 1 %
EOS (ABSOLUTE): 0.1 10*3/uL (ref 0.0–0.4)
Eos: 4 %
Hematocrit: 30.3 % — ABNORMAL LOW (ref 34.0–46.6)
Hemoglobin: 8.7 g/dL — ABNORMAL LOW (ref 11.1–15.9)
Immature Grans (Abs): 0 10*3/uL (ref 0.0–0.1)
Immature Granulocytes: 0 %
Lymphocytes Absolute: 2.2 10*3/uL (ref 0.7–3.1)
Lymphs: 63 %
MCH: 22.1 pg — ABNORMAL LOW (ref 26.6–33.0)
MCHC: 28.7 g/dL — ABNORMAL LOW (ref 31.5–35.7)
MCV: 77 fL — ABNORMAL LOW (ref 79–97)
Monocytes Absolute: 0.3 10*3/uL (ref 0.1–0.9)
Monocytes: 9 %
Neutrophils Absolute: 0.8 10*3/uL — ABNORMAL LOW (ref 1.4–7.0)
Neutrophils: 23 %
Platelets: 401 10*3/uL (ref 150–450)
RBC: 3.93 x10E6/uL (ref 3.77–5.28)
RDW: 15.5 % — ABNORMAL HIGH (ref 11.7–15.4)
WBC: 3.5 10*3/uL (ref 3.4–10.8)

## 2020-03-24 LAB — COMPREHENSIVE METABOLIC PANEL
ALT: 19 IU/L (ref 0–32)
AST: 18 IU/L (ref 0–40)
Albumin/Globulin Ratio: 1.5 (ref 1.2–2.2)
Albumin: 4.4 g/dL (ref 3.8–4.8)
Alkaline Phosphatase: 92 IU/L (ref 44–121)
BUN/Creatinine Ratio: 11 — ABNORMAL LOW (ref 12–28)
BUN: 10 mg/dL (ref 8–27)
Bilirubin Total: 0.2 mg/dL (ref 0.0–1.2)
CO2: 21 mmol/L (ref 20–29)
Calcium: 9.6 mg/dL (ref 8.7–10.3)
Chloride: 107 mmol/L — ABNORMAL HIGH (ref 96–106)
Creatinine, Ser: 0.87 mg/dL (ref 0.57–1.00)
Globulin, Total: 3 g/dL (ref 1.5–4.5)
Glucose: 104 mg/dL — ABNORMAL HIGH (ref 65–99)
Potassium: 3.8 mmol/L (ref 3.5–5.2)
Sodium: 143 mmol/L (ref 134–144)
Total Protein: 7.4 g/dL (ref 6.0–8.5)
eGFR: 76 mL/min/{1.73_m2} (ref >59)

## 2020-03-24 LAB — URINALYSIS, ROUTINE W REFLEX MICROSCOPIC
Bilirubin, UA: NEGATIVE
Glucose, UA: NEGATIVE
Ketones, UA: NEGATIVE
Leukocytes,UA: NEGATIVE
Nitrite, UA: NEGATIVE
Protein,UA: NEGATIVE
RBC, UA: NEGATIVE
Specific Gravity, UA: 1.011 (ref 1.005–1.030)
Urobilinogen, Ur: 0.2 mg/dL (ref 0.2–1.0)
pH, UA: 5.5 (ref 5.0–7.5)

## 2020-03-24 MED ORDER — FERROUS GLUCONATE 324 (38 FE) MG PO TABS: 324.0000 mg | ORAL_TABLET | Freq: Every day | ORAL | 3 refills | Status: DC

## 2020-03-24 NOTE — Telephone Encounter (Signed)
I called the patient.  Blood work reveals a fairly normal chemistry profile, slightly elevated chloride level.  Not clinically significant.  Urinalysis is unremarkable, no evidence of urinary tract infection.  CBC however shows evidence of evolution of what looks like a iron deficiency anemia that has occurred over the last 2 months.  Source of this is not clear.  If the patient is having dark stools, she is to contact her primary care physician.  I will send in a prescription for iron, recheck blood work in 10 days.  I will send the blood work results to the primary care physician.

## 2020-03-27 NOTE — Telephone Encounter (Signed)
Thank you for forwarding this to me. I will have her scheduled for an appointment at our clinic in about 1 week to address this.

## 2020-03-31 ENCOUNTER — Encounter: Payer: Self-pay | Admitting: Student

## 2020-03-31 ENCOUNTER — Ambulatory Visit: Payer: Medicare Other | Attending: Internal Medicine

## 2020-03-31 ENCOUNTER — Other Ambulatory Visit: Payer: Self-pay

## 2020-03-31 DIAGNOSIS — M6281 Muscle weakness (generalized): Secondary | ICD-10-CM | POA: Insufficient documentation

## 2020-03-31 DIAGNOSIS — R2689 Other abnormalities of gait and mobility: Secondary | ICD-10-CM | POA: Insufficient documentation

## 2020-03-31 NOTE — Patient Instructions (Signed)
Access Code: [email or web address] URL: [email or web address] Date: 03/31/2020 Prepared by: Elmer Bales  Exercises Supine Lower Trunk Rotation - 1 x daily - 5 x weekly - 1 sets - 10 reps - 10 seconds hold Hooklying Gluteal Sets - 1 x daily - 5 x weekly - 1 sets - 10 reps - 3 hold Supine Bridge - 1 x daily - 5 x weekly - 1 sets - 10 reps Clamshell - 1 x daily - 5 x weekly - 1 sets - 10 reps Supine Heel Slide - 1 x daily - 5 x weekly - 1 sets - 10 reps Sit to Stand with Armchair - 1 x daily - 5 x weekly - 2 sets - 10 reps Seated Heel Slide - 1 x daily - 5 x weekly - 2 sets - 5 reps

## 2020-03-31 NOTE — Telephone Encounter (Signed)
Unable to reach the patient by phone a letter has been mailed to the patient to call the office and sch an appt.

## 2020-03-31 NOTE — Therapy (Signed)
[facility] [address] [place], [place], [address] Phone: [phone]   Fax:  [phone]  Physical Therapy Treatment  Patient Details  Name: ABBIE Boyle MRN: [id number] Date of Birth: 1960/05/25 Referring Provider (PT): Charissa Bash referred but Tonye Royalty is PCP. Neurologist is Dr. Anne Hahn   Encounter Date: 03/31/2020   PT End of Session - 03/31/20 1410    Visit Number 7    Number of Visits 17    Date for PT Re-Evaluation 05/25/20   90 day cert, 60 day poc   Authorization Type medicare/medicaid, 10th visit progress note    PT Start Time 1406    PT Stop Time 1453    PT Time Calculation (min) 47 min    Equipment Utilized During Treatment Gait belt    Activity Tolerance Patient tolerated treatment well    Behavior During Therapy WFL for tasks assessed/performed           Past Medical History:  Diagnosis Date  . Abnormality of gait 06/10/2012  . Depression   . Headache   . Hyperlipidemia   . MS (multiple sclerosis) (HCC)     Past Surgical History:  Procedure Laterality Date  . CHOLECYSTECTOMY N/A 06/20/2015   Procedure: LAPAROSCOPIC CHOLECYSTECTOMY;  Surgeon: Axel Filler, MD;  Location: [facility];  Service: General;  Laterality: N/A;  . DENTAL SURGERY    . TUBAL LIGATION Bilateral     There were no vitals filed for this visit.   Subjective Assessment - 03/31/20 1410    Subjective Pt's reports that she just picked up the iron pills and will start tomorrow. Her bloodwork found hemoglobin was low at 8.2. Feeling better than at last visit and has been walking some at home. Pt went to [facility] and did the anti gravity treadmill for about 4 minutes the other day to try out.    Patient is accompained by: Family member   daughter   Pertinent History PMH: depression, headache, HLD    Patient Stated Goals Pt would like to be able to walk more and get out of house some without always being in the  chair.    Currently in Pain? No/denies                             [facility] Adult PT Treatment/Exercise - 03/31/20 1412      Ambulation/Gait   Ambulation/Gait Yes    Ambulation/Gait Assistance 4: Min guard    Ambulation/Gait Assistance Details Pt was cued to try to increase left hip flexion. Pt with better foot clearance today and step length. Her soft low, sneakers do not help control at ankle as well with rolling out some in to supination with hyperextension at left knee during stance.    Ambulation Distance (Feet) 230 Feet    Assistive device Rollator   left posterior Thuanse AFO   Gait Pattern Step-through pattern;Decreased hip/knee flexion - left;Left genu recurvatum;Decreased step length - right;Decreased step length - left    Ambulation Surface Level;Indoor      Therapeutic Activites    Therapeutic Activities Other Therapeutic Activities    Other Therapeutic Activities PT discussed bathroom set up with pt and daughter to try to problem solve how to get in and out easiser. Pt can not get rollator in bathroom. Sink is on right when enter and door opens to left with wall on left side. Toilet at end of sink and shower  beside toilet. PT asked daughter to measure width of doorway and take a picture for use to look at further next time. Wondering if RW would fit through. Also discussed possible grab bar options.      Neuro Re-ed    Neuro Re-ed Details  Sit to stand x 5 from mat with cues not to pull on walker but push lightly from mat if needed. Pt pulling right foot back further with decreased left weight shift. PT placed 2" step under right foot to try to further facilitate left weight shift and performed sit to stand x 5 with tactile cues for weight shift. Pt does lock out left knee at end. Standing at walker: tapping 2" step with LLE x 5 with PT helping to facilitate at pelvis and cues to try to lift leg back off. Pt had more difficulty bringing leg back off step.       Exercises   Exercises Other Exercises    Other Exercises  Seated edge of mat: left heel slides with foam roll under left foot 10 x 2. Supine bridges x 10 with CGA at left leg for stability, left heel slides x 10 min assist to get started and verbal cues to try to slide up a little higher each time. Attempted supine march but pt not able to lift leg from this position. Removed that from HEP.                  PT Education - 03/31/20 1518    Education Details PT updated HEP and reissued.            PT Short Term Goals - 03/17/20 1500      PT SHORT TERM GOAL #1   Title Pt will be able to perform intial HEP for strengthening with assist of daughter.    Time 4    Period Weeks    Status New    Target Date 03/26/20      PT SHORT TERM GOAL #2   Title Pt will be assessed for left AFO need and orthotist consult conducted.    Baseline orthostist consult conducted 03/17/20 with recommendation of left posterior Thusane AFO with heel lift and leather toe cap    Time 4    Period Weeks    Status Achieved    Target Date 03/26/20      PT SHORT TERM GOAL #3   Title Pt will perform all transfers mod I for improved safety and mobility.    Time 4    Period Weeks    Status New    Target Date 03/26/20      PT SHORT TERM GOAL #4   Title Pt will be able to perform bed mobility mod I consistently for improved mobility.    Time 4    Period Weeks    Status New    Target Date 03/26/20      PT SHORT TERM GOAL #5   Title Pt will ambulate 41' with walker and left AFO CGA for short household distances.    Time 4    Period Weeks    Status New    Target Date 03/26/20             PT Long Term Goals - 02/25/20 1941      PT LONG TERM GOAL #1   Title Pt will decrease 5 x sit to stand from 32 sec to <22 sec for improved balance and functional strength.    Baseline  02/25/20 32 sec from chair with hands    Time 8    Period Weeks    Status New    Target Date 04/25/20      PT LONG TERM GOAL  #2   Title Pt will be able to ambulate 200' with walker and AFO supervision for improved household mobility and short community distances.    Time 8    Period Weeks    Status New    Target Date 04/25/20      PT LONG TERM GOAL #3   Title Pt will be able to maintain standing >5 min with minimal UE support for improved standing balance and to assist with ADLs.    Time 8    Period Weeks    Status New    Target Date 04/25/20                 Plan - 03/31/20 1523    Clinical Impression Statement Pt had improved ability to advance LLE at visit today. Continued to work on gait twith left posterior Thuasne AFO. Pt awaiting her AFO from [employer]. Tried to problem solve how to safely get in bathroom as well as rollator will not fit.    Personal Factors and Comorbidities Comorbidity 3+    Comorbidities depression, headache, HLD    Examination-Activity Limitations Stand;Stairs;Bed Mobility;Transfers;Bathing;Locomotion Level    Examination-Participation Restrictions Community Activity;Cleaning    Stability/Clinical Decision Making Evolving/Moderate complexity    Rehab Potential Good    PT Frequency 2x / week   plus eval   PT Duration 8 weeks    PT Treatment/Interventions ADLs/Self Care Home Management;Gait training;Stair training;Functional mobility training;Therapeutic activities;Therapeutic exercise;Balance training;Neuromuscular re-education;DME Instruction;Manual techniques;Orthotic Fit/Training;Patient/family education;Vestibular;Passive range of motion    PT Next Visit Plan Gait with L Thuasne brace brace. Awaiting her AFO from [employer] when ready. They are also going to do a heel lift.  Work on Software engineer for ambulation, locking/unlocking bracks before sit to stand transfers.  Did daughter bring in measurements of doorway in bathroom and picture for use to look at? Wondering if RW would fit or not. Possible grabe bar need.  Left hip strengthening, standing balance.    Consulted and Agree  with Plan of Care Patient;Family member/caregiver    Family Member Consulted daughter           Patient will benefit from skilled therapeutic intervention in order to improve the following deficits and impairments:  Abnormal gait,Decreased balance,Decreased activity tolerance,Decreased mobility,Decreased knowledge of use of DME,Decreased endurance,Decreased range of motion,Decreased strength,Impaired vision/preception  Visit Diagnosis: Other abnormalities of gait and mobility  Muscle weakness (generalized)     Problem List Patient Active Problem List   Diagnosis Date Noted  . Hot flashes due to menopause 12/01/2019  . Encounter for gynecological examination with Papanicolaou smear of cervix 04/12/2016  . Constipation 04/12/2016  . Vaginal atrophy 04/12/2016  . Bilateral lower extremity edema with recent DOE 11/02/2015  . Microcytosis 10/16/2014  . Vitamin D insufficiency 04/20/2014  . Abnormality of gait 09/13/2013  . Depression 09/19/2011  . Multiple sclerosis (HCC) 03/06/2011  . Healthcare maintenance 03/06/2011  . Hyperlipidemia 09/17/2010    Ronn Melena, PT, DPT, NCS 03/31/2020, 3:32 PM  [facility] [address] [place], [place], [address] Phone: [phone]   Fax:  [phone]  Name: Kristen Boyle MRN: [id number] Date of Birth: May 08, 1960

## 2020-04-03 ENCOUNTER — Telehealth: Payer: Self-pay | Admitting: Emergency Medicine

## 2020-04-03 ENCOUNTER — Ambulatory Visit: Payer: Medicare Other

## 2020-04-03 NOTE — Telephone Encounter (Signed)
Faxed prescription/letter/certificate of medical necessity for patient to [facility].  OK transmission received.

## 2020-04-04 ENCOUNTER — Telehealth: Payer: Self-pay | Admitting: Neurology

## 2020-04-04 DIAGNOSIS — D500 Iron deficiency anemia secondary to blood loss (chronic): Secondary | ICD-10-CM

## 2020-04-04 NOTE — Telephone Encounter (Signed)
I called the patient.  The patient has not yet heard from her primary care physician regarding follow-up.  I will check a CBC this week and compared to the prior study done.  She is now on iron therapy.

## 2020-04-06 ENCOUNTER — Other Ambulatory Visit (INDEPENDENT_AMBULATORY_CARE_PROVIDER_SITE_OTHER): Payer: Self-pay

## 2020-04-06 ENCOUNTER — Ambulatory Visit: Payer: Medicare Other

## 2020-04-06 ENCOUNTER — Other Ambulatory Visit: Payer: Self-pay

## 2020-04-06 DIAGNOSIS — R2689 Other abnormalities of gait and mobility: Secondary | ICD-10-CM | POA: Diagnosis not present

## 2020-04-06 DIAGNOSIS — D500 Iron deficiency anemia secondary to blood loss (chronic): Secondary | ICD-10-CM | POA: Diagnosis not present

## 2020-04-06 DIAGNOSIS — M6281 Muscle weakness (generalized): Secondary | ICD-10-CM

## 2020-04-06 DIAGNOSIS — Z0289 Encounter for other administrative examinations: Secondary | ICD-10-CM

## 2020-04-06 NOTE — Therapy (Signed)
[facility] [address] [place], [place], [address] Phone: [phone]   Fax:  [phone]  Physical Therapy Treatment  Patient Details  Name: Kristen Boyle MRN: [id number] Date of Birth: 03-Aug-1960 Referring Provider (PT): Charissa Bash referred but Tonye Royalty is PCP. Neurologist is Dr. Anne Hahn   Encounter Date: 04/06/2020   PT End of Session - 04/06/20 1109    Visit Number 8    Number of Visits 17    Date for PT Re-Evaluation 05/25/20   90 day cert, 60 day poc   Authorization Type medicare/medicaid, 10th visit progress note    PT Start Time 1105    PT Stop Time 1148    PT Time Calculation (min) 43 min    Equipment Utilized During Treatment Gait belt    Activity Tolerance Patient tolerated treatment well    Behavior During Therapy WFL for tasks assessed/performed           Past Medical History:  Diagnosis Date  . Abnormality of gait 06/10/2012  . Depression   . Headache   . Hyperlipidemia   . MS (multiple sclerosis) (HCC)     Past Surgical History:  Procedure Laterality Date  . CHOLECYSTECTOMY N/A 06/20/2015   Procedure: LAPAROSCOPIC CHOLECYSTECTOMY;  Surgeon: Axel Filler, MD;  Location: [facility];  Service: General;  Laterality: N/A;  . DENTAL SURGERY    . TUBAL LIGATION Bilateral     There were no vitals filed for this visit.   Subjective Assessment - 04/06/20 1109    Subjective Pt walked in to therapy today with her rollator with less left foot drag. Reports she has been feeling some better. Still waiting to get in with PCP but has been taking the iron. Daughter forgot to measure the bathroom doorway but will for next time.    Patient is accompained by: Family member   daughter   Pertinent History PMH: depression, headache, HLD    Patient Stated Goals Pt would like to be able to walk more and get out of house some without always being in the chair.    Currently in Pain? No/denies                              [facility] Adult PT Treatment/Exercise - 04/06/20 1112      Transfers   Transfers Sit to Stand;Stand to Sit    Sit to Stand 4: Min guard    Stand to Sit 4: Min guard    Comments Pt performed sit to stand x 5 from mat with verbal cues for hand placement and to try to shift weight to left a little more. Repeated with 2" step under right foot to further facilitate left weight shift x 5 with mirror in front as well for visual cues.      Ambulation/Gait   Ambulation/Gait Yes    Ambulation/Gait Assistance 4: Min guard    Ambulation/Gait Assistance Details Pt was cued to try to increase left hip flexion. Ambulated in to clinic without brace with decreased DF but not tripping over foot like prior. Utilized left posterior Thuasne AFO during session. Pt does continues to have recurvatum with current shoes being very flimsy. Better foot clearance with AFO. Pt was tired towards end of gait.    Ambulation Distance (Feet) 230 Feet    Assistive device Rollator    Gait Pattern Step-through pattern;Decreased dorsiflexion - left;Decreased hip/knee flexion - left;Left genu recurvatum  Ambulation Surface Level;Indoor      Neuro Re-ed    Neuro Re-ed Details  Standing in front of mirror: working on equalizing weight shift with shifting to left more with tactile cues. Also cued to try to keep slight bend in left knee with PT stabilizing around knee for safety. Reaching for targets in varied directions x 1 min each hand then left hip bumps to therapist's hand to shift over LLE more x 10.      Exercises   Exercises Other Exercises    Other Exercises  Supine bridges 10 x 2 with verbal and tactile cues to keep knees slightly apart and pelvis level when rising, left knee to chest with leg on red physioball 10 x 2 with PT stabilizing to keep foot on ball and hip in neutral. Supine left heel slides x 10 with CGA/min assist support under heel.                  PT Education -  04/06/20 1206    Education Details Discussed plan to add more visits through April.    Person(s) Educated Patient    Methods Explanation    Comprehension Verbalized understanding            PT Short Term Goals - 03/17/20 1500      PT SHORT TERM GOAL #1   Title Pt will be able to perform intial HEP for strengthening with assist of daughter.    Time 4    Period Weeks    Status New    Target Date 03/26/20      PT SHORT TERM GOAL #2   Title Pt will be assessed for left AFO need and orthotist consult conducted.    Baseline orthostist consult conducted 03/17/20 with recommendation of left posterior Thusane AFO with heel lift and leather toe cap    Time 4    Period Weeks    Status Achieved    Target Date 03/26/20      PT SHORT TERM GOAL #3   Title Pt will perform all transfers mod I for improved safety and mobility.    Time 4    Period Weeks    Status New    Target Date 03/26/20      PT SHORT TERM GOAL #4   Title Pt will be able to perform bed mobility mod I consistently for improved mobility.    Time 4    Period Weeks    Status New    Target Date 03/26/20      PT SHORT TERM GOAL #5   Title Pt will ambulate 31' with walker and left AFO CGA for short household distances.    Time 4    Period Weeks    Status New    Target Date 03/26/20             PT Long Term Goals - 02/25/20 1941      PT LONG TERM GOAL #1   Title Pt will decrease 5 x sit to stand from 32 sec to <22 sec for improved balance and functional strength.    Baseline 02/25/20 32 sec from chair with hands    Time 8    Period Weeks    Status New    Target Date 04/25/20      PT LONG TERM GOAL #2   Title Pt will be able to ambulate 200' with walker and AFO supervision for improved household mobility and short community distances.  Time 8    Period Weeks    Status New    Target Date 04/25/20      PT LONG TERM GOAL #3   Title Pt will be able to maintain standing >5 min with minimal UE support for  improved standing balance and to assist with ADLs.    Time 8    Period Weeks    Status New    Target Date 04/25/20                 Plan - 04/06/20 1206    Clinical Impression Statement Pt continues to show improving left foot clearance with gait. Worked on [employer] activities to try to facilitate more left weight shift.    Personal Factors and Comorbidities Comorbidity 3+    Comorbidities depression, headache, HLD    Examination-Activity Limitations Stand;Stairs;Bed Mobility;Transfers;Bathing;Locomotion Level    Examination-Participation Restrictions Community Activity;Cleaning    Stability/Clinical Decision Making Evolving/Moderate complexity    Rehab Potential Good    PT Frequency 2x / week   plus eval   PT Duration 8 weeks    PT Treatment/Interventions ADLs/Self Care Home Management;Gait training;Stair training;Functional mobility training;Therapeutic activities;Therapeutic exercise;Balance training;Neuromuscular re-education;DME Instruction;Manual techniques;Orthotic Fit/Training;Patient/family education;Vestibular;Passive range of motion    PT Next Visit Plan Gait with L Thuasne brace brace. Awaiting her AFO from [employer] when ready. They are also going to do a heel lift.  Work on Software engineer for ambulation, locking/unlocking bracks before sit to stand transfers.  Did daughter bring in measurements of doorway in bathroom and picture for use to look at? Wondering if RW would fit or not. Possible grab bar need.  Left hip strengthening, standing balance.    Consulted and Agree with Plan of Care Patient;Family member/caregiver    Family Member Consulted daughter           Patient will benefit from skilled therapeutic intervention in order to improve the following deficits and impairments:  Abnormal gait,Decreased balance,Decreased activity tolerance,Decreased mobility,Decreased knowledge of use of DME,Decreased endurance,Decreased range of motion,Decreased strength,Impaired  vision/preception  Visit Diagnosis: Other abnormalities of gait and mobility  Muscle weakness (generalized)     Problem List Patient Active Problem List   Diagnosis Date Noted  . Hot flashes due to menopause 12/01/2019  . Encounter for gynecological examination with Papanicolaou smear of cervix 04/12/2016  . Constipation 04/12/2016  . Vaginal atrophy 04/12/2016  . Bilateral lower extremity edema with recent DOE 11/02/2015  . Microcytosis 10/16/2014  . Vitamin D insufficiency 04/20/2014  . Abnormality of gait 09/13/2013  . Depression 09/19/2011  . Multiple sclerosis (HCC) 03/06/2011  . Healthcare maintenance 03/06/2011  . Hyperlipidemia 09/17/2010    Ronn Melena, PT, DPT, NCS 04/06/2020, 12:08 PM  [facility] [address] [place], [place], [address] Phone: [phone]   Fax:  [phone]  Name: BRUNILDA EBLE MRN: [id number] Date of Birth: [birth year]

## 2020-04-07 LAB — CBC WITH DIFFERENTIAL/PLATELET
Basophils Absolute: 0.1 10*3/uL (ref 0.0–0.2)
Basos: 2 %
EOS (ABSOLUTE): 0.1 10*3/uL (ref 0.0–0.4)
Eos: 3 %
Hematocrit: 32.8 % — ABNORMAL LOW (ref 34.0–46.6)
Hemoglobin: 9.6 g/dL — ABNORMAL LOW (ref 11.1–15.9)
Immature Grans (Abs): 0 10*3/uL (ref 0.0–0.1)
Immature Granulocytes: 0 %
Lymphocytes Absolute: 1.6 10*3/uL (ref 0.7–3.1)
Lymphs: 52 %
MCH: 22.1 pg — ABNORMAL LOW (ref 26.6–33.0)
MCHC: 29.3 g/dL — ABNORMAL LOW (ref 31.5–35.7)
MCV: 75 fL — ABNORMAL LOW (ref 79–97)
Monocytes Absolute: 0.3 10*3/uL (ref 0.1–0.9)
Monocytes: 10 %
Neutrophils Absolute: 1 10*3/uL — ABNORMAL LOW (ref 1.4–7.0)
Neutrophils: 33 %
Platelets: 317 10*3/uL (ref 150–450)
RBC: 4.35 x10E6/uL (ref 3.77–5.28)
RDW: 16.2 % — ABNORMAL HIGH (ref 11.7–15.4)
WBC: 3.1 10*3/uL — ABNORMAL LOW (ref 3.4–10.8)

## 2020-04-07 LAB — FERRITIN: Ferritin: 18 ng/mL (ref 15–150)

## 2020-04-07 LAB — IRON AND TIBC
Iron Saturation: 8 % — CL (ref 15–55)
Iron: 37 ug/dL (ref 27–139)
Total Iron Binding Capacity: 436 ug/dL (ref 250–450)
UIBC: 399 ug/dL — ABNORMAL HIGH (ref 118–369)

## 2020-04-10 ENCOUNTER — Ambulatory Visit: Payer: Medicare Other | Admitting: Physical Therapy

## 2020-04-12 DIAGNOSIS — Z20822 Contact with and (suspected) exposure to covid-19: Secondary | ICD-10-CM | POA: Diagnosis not present

## 2020-04-13 ENCOUNTER — Other Ambulatory Visit: Payer: Self-pay

## 2020-04-13 ENCOUNTER — Ambulatory Visit: Payer: Medicare Other

## 2020-04-13 VITALS — BP 118/78

## 2020-04-13 DIAGNOSIS — M6281 Muscle weakness (generalized): Secondary | ICD-10-CM | POA: Diagnosis not present

## 2020-04-13 DIAGNOSIS — R2689 Other abnormalities of gait and mobility: Secondary | ICD-10-CM

## 2020-04-13 NOTE — Therapy (Signed)
[facility] [address] [place], [place], [address] Phone: [phone]   Fax:  [phone]  Physical Therapy Treatment  Patient Details  Name: Kristen Boyle MRN: [id number] Date of Birth: 1960-08-05 Referring Provider (PT): Charissa Bash referred but Tonye Royalty is PCP. Neurologist is Dr. Anne Hahn   Encounter Date: 04/13/2020   PT End of Session - 04/13/20 1405    Visit Number 9    Number of Visits 17    Date for PT Re-Evaluation 05/25/20   90 day cert, 60 day poc   Authorization Type medicare/medicaid, 10th visit progress note    PT Start Time 1402    PT Stop Time 1443    PT Time Calculation (min) 41 min    Equipment Utilized During Treatment Gait belt    Activity Tolerance Patient tolerated treatment well    Behavior During Therapy WFL for tasks assessed/performed           Past Medical History:  Diagnosis Date  . Abnormality of gait 06/10/2012  . Depression   . Headache   . Hyperlipidemia   . MS (multiple sclerosis) (HCC)     Past Surgical History:  Procedure Laterality Date  . CHOLECYSTECTOMY N/A 06/20/2015   Procedure: LAPAROSCOPIC CHOLECYSTECTOMY;  Surgeon: Axel Filler, MD;  Location: [facility];  Service: General;  Laterality: N/A;  . DENTAL SURGERY    . TUBAL LIGATION Bilateral     Vitals:   04/13/20 1407  BP: 118/78     Subjective Assessment - 04/13/20 1406    Subjective Pt reports that she has been feeling a little lightheaded off and on for some time. She got blood work the other day and her iron levels are improving. Daughter measured the bathroom door way and is 22".    Patient is accompained by: Family member   daughter   Pertinent History PMH: depression, headache, HLD    Patient Stated Goals Pt would like to be able to walk more and get out of house some without always being in the chair.    Currently in Pain? No/denies                             [facility] Adult  PT Treatment/Exercise - 04/13/20 1411      Ambulation/Gait   Ambulation/Gait Yes    Ambulation/Gait Assistance 5: Supervision;4: Min guard    Ambulation/Gait Assistance Details Pt was cued to try to increase left hip flexion. Pt had clinic shoes with toe caps donned and left posterior Thuasne AFO.    Ambulation Distance (Feet) 230 Feet   115' x 1   Assistive device Rollator    Gait Pattern Step-through pattern;Decreased hip/knee flexion - left;Poor foot clearance - left;Left genu recurvatum    Ambulation Surface Level;Indoor      Neuro Re-ed    Neuro Re-ed Details  At counter: sidestepping along counter to mimic using sink to get in bathroom 8' x 4. Pt was more challenged going to left. Gait with 1 hand on counter 1 HHA forwards then posterior gait 8' x 2 each direction CGA.                  PT Education - 04/13/20 1837    Education Details PT discussed option of bedside commode for over toilet and that she could also use at night for improved safety.    Person(s) Educated Patient;Child(ren)    Methods Explanation  Comprehension Verbalized understanding            PT Short Term Goals - 04/13/20 1840      PT SHORT TERM GOAL #1   Title Pt will be able to perform intial HEP for strengthening with assist of daughter.    Baseline Pt has been performing initial HEP    Time 4    Period Weeks    Status Achieved    Target Date 03/26/20      PT SHORT TERM GOAL #2   Title Pt will be assessed for left AFO need and orthotist consult conducted.    Baseline orthostist consult conducted 03/17/20 with recommendation of left posterior Thusane AFO with heel lift and leather toe cap    Time 4    Period Weeks    Status Achieved    Target Date 03/26/20      PT SHORT TERM GOAL #3   Title Pt will perform all transfers mod I for improved safety and mobility.    Baseline supervision with transfers    Time 4    Period Weeks    Status On-going    Target Date 03/26/20      PT SHORT  TERM GOAL #4   Title Pt will be able to perform bed mobility mod I consistently for improved mobility.    Baseline supervision/CGA    Time 4    Period Weeks    Status On-going    Target Date 03/26/20      PT SHORT TERM GOAL #5   Title Pt will ambulate 24' with walker and left AFO CGA for short household distances.    Baseline 230' supervision/CGA with left AFO and walker    Time 4    Period Weeks    Status Achieved    Target Date 03/26/20             PT Long Term Goals - 02/25/20 1941      PT LONG TERM GOAL #1   Title Pt will decrease 5 x sit to stand from 32 sec to <22 sec for improved balance and functional strength.    Baseline 02/25/20 32 sec from chair with hands    Time 8    Period Weeks    Status New    Target Date 04/25/20      PT LONG TERM GOAL #2   Title Pt will be able to ambulate 200' with walker and AFO supervision for improved household mobility and short community distances.    Time 8    Period Weeks    Status New    Target Date 04/25/20      PT LONG TERM GOAL #3   Title Pt will be able to maintain standing >5 min with minimal UE support for improved standing balance and to assist with ADLs.    Time 8    Period Weeks    Status New    Target Date 04/25/20                 Plan - 04/13/20 1842    Clinical Impression Statement Pt was able to further increase gait distance. Continued to problem solve bathroom situation. Side stepping along sink so she can hold with both hands or using bsc seem to be safest options at this time. Pt wants to think about bsc option.    Personal Factors and Comorbidities Comorbidity 3+    Comorbidities depression, headache, HLD    Examination-Activity Limitations Stand;Stairs;Bed Mobility;Transfers;Bathing;Locomotion Level  Examination-Participation Restrictions Community Activity;Cleaning    Stability/Clinical Decision Making Evolving/Moderate complexity    Rehab Potential Good    PT Frequency 2x / week   plus eval    PT Duration 8 weeks    PT Treatment/Interventions ADLs/Self Care Home Management;Gait training;Stair training;Functional mobility training;Therapeutic activities;Therapeutic exercise;Balance training;Neuromuscular re-education;DME Instruction;Manual techniques;Orthotic Fit/Training;Patient/family education;Vestibular;Passive range of motion    PT Next Visit Plan 10th visit progress note. Gait with L Thuasne brace brace. Awaiting her AFO from [employer] when ready. They are also going to do a heel lift.  Work on Software engineer for ambulation, locking/unlocking bracks before sit to stand transfers. Bathroom doorway is 22" as daughter measured it. Continue to work on how to safely get in with using sink that is on right.  Left hip strengthening, standing balance. 04/13/20 visit is week 6. Will need to push LTG dates out a week.    Consulted and Agree with Plan of Care Patient;Family member/caregiver    Family Member Consulted daughter           Patient will benefit from skilled therapeutic intervention in order to improve the following deficits and impairments:  Abnormal gait,Decreased balance,Decreased activity tolerance,Decreased mobility,Decreased knowledge of use of DME,Decreased endurance,Decreased range of motion,Decreased strength,Impaired vision/preception  Visit Diagnosis: Other abnormalities of gait and mobility  Muscle weakness (generalized)     Problem List Patient Active Problem List   Diagnosis Date Noted  . Hot flashes due to menopause 12/01/2019  . Encounter for gynecological examination with Papanicolaou smear of cervix 04/12/2016  . Constipation 04/12/2016  . Vaginal atrophy 04/12/2016  . Bilateral lower extremity edema with recent DOE 11/02/2015  . Microcytosis 10/16/2014  . Vitamin D insufficiency 04/20/2014  . Abnormality of gait 09/13/2013  . Depression 09/19/2011  . Multiple sclerosis (HCC) 03/06/2011  . Healthcare maintenance 03/06/2011  . Hyperlipidemia  09/17/2010    Ronn Melena, PT, DPT, NCS 04/13/2020, 6:49 PM  [facility] [address] [place], [place], [address] Phone: [phone]   Fax:  [phone]  Name: Kristen Boyle MRN: [id number] Date of Birth: 1960-12-30

## 2020-04-14 ENCOUNTER — Ambulatory Visit: Payer: Medicare Other

## 2020-04-17 ENCOUNTER — Ambulatory Visit: Payer: Medicare Other

## 2020-04-18 ENCOUNTER — Ambulatory Visit: Payer: Medicare Other

## 2020-04-21 ENCOUNTER — Ambulatory Visit: Payer: Medicare Other

## 2020-04-25 ENCOUNTER — Ambulatory Visit: Payer: Medicare Other

## 2020-04-27 ENCOUNTER — Ambulatory Visit: Payer: Medicare Other | Attending: Internal Medicine

## 2020-04-27 ENCOUNTER — Other Ambulatory Visit: Payer: Self-pay

## 2020-04-27 DIAGNOSIS — R2689 Other abnormalities of gait and mobility: Secondary | ICD-10-CM | POA: Insufficient documentation

## 2020-04-27 DIAGNOSIS — M6281 Muscle weakness (generalized): Secondary | ICD-10-CM | POA: Insufficient documentation

## 2020-04-27 DIAGNOSIS — R2681 Unsteadiness on feet: Secondary | ICD-10-CM | POA: Insufficient documentation

## 2020-04-27 NOTE — Therapy (Signed)
[facility] [address] [place], [place], [address] Phone: [phone]   Fax:  [phone]  Physical Therapy Treatment/Progress note  Patient Details  Name: BRYA SIMERLY MRN: [id number] Date of Birth: 1960/08/06 Referring Provider (PT): Dorian Pod referred but Edison Simon is PCP. Neurologist is Dr. Jannifer Franklin    Progress Note  Reporting period 02/25/20 to 04/27/20  See Note below for Objective Data and Assessment of Progress/Goals  Encounter Date: 04/27/2020   PT End of Session - 04/27/20 1151    Visit Number 10    Number of Visits 17    Date for PT Re-Evaluation 96/78/93   90 day cert, 60 day poc   Authorization Type medicare/medicaid, 10th visit progress note    PT Start Time 1149    PT Stop Time 1227    PT Time Calculation (min) 38 min    Equipment Utilized During Treatment Gait belt    Activity Tolerance Patient tolerated treatment well    Behavior During Therapy WFL for tasks assessed/performed           Past Medical History:  Diagnosis Date  . Abnormality of gait 06/10/2012  . Depression   . Headache   . Hyperlipidemia   . MS (multiple sclerosis) ([place])     Past Surgical History:  Procedure Laterality Date  . CHOLECYSTECTOMY N/A 06/20/2015   Procedure: LAPAROSCOPIC CHOLECYSTECTOMY;  Surgeon: Ralene Ok, MD;  Location: [facility];  Service: General;  Laterality: N/A;  . DENTAL SURGERY    . TUBAL LIGATION Bilateral     There were no vitals filed for this visit.   Subjective Assessment - 04/27/20 1151    Subjective Pt reports that she enjoyed the cruise. She used w/c most of time. Did a little walking in room. The bathroom had a nice long counter that she could hold to to get in bathroom. Has not heard anything on the AFO yet.    Patient is accompained by: Family member   daughter   Pertinent History PMH: depression, headache, HLD    Patient Stated Goals Pt would like to be able to walk more and  get out of house some without always being in the chair.    Currently in Pain? No/denies                             [facility] Adult PT Treatment/Exercise - 04/27/20 1153      Transfers   Transfers Sit to Stand;Stand to [employer] Transfers    Sit to Stand 5: Supervision;4: Min guard    Sit to Stand Details Verbal cues for technique    Sit to Stand Details (indicate cue type and reason) Pt was cued to push from chair and lean forward.    Five time sit to stand comments  21.24 sec with hands from chair    Stand to Sit 5: Supervision;4: Min guard    Stand to Sit Details (indicate cue type and reason) Verbal cues for technique    Stand to Sit Details Pt was cued to lean foward and reach back. Also reminded to back all the way up to bench and lock rollator prior to sitting.    Stand Pivot Transfers 5: Supervision    Stand Pivot Transfer Details (indicate cue type and reason) with walker mat to/from chair      Ambulation/Gait   Ambulation/Gait Yes    Ambulation/Gait Assistance 5: Supervision;4: Min guard  Ambulation/Gait Assistance Details Pt was cued to try to increase left hip flexion. Pt still getting recurvatum as foot supinates due to flimsy sneaker not providing support.    Ambulation Distance (Feet) 230 Feet    Assistive device Rollator   left posterior Thuasne AFO   Gait Pattern Step-through pattern;Decreased hip/knee flexion - left;Poor foot clearance - left    Ambulation Surface Level;Indoor      Neuro Re-ed    Neuro Re-ed Details  Standing in front of mat: bean bag tosses x 14 with reaching to side and picking bags off chair then repeated to other side CGA. Sit to stand 5 x 2 with 2" step under RLE with verbal cues for form to facilitate left weight shift.                    PT Short Term Goals - 04/13/20 1840      PT SHORT TERM GOAL #1   Title Pt will be able to perform intial HEP for strengthening with assist of daughter.    Baseline Pt has  been performing initial HEP    Time 4    Period Weeks    Status Achieved    Target Date 03/26/20      PT SHORT TERM GOAL #2   Title Pt will be assessed for left AFO need and orthotist consult conducted.    Baseline orthostist consult conducted 03/17/20 with recommendation of left posterior Thusane AFO with heel lift and leather toe cap    Time 4    Period Weeks    Status Achieved    Target Date 03/26/20      PT SHORT TERM GOAL #3   Title Pt will perform all transfers mod I for improved safety and mobility.    Baseline supervision with transfers    Time 4    Period Weeks    Status On-going    Target Date 03/26/20      PT SHORT TERM GOAL #4   Title Pt will be able to perform bed mobility mod I consistently for improved mobility.    Baseline supervision/CGA    Time 4    Period Weeks    Status On-going    Target Date 03/26/20      PT SHORT TERM GOAL #5   Title Pt will ambulate 38' with walker and left AFO CGA for short household distances.    Baseline 230' supervision/CGA with left AFO and walker    Time 4    Period Weeks    Status Achieved    Target Date 03/26/20             PT Long Term Goals - 04/27/20 1208      PT LONG TERM GOAL #1   Title Pt will decrease 5 x sit to stand from 32 sec to <22 sec for improved balance and functional strength.    Baseline 02/25/20 32 sec from chair with hands. 04/27/20 21.24 sec from chair with hands    Time 8    Period Weeks    Status Achieved      PT LONG TERM GOAL #2   Title Pt will be able to ambulate 200' with walker and AFO supervision for improved household mobility and short community distances.    Baseline 04/27/20 230'    Time 8    Period Weeks    Status Achieved      PT LONG TERM GOAL #3   Title Pt will be  able to maintain standing >5 min with minimal UE support for improved standing balance and to assist with ADLs.    Time 8    Period Weeks    Status New    Target Date 05/05/20                 Plan -  04/27/20 1352    Clinical Impression Statement Pt still waiting on new AFO. Continued to use clinic left posterior Thuasne AFO. Pt able to better clear foot with AFO. Still getting recurvatum with trial AFO and needs more sturdy sneakers to better support. She met her 5 x sit to stand goal decreasing time to 21.24 sec showing improving balance and functional strength. Pt has met gait distance goal at supervision level with AFO.  Pt continues to progress towards goals. Will benefit from continued skilled PT to further progress.    Personal Factors and Comorbidities Comorbidity 3+    Comorbidities depression, headache, HLD    Examination-Activity Limitations Stand;Stairs;Bed Mobility;Transfers;Bathing;Locomotion Level    Examination-Participation Restrictions Community Activity;Cleaning    Stability/Clinical Decision Making Evolving/Moderate complexity    Rehab Potential Good    PT Frequency 2x / week   plus eval   PT Duration 8 weeks    PT Treatment/Interventions ADLs/Self Care Home Management;Gait training;Stair training;Functional mobility training;Therapeutic activities;Therapeutic exercise;Balance training;Neuromuscular re-education;DME Instruction;Manual techniques;Orthotic Fit/Training;Patient/family education;Vestibular;Passive range of motion    PT Next Visit Plan Check remaining LTG next week and recert end of week. Gait with L Thuasne brace brace. Awaiting her AFO from [employer] when ready. I am calling to check on status. They are also going to do a heel lift.  Work on Teacher, early years/pre for ambulation, locking/unlocking bracks before sit to stand transfers. Bathroom doorway is 22" as daughter measured it. Continue to work on how to safely get in with using sink that is on right.  Left hip strengthening, standing balance.    Consulted and Agree with Plan of Care Patient;Family member/caregiver    Family Member Consulted daughter           Patient will benefit from skilled therapeutic  intervention in order to improve the following deficits and impairments:  Abnormal gait,Decreased balance,Decreased activity tolerance,Decreased mobility,Decreased knowledge of use of DME,Decreased endurance,Decreased range of motion,Decreased strength,Impaired vision/preception  Visit Diagnosis: Other abnormalities of gait and mobility  Muscle weakness (generalized)     Problem List Patient Active Problem List   Diagnosis Date Noted  . Hot flashes due to menopause 12/01/2019  . Encounter for gynecological examination with Papanicolaou smear of cervix 04/12/2016  . Constipation 04/12/2016  . Vaginal atrophy 04/12/2016  . Bilateral lower extremity edema with recent DOE 11/02/2015  . Microcytosis 10/16/2014  . Vitamin D insufficiency 04/20/2014  . Abnormality of gait 09/13/2013  . Depression 09/19/2011  . Multiple sclerosis ([place]) 03/06/2011  . Healthcare maintenance 03/06/2011  . Hyperlipidemia 09/17/2010    Electa Sniff, PT, DPT, NCS 04/27/2020, 2:09 PM  [facility] [address] [place], [place], [address] Phone: [phone]   Fax:  [phone]  Name: PHYNIX HORTON MRN: [id number] Date of Birth: 15-Dec-1960

## 2020-04-28 ENCOUNTER — Telehealth: Payer: Self-pay

## 2020-04-28 NOTE — Telephone Encounter (Signed)
PT called and spoke to [employer] representative to check on status of pt's AFO. It is in and they have left several messages with no call back and pt missed her appointment. PT provided them with another number to try and will notify pt and daughter next session if they have not heard to call. Elmer Bales, PT, DPT, NCS

## 2020-05-01 ENCOUNTER — Ambulatory Visit: Payer: Medicare Other

## 2020-05-08 ENCOUNTER — Ambulatory Visit: Payer: Medicare Other

## 2020-05-11 ENCOUNTER — Ambulatory Visit: Payer: Medicare Other

## 2020-05-15 ENCOUNTER — Ambulatory Visit: Payer: Medicare Other

## 2020-05-19 ENCOUNTER — Other Ambulatory Visit: Payer: Self-pay

## 2020-05-19 ENCOUNTER — Ambulatory Visit: Payer: Medicare Other

## 2020-05-19 DIAGNOSIS — R2689 Other abnormalities of gait and mobility: Secondary | ICD-10-CM | POA: Diagnosis not present

## 2020-05-19 DIAGNOSIS — R2681 Unsteadiness on feet: Secondary | ICD-10-CM

## 2020-05-19 DIAGNOSIS — M6281 Muscle weakness (generalized): Secondary | ICD-10-CM | POA: Diagnosis not present

## 2020-05-19 NOTE — Therapy (Addendum)
[facility] [address] [place], [place], [address] Phone: [phone]   Fax:  [phone]  Physical Therapy Treatment/Recert  Patient Details  Name: Kristen Boyle MRN: [id number] Date of Birth: [birth year] Referring Provider (PT): Dorian Pod referred but Edison Simon is PCP. Neurologist is Dr. Jannifer Franklin   Encounter Date: 05/19/2020   PT End of Session - 05/19/20 1107    Visit Number 11    Number of Visits 19    Date for PT Re-Evaluation 33/82/50   90 day cert, 60 day poc   Authorization Type medicare/medicaid, 10th visit progress note    PT Start Time 1105    PT Stop Time 1146    PT Time Calculation (min) 41 min    Equipment Utilized During Treatment Gait belt    Activity Tolerance Patient tolerated treatment well    Behavior During Therapy WFL for tasks assessed/performed           Past Medical History:  Diagnosis Date  . Abnormality of gait 06/10/2012  . Depression   . Headache   . Hyperlipidemia   . MS (multiple sclerosis) ([place])     Past Surgical History:  Procedure Laterality Date  . CHOLECYSTECTOMY N/A 06/20/2015   Procedure: LAPAROSCOPIC CHOLECYSTECTOMY;  Surgeon: Ralene Ok, MD;  Location: [facility];  Service: General;  Laterality: N/A;  . DENTAL SURGERY    . TUBAL LIGATION Bilateral     There were no vitals filed for this visit.   Subjective Assessment - 05/19/20 1108    Subjective Pt walked in with her new AFO that she got 2 weeks ago. She has not really worn much yet. She has also been more tired and after her shower in morning she has been going back to sleep in chair most of day. Reports eating pretty well but not drinking as much as she should.    Patient is accompained by: Family member   daughter   Pertinent History PMH: depression, headache, HLD    Patient Stated Goals Pt would like to be able to walk more and get out of house some without always being in the chair.    Currently  in Pain? No/denies              [facility] PT Assessment - 05/19/20 1114      Assessment   Medical Diagnosis MS    Referring Provider (PT) Dorian Pod referred but Edison Simon is PCP. Neurologist is Dr. Jannifer Franklin    Onset Date/Surgical Date 12/01/19                         [facility] Adult PT Treatment/Exercise - 05/19/20 1114      Transfers   Transfers Sit to Stand;Stand to [employer] to Stand 5: Supervision    Sit to Stand Details Verbal cues for technique    Sit to Stand Details (indicate cue type and reason) with UE support and verbal cues to push on mat to rise.    Stand to Sit 5: Supervision    Stand to Sit Details (indicate cue type and reason) Verbal cues for technique    Stand to Sit Details Cues to lock rollator prior to sitting and reach back.      Ambulation/Gait   Ambulation/Gait Yes    Ambulation/Gait Assistance 5: Supervision;4: Min guard    Ambulation/Gait Assistance Details Verbal cues to try to pick up left leg from hip as much  as possible. New left AFO was donned with her new sneakers.    Ambulation Distance (Feet) 115 Feet    Assistive device Rollator   left AFO   Gait Pattern Decreased hip/knee flexion - left;Decreased step length - left    Ambulation Surface Level;Indoor    Gait velocity 31.27 sec=0.41m/s      Standardized Balance Assessment   Standardized Balance Assessment Berg Balance Test      Berg Balance Test   Sit to Stand Able to stand  independently using hands    Standing Unsupported Able to stand 2 minutes with supervision    Sitting with Back Unsupported but Feet Supported on Floor or Stool Able to sit safely and securely 2 minutes    Stand to Sit Sits safely with minimal use of hands    Transfers Able to transfer with verbal cueing and /or supervision    Standing Unsupported with Eyes Closed Able to stand 10 seconds with supervision    Standing Ubsupported with Feet Together Needs help to attain position but able to stand for 30  seconds with feet together    From Standing, Reach Forward with Outstretched Arm Can reach forward >12 cm safely (5")    From Standing Position, Pick up Object from Floor Able to pick up shoe, needs supervision    From Standing Position, Turn to Look Behind Over each Shoulder Needs supervision when turning    Turn 360 Degrees Needs assistance while turning    Standing Unsupported, Alternately Place Feet on Step/Stool Able to complete >2 steps/needs minimal assist    Standing Unsupported, One Foot in Front Needs help to step but can hold 15 seconds    Standing on One Leg Unable to try or needs assist to prevent fall    Total Score 29      Neuro Re-ed    Neuro Re-ed Details  Standing at walker >5 min with light UE support.                  PT Education - 05/20/20 1316    Education Details Discussed recert plan for 1x/week for 8 weeks to better fit her daughter's schedule to bring her.    Person(s) Educated Patient;Child(ren)    Methods Explanation    Comprehension Verbalized understanding            PT Short Term Goals - 04/13/20 1840      PT SHORT TERM GOAL #1   Title Pt will be able to perform intial HEP for strengthening with assist of daughter.    Baseline Pt has been performing initial HEP    Time 4    Period Weeks    Status Achieved    Target Date 03/26/20      PT SHORT TERM GOAL #2   Title Pt will be assessed for left AFO need and orthotist consult conducted.    Baseline orthostist consult conducted 03/17/20 with recommendation of left posterior [place] AFO with heel lift and leather toe cap    Time 4    Period Weeks    Status Achieved    Target Date 03/26/20      PT SHORT TERM GOAL #3   Title Pt will perform all transfers mod I for improved safety and mobility.    Baseline supervision with transfers    Time 4    Period Weeks    Status On-going    Target Date 03/26/20      PT SHORT TERM GOAL #4  Title Pt will be able to perform bed mobility mod I  consistently for improved mobility.    Baseline supervision/CGA    Time 4    Period Weeks    Status On-going    Target Date 03/26/20      PT SHORT TERM GOAL #5   Title Pt will ambulate 75' with walker and left AFO CGA for short household distances.    Baseline 230' supervision/CGA with left AFO and walker    Time 4    Period Weeks    Status Achieved    Target Date 03/26/20             PT Long Term Goals - 05/19/20 1122      PT LONG TERM GOAL #1   Title Pt will decrease 5 x sit to stand from 32 sec to <22 sec for improved balance and functional strength.    Baseline 02/25/20 32 sec from chair with hands. 04/27/20 21.24 sec from chair with hands    Time 8    Period Weeks    Status Achieved      PT LONG TERM GOAL #2   Title Pt will be able to ambulate 200' with walker and AFO supervision for improved household mobility and short community distances.    Baseline 04/27/20 230'    Time 8    Period Weeks    Status Achieved      PT LONG TERM GOAL #3   Title Pt will be able to maintain standing >5 min with minimal UE support for improved standing balance and to assist with ADLs.    Baseline 05/19/20 >5 min with light UE uspport on walker    Time 8    Period Weeks    Status Achieved           Updated PT goals:  PT Short Term Goals - 05/20/20 1326      PT SHORT TERM GOAL #1   Title Pt will be mod I with all transfers for improved safety and mobility.    Baseline supervision    Time 4    Period Weeks    Status On-going    Target Date 06/19/20      PT SHORT TERM GOAL #2   Title Pt will be able to perform bed mobility mod I consistently for improved independence and mobility.    Baseline superivison/CGA    Time 4    Period Weeks    Status On-going    Target Date 06/19/20      PT SHORT TERM GOAL #3   Title Pt will decrease 5 x sit to stand from 21.24 sec to <18 sec for improved balance and functional strength.    Baseline 04/27/20 21.24 sec from mat with hands.    Time 4     Period Weeks    Status New    Target Date 06/19/20           PT Long Term Goals - 05/20/20 1329      PT LONG TERM GOAL #1   Title Pt will increase Berg from 29 to >34/56 for improved balance and decreased fall risk. (LTGs due 07/19/20)    Baseline 05/19/20 29/56    Time 8    Period Weeks    Status New    Target Date 07/19/20      PT LONG TERM GOAL #2   Title Pt will ambulate >400' with walker and AFO supervision for improved household and short community  distances.    Time 8    Period Weeks    Status New    Target Date 07/19/20      PT LONG TERM GOAL #3   Title Pt will increase gait speed to >0.39m/s for improved household gait safety.    Baseline 05/19/20 0.41m/s    Time 8    Period Weeks    Status New    Target Date 07/19/20      PT LONG TERM GOAL #4   Title Pt will be independent with progressive HEP for strengthening and balance with family.    Time 8    Period Weeks    Status New    Target Date 07/19/20                Plan - 05/20/20 1318    Clinical Impression Statement Pt has shown good progress towards all goals. She met LTG for standing time today increasing to >5 min with light UE support at walker. She received her new left AFO and with it and her new shoes she had better left foot clearance and knee stability with less recurvatum. Pt still lacking left hip flexion which affects gait quality as well. Pt has shown increase in balance and functional strength with decreasing 5 x sit to stand time. Assessed Berg balance test today with score of 29/56 indicating high fall risk. Gait speed of 0.64m/s shows decreased safety with household ambulation. Pt also reports high fatigue levels. PT did discuss that if she continues to have more issues to this to let MD know as well to be sure it not due to anemia that she was experiencing last month versus result of MS. Pt will benefit from continued skilled PT to continue to address her strength, balance and functional  mobility deficits.    Personal Factors and Comorbidities Comorbidity 3+    Comorbidities depression, headache, HLD    Examination-Activity Limitations Stand;Stairs;Bed Mobility;Transfers;Bathing;Locomotion Level    Examination-Participation Restrictions Community Activity;Cleaning    Stability/Clinical Decision Making Evolving/Moderate complexity    Rehab Potential Good    PT Frequency 1x / week   plus eval   PT Duration 8 weeks    PT Treatment/Interventions ADLs/Self Care Home Management;Gait training;Stair training;Functional mobility training;Therapeutic activities;Therapeutic exercise;Balance training;Neuromuscular re-education;DME Instruction;Manual techniques;Orthotic Fit/Training;Patient/family education;Vestibular;Passive range of motion    PT Next Visit Plan Did they put heel lift under AFO to help with recurvatum? I forgot to check last session. Work on Teacher, early years/pre for ambulation, locking/unlocking bracks before sit to stand transfers.  Left hip strengthening, standing balance.    Consulted and Agree with Plan of Care Patient;Family member/caregiver    Family Member Consulted daughter           Patient will benefit from skilled therapeutic intervention in order to improve the following deficits and impairments:  Abnormal gait,Decreased balance,Decreased activity tolerance,Decreased mobility,Decreased knowledge of use of DME,Decreased endurance,Decreased range of motion,Decreased strength,Impaired vision/preception  Visit Diagnosis: Other abnormalities of gait and mobility  Muscle weakness (generalized)  Unsteadiness on feet     Problem List Patient Active Problem List   Diagnosis Date Noted  . Hot flashes due to menopause 12/01/2019  . Encounter for gynecological examination with Papanicolaou smear of cervix 04/12/2016  . Constipation 04/12/2016  . Vaginal atrophy 04/12/2016  . Bilateral lower extremity edema with recent DOE 11/02/2015  . Microcytosis 10/16/2014  .  Vitamin D insufficiency 04/20/2014  . Abnormality of gait 09/13/2013  . Depression 09/19/2011  . Multiple sclerosis ([place])  03/06/2011  . Healthcare maintenance 03/06/2011  . Hyperlipidemia 09/17/2010    Electa Sniff, PT, DPT, NCS 05/20/2020, 1:26 PM  [facility] [address] [place], [place], [address] Phone: [phone]   Fax:  [phone]  Name: Kristen Boyle MRN: [id number] Date of Birth: [birth year]

## 2020-05-20 NOTE — Addendum Note (Signed)
Addended by: Elmer Bales A on: 05/20/2020 01:35 PM   Modules accepted: Orders

## 2020-05-23 ENCOUNTER — Other Ambulatory Visit: Payer: Self-pay

## 2020-05-23 ENCOUNTER — Ambulatory Visit: Payer: Medicare Other | Attending: Internal Medicine

## 2020-05-23 DIAGNOSIS — R2681 Unsteadiness on feet: Secondary | ICD-10-CM | POA: Diagnosis not present

## 2020-05-23 DIAGNOSIS — M6281 Muscle weakness (generalized): Secondary | ICD-10-CM | POA: Diagnosis not present

## 2020-05-23 DIAGNOSIS — R2689 Other abnormalities of gait and mobility: Secondary | ICD-10-CM | POA: Diagnosis not present

## 2020-05-23 NOTE — Therapy (Signed)
[facility] [address] [place], [place], [address] Phone: [phone]   Fax:  [phone]  Physical Therapy Treatment  Patient Details  Name: Kristen Boyle MRN: [id number] Date of Birth: Apr 22, 1960 Referring Provider (PT): Charissa Bash referred but Tonye Royalty is PCP. Neurologist is Dr. Anne Hahn   Encounter Date: 05/23/2020   PT End of Session - 05/23/20 1452    Visit Number 12    Number of Visits 19    Date for PT Re-Evaluation 08/18/20   90 day cert, 60 day poc   Authorization Type medicare/medicaid, 10th visit progress note    PT Start Time 1450    PT Stop Time 1531    PT Time Calculation (min) 41 min    Equipment Utilized During Treatment Gait belt    Activity Tolerance Patient tolerated treatment well    Behavior During Therapy WFL for tasks assessed/performed           Past Medical History:  Diagnosis Date  . Abnormality of gait 06/10/2012  . Depression   . Headache   . Hyperlipidemia   . MS (multiple sclerosis) ([facility])     Past Surgical History:  Procedure Laterality Date  . CHOLECYSTECTOMY N/A 06/20/2015   Procedure: LAPAROSCOPIC CHOLECYSTECTOMY;  Surgeon: Axel Filler, MD;  Location: [facility];  Service: General;  Laterality: N/A;  . DENTAL SURGERY    . TUBAL LIGATION Bilateral     There were no vitals filed for this visit.   Subjective Assessment - 05/23/20 1452    Subjective Pt reports that she has had a little more energy not sleeping as much during the day. She has been walking around in home some to get to the kitchen and also when she has to go to the bathroom. Reports a little discomfort in medial/posterior ankle of AFO side.    Patient is accompained by: Family member   daughter   Pertinent History PMH: depression, headache, HLD    Patient Stated Goals Pt would like to be able to walk more and get out of house some without always being in the chair.    Currently in Pain? No/denies                              [facility] Adult PT Treatment/Exercise - 05/23/20 1454      Transfers   Transfers Sit to Stand;Stand to Sit    Sit to Stand 5: Supervision    Sit to Stand Details Verbal cues for technique    Sit to Stand Details (indicate cue type and reason) Verbal cues to push from surface she is sitting on    Stand to Sit 5: Supervision    Stand to Sit Details (indicate cue type and reason) Verbal cues for technique    Stand to Sit Details verbal cues to reach back prior to sitting      Ambulation/Gait   Ambulation/Gait Yes    Ambulation/Gait Assistance 5: Supervision    Ambulation/Gait Assistance Details PT added the heel lift under AFO that orthotist had given pt. Pt does want to try to supinate slightly but again improvement with decreasing recurvatum. Pt reported the discomfort in medial left ankle was better with heel lift added. Verbal cues to pick up leg from hip to help with clearance.    Ambulation Distance (Feet) 230 Feet    Assistive device Rollator   left AFO with adding in heel lift  Gait Pattern Step-through pattern;Decreased step length - left;Decreased hip/knee flexion - left    Ambulation Surface Level;Indoor      Neuro Re-ed    Neuro Re-ed Details  In // bars: standing without UE support x 30 sec, alternating arm raises x 10 each arm CGA. Standing with tossing 14 beam bags in to basket with verbal cues to engage core to stand up tall. Standing on airex without UE support x 30 sec then x 1 min with tactile cues at left quad to try to tighten up some CGA.                    PT Short Term Goals - 05/20/20 1326      PT SHORT TERM GOAL #1   Title Pt will be mod I with all transfers for improved safety and mobility.    Baseline supervision    Time 4    Period Weeks    Status On-going    Target Date 06/19/20      PT SHORT TERM GOAL #2   Title Pt will be able to perform bed mobility mod I consistently for improved independence  and mobility.    Baseline superivison/CGA    Time 4    Period Weeks    Status On-going    Target Date 06/19/20      PT SHORT TERM GOAL #3   Title Pt will decrease 5 x sit to stand from 21.24 sec to <18 sec for improved balance and functional strength.    Baseline 04/27/20 21.24 sec from mat with hands.    Time 4    Period Weeks    Status New    Target Date 06/19/20             PT Long Term Goals - 05/20/20 1329      PT LONG TERM GOAL #1   Title Pt will increase Berg from 29 to >34/56 for improved balance and decreased fall risk. (LTGs due 07/19/20)    Baseline 05/19/20 29/56    Time 8    Period Weeks    Status New    Target Date 07/19/20      PT LONG TERM GOAL #2   Title Pt will ambulate >400' with walker and AFO supervision for improved household and short community distances.    Time 8    Period Weeks    Status New    Target Date 07/19/20      PT LONG TERM GOAL #3   Title Pt will increase gait speed to >0.2m/s for improved household gait safety.    Baseline 05/19/20 0.69m/s    Time 8    Period Weeks    Status New    Target Date 07/19/20      PT LONG TERM GOAL #4   Title Pt will be independent with progressive HEP for strengthening and balance with family.    Time 8    Period Weeks    Status New    Target Date 07/19/20                 Plan - 05/23/20 1538    Clinical Impression Statement PT added the heel lift from orthotist under left AFO which further improved gait quality and pt reported less discomfort in medial ankle. Does get slight supination. She was challenged standing on compliant surface but did improve with practice needing only tactile cues at left quad to try to engage more.  Personal Factors and Comorbidities Comorbidity 3+    Comorbidities depression, headache, HLD    Examination-Activity Limitations Stand;Stairs;Bed Mobility;Transfers;Bathing;Locomotion Level    Examination-Participation Restrictions Community Activity;Cleaning     Stability/Clinical Decision Making Evolving/Moderate complexity    Rehab Potential Good    PT Frequency 1x / week   plus eval   PT Duration 8 weeks    PT Treatment/Interventions ADLs/Self Care Home Management;Gait training;Stair training;Functional mobility training;Therapeutic activities;Therapeutic exercise;Balance training;Neuromuscular re-education;DME Instruction;Manual techniques;Orthotic Fit/Training;Patient/family education;Vestibular;Passive range of motion    PT Next Visit Plan Work on using rollator for ambulation, locking/unlocking bracks before sit to stand transfers.  Left hip strengthening, standing balance. Balance on airex. Can we add any standing balance activities/weight shifting to HEP?    Consulted and Agree with Plan of Care Patient;Family member/caregiver    Family Member Consulted daughter           Patient will benefit from skilled therapeutic intervention in order to improve the following deficits and impairments:  Abnormal gait,Decreased balance,Decreased activity tolerance,Decreased mobility,Decreased knowledge of use of DME,Decreased endurance,Decreased range of motion,Decreased strength,Impaired vision/preception  Visit Diagnosis: Other abnormalities of gait and mobility  Muscle weakness (generalized)  Unsteadiness on feet     Problem List Patient Active Problem List   Diagnosis Date Noted  . Hot flashes due to menopause 12/01/2019  . Encounter for gynecological examination with Papanicolaou smear of cervix 04/12/2016  . Constipation 04/12/2016  . Vaginal atrophy 04/12/2016  . Bilateral lower extremity edema with recent DOE 11/02/2015  . Microcytosis 10/16/2014  . Vitamin D insufficiency 04/20/2014  . Abnormality of gait 09/13/2013  . Depression 09/19/2011  . Multiple sclerosis ([facility]) 03/06/2011  . Healthcare maintenance 03/06/2011  . Hyperlipidemia 09/17/2010    Ronn Melena, PT, DPT, NCS 05/23/2020, 3:41 PM  [facility] [address] [place], [place], [address] Phone: [phone]   Fax:  [phone]  Name: CAIRA POCHE MRN: [id number] Date of Birth: Apr 16, 1960

## 2020-05-28 ENCOUNTER — Encounter: Payer: Self-pay | Admitting: *Deleted

## 2020-05-28 NOTE — Progress Notes (Unsigned)

## 2020-05-29 ENCOUNTER — Encounter: Payer: Self-pay | Admitting: *Deleted

## 2020-05-29 NOTE — Progress Notes (Signed)
Things That May Be Affecting Your Health:  Alcohol  Hearing loss  Pain   x Depression  Home Safety  Sexual Health   Diabetes x Lack of physical activity  Stress  x Difficulty with daily activities  Loneliness  Tiredness   Drug use  Medicines  Tobacco use   Falls  Motor Vehicle Safety  Weight   Food choices  Oral Health  Other    YOUR PERSONALIZED HEALTH PLAN : 1. Schedule your next subsequent Medicare Wellness visit in one year 2. Attend all of your regular appointments to address your medical issues 3. Complete the preventative screenings and services   Annual Wellness Visit   Medicare Covered Preventative Screenings and Services  Services & Screenings Men and Women Who How Often Need? Date of Last Service Action  Abdominal Aortic Aneurysm Adults with AAA risk factors Once      Alcohol Misuse and Counseling All Adults Screening once a year if no alcohol misuse. Counseling up to 4 face to face sessions.     Bone Density Measurement  Adults at risk for osteoporosis Once every 2 yrs      Lipid Panel Z13.6 All adults without CV disease Once every 5 yrs   12/01/19    Colorectal Cancer   Stool sample or  Colonoscopy All adults 50 and older   Once every year  Every 10 years x       Depression All Adults Once a year  Today   Diabetes Screening Blood glucose, post glucose load, or GTT Z13.1  All adults at risk  Pre-diabetics  Once per year  Twice per year      Diabetes  Self-Management Training All adults Diabetics 10 hrs first year; 2 hours subsequent years. Requires Copay     Glaucoma  Diabetics  Family history of glaucoma  African Americans 50 yrs +  Hispanic Americans 65 yrs + Annually - requires coppay x     Hepatitis C Z72.89 or F19.20  High Risk for HCV  Born between 1945 and 1965  Annually  Once      HIV Z11.4 All adults based on risk  Annually btw ages 72 & 10 regardless of risk  Annually > 65 yrs if at increased risk      Lung Cancer  Screening Asymptomatic adults aged 23-77 with 30 pack yr history and current smoker OR quit within the last 15 yrs Annually Must have counseling and shared decision making documentation before first screen      Medical Nutrition Therapy Adults with   Diabetes  Renal disease  Kidney transplant within past 3 yrs 3 hours first year; 2 hours subsequent years     Obesity and Counseling All adults Screening once a year Counseling if BMI 30 or higher  Today   Tobacco Use Counseling Adults who use tobacco  Up to 8 visits in one year     Vaccines Z23  Hepatitis B  Influenza   Pneumonia  Adults   Once  Once every flu season  Two different vaccines separated by one year     Next Annual Wellness Visit People with Medicare Every year  Today     Services & Screenings Women Who How Often Need  Date of Last Service Action  Mammogram  Z12.31 Women over 40 One baseline ages 60-39. Annually ager 40 yrs+      Pap tests All women Annually if high risk. Every 2 yrs for normal risk women x  Screening for cervical cancer with   Pap (Z01.419 nl or Z01.411abnl) &  HPV Z11.51 Women aged 56 to 72 Once every 5 yrs     Screening pelvic and breast exams All women Annually if high risk. Every 2 yrs for normal risk women     Sexually Transmitted Diseases  Chlamydia  Gonorrhea  Syphilis All at risk adults Annually for non pregnant females at increased risk         Services & Screenings Men Who How Ofter Need  Date of Last Service Action  Prostate Cancer - DRE & PSA Men over 50 Annually.  DRE might require a copay.        Sexually Transmitted Diseases  Syphilis All at risk adults Annually for men at increased risk      Health Maintenance List Health Maintenance  Topic Date Due  . COLONOSCOPY (Pts 45-58yrs Insurance coverage will need to be confirmed)  Never done  . PAP SMEAR-Modifier  04/13/2019  . MAMMOGRAM  12/05/2020 (Originally 07/09/2017)  . INFLUENZA VACCINE  08/21/2020   . LIPID PANEL  11/30/2020  . TETANUS/TDAP  03/05/2021  . Hepatitis C Screening  Completed  . HIV Screening  Completed  . HPV VACCINES  Aged Out

## 2020-05-30 ENCOUNTER — Ambulatory Visit: Payer: Medicare Other

## 2020-05-30 ENCOUNTER — Other Ambulatory Visit: Payer: Self-pay

## 2020-05-30 DIAGNOSIS — M6281 Muscle weakness (generalized): Secondary | ICD-10-CM | POA: Diagnosis not present

## 2020-05-30 DIAGNOSIS — R2689 Other abnormalities of gait and mobility: Secondary | ICD-10-CM | POA: Diagnosis not present

## 2020-05-30 DIAGNOSIS — R2681 Unsteadiness on feet: Secondary | ICD-10-CM | POA: Diagnosis not present

## 2020-05-30 NOTE — Therapy (Signed)
[facility] [address] [place], [place], [address] Phone: [phone]   Fax:  [phone]  Physical Therapy Treatment  Patient Details  Name: Kristen Boyle MRN: [id number] Date of Birth: [birth year] Referring Provider (PT): Charissa Bash referred but Tonye Royalty is PCP. Neurologist is Dr. Anne Hahn   Encounter Date: 05/30/2020   PT End of Session - 05/30/20 1452    Visit Number 13    Number of Visits 19    Date for PT Re-Evaluation 08/18/20   90 day cert, 60 day poc   Authorization Type medicare/medicaid, 10th visit progress note    PT Start Time 1452   patient arriving late   PT Stop Time 1530    PT Time Calculation (min) 38 min    Equipment Utilized During Treatment Gait belt    Activity Tolerance Patient tolerated treatment well    Behavior During Therapy WFL for tasks assessed/performed           Past Medical History:  Diagnosis Date  . Abnormality of gait 06/10/2012  . Depression   . Headache   . Hyperlipidemia   . MS (multiple sclerosis) (HCC)     Past Surgical History:  Procedure Laterality Date  . CHOLECYSTECTOMY N/A 06/20/2015   Procedure: LAPAROSCOPIC CHOLECYSTECTOMY;  Surgeon: Axel Filler, MD;  Location: [facility];  Service: General;  Laterality: N/A;  . DENTAL SURGERY    . TUBAL LIGATION Bilateral     There were no vitals filed for this visit.   Subjective Assessment - 05/30/20 1454    Subjective Patient reports no new changes/complaints. Patient/daughter reports wearing brace approx 2-3x/week, in about 4 hour session. Wedge has improved the pain on medial ankle.    Patient is accompained by: Family member   daughter   Pertinent History PMH: depression, headache, HLD    Patient Stated Goals Pt would like to be able to walk more and get out of house some without always being in the chair.    Currently in Pain? No/denies             [facility] Adult PT Treatment/Exercise - 05/30/20 0001       Transfers   Transfers Sit to Stand;Stand to Sit    Sit to Stand 5: Supervision    Stand to Sit 5: Supervision    Comments verbal cues for brake management and locking brakes prior to descent onto mat, able to demo carryover at start of session but limited at end of session.      Ambulation/Gait   Ambulation/Gait Yes    Ambulation/Gait Assistance 5: Supervision    Ambulation/Gait Assistance Details Continued gait training with AFO and heel lift added. No pain today with ambulation. Tactile/verbal cues for improved hip flexion to promote clearance, as well as tactile cues for quad activitation to promote knee control in stance    Ambulation Distance (Feet) 230 Feet   x 1, 115 x 1   Assistive device Rollator    Gait Pattern Step-through pattern;Decreased step length - left;Decreased hip/knee flexion - left    Ambulation Surface Level;Indoor      Self-Care   Self-Care Other Self-Care Comments    Other Self-Care Comments  PT educating on gradually beginning to wear personal AFO daily for a few hours to promote improved tolerance for new orthotic, as patient is only wearing currently 2-3x/week. PT educating to begin wearing for few hours daily, and then we can begin to progress time each day. educated to monitor  skin as we increase wear time. Patient and daughter verbalized understanding.      Neuro Re-ed    Neuro Re-ed Details  At countertop completed standing balance activites including: standing with feet shoulder width completing weight shifts to R/L, PT providing facilation at pelvis for improved completion as patient often wants to lean trunk. increased challenge noted with this today. Completed standing balance with wide BOS and eyes open, completed horizontal/vertical head turns x 10 reps each direction then progressed to narrow BOS. PT providing intermittent cues for standing tall. Completed standing feet apart with eyes open, 3 x 45 seconds without UE support working on improved static  standing balance. Then with light UE support completed standing eyes closed 3 x 20 -30 seconds. Increased challenge overall with narrow BOS and vision removed. Intermittent rest breaks required.           Bolded Exercises completed and added to HEP during today's session:    Access Code: [email or web address] URL: [email or web address] Date: 05/30/2020 Prepared by: Jethro Bastos  Exercises Supine Lower Trunk Rotation - 1 x daily - 5 x weekly - 1 sets - 10 reps - 10 seconds hold Hooklying Gluteal Sets - 1 x daily - 5 x weekly - 1 sets - 10 reps - 3 hold Supine Bridge - 1 x daily - 5 x weekly - 1 sets - 10 reps Clamshell - 1 x daily - 5 x weekly - 1 sets - 10 reps Supine Heel Slide - 1 x daily - 5 x weekly - 1 sets - 10 reps Sit to Stand with Armchair - 1 x daily - 5 x weekly - 2 sets - 10 reps Seated Heel Slide - 1 x daily - 5 x weekly - 2 sets - 5 reps Wide Stance with Head Nods and Counter Support - 1 x daily - 7 x weekly - 1 sets - 10 reps Wide Stance with Counter Support - 1 x daily - 7 x weekly - 1 sets - 3 reps - 45 secs to 60 secs hold       PT Education - 05/30/20 1531    Education Details Updated HEP (Standing [employer])    Person(s) Educated Patient;Child(ren)    Methods Explanation;Demonstration;Handout    Comprehension Verbalized understanding;Returned demonstration            PT Short Term Goals - 05/20/20 1326      PT SHORT TERM GOAL #1   Title Pt will be mod I with all transfers for improved safety and mobility.    Baseline supervision    Time 4    Period Weeks    Status On-going    Target Date 06/19/20      PT SHORT TERM GOAL #2   Title Pt will be able to perform bed mobility mod I consistently for improved independence and mobility.    Baseline superivison/CGA    Time 4    Period Weeks    Status On-going    Target Date 06/19/20      PT SHORT TERM GOAL #3   Title Pt will decrease 5 x sit to stand from 21.24 sec to <18 sec for improved  balance and functional strength.    Baseline 04/27/20 21.24 sec from mat with hands.    Time 4    Period Weeks    Status New    Target Date 06/19/20             PT Long Term Goals - 05/20/20  1329      PT LONG TERM GOAL #1   Title Pt will increase Berg from 29 to >34/56 for improved balance and decreased fall risk. (LTGs due 07/19/20)    Baseline 05/19/20 29/56    Time 8    Period Weeks    Status New    Target Date 07/19/20      PT LONG TERM GOAL #2   Title Pt will ambulate >400' with walker and AFO supervision for improved household and short community distances.    Time 8    Period Weeks    Status New    Target Date 07/19/20      PT LONG TERM GOAL #3   Title Pt will increase gait speed to >0.28m/s for improved household gait safety.    Baseline 05/19/20 0.41m/s    Time 8    Period Weeks    Status New    Target Date 07/19/20      PT LONG TERM GOAL #4   Title Pt will be independent with progressive HEP for strengthening and balance with family.    Time 8    Period Weeks    Status New    Target Date 07/19/20                 Plan - 05/30/20 1536    Clinical Impression Statement Continued gait training with patient's AFO and rollator today working on improved clearance and brake management. Updated HEP to included standing balance at countertop with patient tolerating well. Increased challenge noted with weight shift today, withheld from HEP at this time. Will continue to progress toward all LTGs.    Personal Factors and Comorbidities Comorbidity 3+    Comorbidities depression, headache, HLD    Examination-Activity Limitations Stand;Stairs;Bed Mobility;Transfers;Bathing;Locomotion Level    Examination-Participation Restrictions Community Activity;Cleaning    Stability/Clinical Decision Making Evolving/Moderate complexity    Rehab Potential Good    PT Frequency 1x / week   plus eval   PT Duration 8 weeks    PT Treatment/Interventions ADLs/Self Care Home  Management;Gait training;Stair training;Functional mobility training;Therapeutic activities;Therapeutic exercise;Balance training;Neuromuscular re-education;DME Instruction;Manual techniques;Orthotic Fit/Training;Patient/family education;Vestibular;Passive range of motion    PT Next Visit Plan How was new exercise additions? Work on Software engineer for ambulation, locking/unlocking bracks before sit to stand transfers.  Left hip strengthening, standing balance. Balance on airex. Continue weight shift working on form, add to HEP when form/technique improves.    Consulted and Agree with Plan of Care Patient;Family member/caregiver    Family Member Consulted daughter           Patient will benefit from skilled therapeutic intervention in order to improve the following deficits and impairments:  Abnormal gait,Decreased balance,Decreased activity tolerance,Decreased mobility,Decreased knowledge of use of DME,Decreased endurance,Decreased range of motion,Decreased strength,Impaired vision/preception  Visit Diagnosis: Other abnormalities of gait and mobility  Muscle weakness (generalized)  Unsteadiness on feet     Problem List Patient Active Problem List   Diagnosis Date Noted  . Hot flashes due to menopause 12/01/2019  . Encounter for gynecological examination with Papanicolaou smear of cervix 04/12/2016  . Constipation 04/12/2016  . Vaginal atrophy 04/12/2016  . Bilateral lower extremity edema with recent DOE 11/02/2015  . Microcytosis 10/16/2014  . Vitamin D insufficiency 04/20/2014  . Abnormality of gait 09/13/2013  . Depression 09/19/2011  . Multiple sclerosis (HCC) 03/06/2011  . Healthcare maintenance 03/06/2011  . Hyperlipidemia 09/17/2010    Tempie Donning, PT, DPT 05/30/2020, 3:40 PM   Outpt  Rehabilitation [facility] [address] [place], [place], [address] Phone: [phone]   Fax:  [phone]  Name: Kristen Boyle MRN:  [id number] Date of Birth: 1960-07-14

## 2020-05-30 NOTE — Patient Instructions (Signed)
Access Code: [email or web address] URL: [email or web address] Date: 05/30/2020 Prepared by: Jethro Bastos  Exercises Supine Lower Trunk Rotation - 1 x daily - 5 x weekly - 1 sets - 10 reps - 10 seconds hold Hooklying Gluteal Sets - 1 x daily - 5 x weekly - 1 sets - 10 reps - 3 hold Supine Bridge - 1 x daily - 5 x weekly - 1 sets - 10 reps Clamshell - 1 x daily - 5 x weekly - 1 sets - 10 reps Supine Heel Slide - 1 x daily - 5 x weekly - 1 sets - 10 reps Sit to Stand with Armchair - 1 x daily - 5 x weekly - 2 sets - 10 reps Seated Heel Slide - 1 x daily - 5 x weekly - 2 sets - 5 reps Wide Stance with Head Nods and Counter Support - 1 x daily - 7 x weekly - 1 sets - 10 reps Wide Stance with Counter Support - 1 x daily - 7 x weekly - 1 sets - 3 reps - 45 secs to 60 secs hold

## 2020-06-13 ENCOUNTER — Other Ambulatory Visit: Payer: Self-pay

## 2020-06-13 ENCOUNTER — Ambulatory Visit: Payer: Medicare Other

## 2020-06-13 DIAGNOSIS — M6281 Muscle weakness (generalized): Secondary | ICD-10-CM | POA: Diagnosis not present

## 2020-06-13 DIAGNOSIS — R2681 Unsteadiness on feet: Secondary | ICD-10-CM | POA: Diagnosis not present

## 2020-06-13 DIAGNOSIS — R2689 Other abnormalities of gait and mobility: Secondary | ICD-10-CM

## 2020-06-13 NOTE — Therapy (Signed)
[facility] [address] [place], [place], [address] Phone: [phone]   Fax:  [phone]  Physical Therapy Treatment  Patient Details  Name: Kristen Boyle MRN: [id number] Date of Birth: [birth year] Referring Provider (PT): Dorian Pod referred but Edison Simon is PCP. Neurologist is Dr. Jannifer Franklin   Encounter Date: 06/13/2020   PT End of Session - 06/13/20 1450    Visit Number 14    Number of Visits 19    Date for PT Re-Evaluation 54/65/68   90 day cert, 60 day poc   Authorization Type medicare/medicaid, 10th visit progress note    PT Start Time 1448    PT Stop Time 1531    PT Time Calculation (min) 43 min    Equipment Utilized During Treatment Gait belt    Activity Tolerance Patient tolerated treatment well    Behavior During Therapy WFL for tasks assessed/performed           Past Medical History:  Diagnosis Date  . Abnormality of gait 06/10/2012  . Depression   . Headache   . Hyperlipidemia   . MS (multiple sclerosis) ([place])     Past Surgical History:  Procedure Laterality Date  . CHOLECYSTECTOMY N/A 06/20/2015   Procedure: LAPAROSCOPIC CHOLECYSTECTOMY;  Surgeon: Ralene Ok, MD;  Location: [facility];  Service: General;  Laterality: N/A;  . DENTAL SURGERY    . TUBAL LIGATION Bilateral     There were no vitals filed for this visit.   Subjective Assessment - 06/13/20 1451    Subjective Patient reports that she has not been doing as much. Does find that she does less when daughter out of town. She does get some discomfort in left heel area when weight bearing in brace but has not really been wearing.    Patient is accompained by: Family member   daughter   Pertinent History PMH: depression, headache, HLD    Patient Stated Goals Pt would like to be able to walk more and get out of house some without always being in the chair.    Currently in Pain? No/denies                              [facility] Adult PT Treatment/Exercise - 06/13/20 1452      Bed Mobility   Bed Mobility Sit to Supine;Supine to Sit    Supine to Sit Independent   using arms to help left leg   Sit to Supine Independent   using arms to help left leg     Transfers   Transfers Sit to Stand;Stand to Sit;Stand Pivot Transfers    Sit to Stand 5: Supervision    Sit to Stand Details Verbal cues for technique    Sit to Stand Details (indicate cue type and reason) Verbal cues for hand placement    Five time sit to stand comments  20 sec from mat with hands in lap    Stand to Sit 5: Supervision    Stand Pivot Transfers 5: Supervision;4: Min guard      Ambulation/Gait   Ambulation/Gait Yes    Ambulation/Gait Assistance 5: Supervision    Ambulation/Gait Assistance Details PT tightened laces and added top notch after first bout as pt was reporting feeling that left heel was slipping out some. Felt better after. Pt was cued to try to pick up left leg more from hip.    Ambulation Distance (Feet) 115 Feet  x 2   Assistive device Rollator   left AFO   Gait Pattern Step-through pattern;Decreased step length - left    Ambulation Surface Level;Indoor      Neuro Re-ed    Neuro Re-ed Details  In // bars: standing feet apart without UE support x 30 sec, standing feet apart with reaching in various directions x 6 each side then reaching across body x 6 each side. CGA for safety. Standing in staggered stance x 30 sec each position with PT providing tactile cues at left knee when was posterior to try to keep straighter. Standing with LLE on 2" step x 10 sec with CGA and verbal cues to straighten right knee. Pt needed some assistance to get left foot al the way on step. Then tapping 2" step with RLE x 5 with PT stabilizing at left knee.                  PT Education - 06/13/20 1908    Education Details Pt was instructed to try to wear AFO daily for a couple hours to be able to get used  to it more.    Person(s) Educated Patient    Methods Explanation    Comprehension Verbalized understanding            PT Short Term Goals - 06/13/20 1502      PT SHORT TERM GOAL #1   Title Pt will be mod I with all transfers for improved safety and mobility.    Baseline supervision    Time 4    Period Weeks    Status On-going    Target Date 06/19/20      PT SHORT TERM GOAL #2   Title Pt will be able to perform bed mobility mod I consistently for improved independence and mobility.    Baseline superivison/CGA. 06/13/20 mod I    Time 4    Period Weeks    Status Achieved    Target Date 06/19/20      PT SHORT TERM GOAL #3   Title Pt will decrease 5 x sit to stand from 21.24 sec to <18 sec for improved balance and functional strength.    Baseline 04/27/20 21.24 sec from mat with hands. 06/13/20 20 sec with hands in lap    Time 4    Period Weeks    Status Partially Met    Target Date 06/19/20             PT Long Term Goals - 05/20/20 1329      PT LONG TERM GOAL #1   Title Pt will increase Berg from 29 to >34/56 for improved balance and decreased fall risk. (LTGs due 07/19/20)    Baseline 05/19/20 29/56    Time 8    Period Weeks    Status New    Target Date 07/19/20      PT LONG TERM GOAL #2   Title Pt will ambulate >400' with walker and AFO supervision for improved household and short community distances.    Time 8    Period Weeks    Status New    Target Date 07/19/20      PT LONG TERM GOAL #3   Title Pt will increase gait speed to >0.16m/s for improved household gait safety.    Baseline 05/19/20 0.62m/s    Time 8    Period Weeks    Status New    Target Date 07/19/20      PT LONG  TERM GOAL #4   Title Pt will be independent with progressive HEP for strengthening and balance with family.    Time 8    Period Weeks    Status New    Target Date 07/19/20                 Plan - 06/13/20 1909    Clinical Impression Statement PT assessed STGs today with pt  continuing to show progress. She met bed mobility goal only having to assist to lift left leg some with arms. She is still supervision for safety with transfers. She decreased her 5 x sit to stand time just not to goal. She was also able to perform with hands in lap today. PT continued to work more on standing balance activities decreasing UE support.    Personal Factors and Comorbidities Comorbidity 3+    Comorbidities depression, headache, HLD    Examination-Activity Limitations Stand;Stairs;Bed Mobility;Transfers;Bathing;Locomotion Level    Examination-Participation Restrictions Community Activity;Cleaning    Stability/Clinical Decision Making Evolving/Moderate complexity    Rehab Potential Good    PT Frequency 1x / week   plus eval   PT Duration 8 weeks    PT Treatment/Interventions ADLs/Self Care Home Management;Gait training;Stair training;Functional mobility training;Therapeutic activities;Therapeutic exercise;Balance training;Neuromuscular re-education;DME Instruction;Manual techniques;Orthotic Fit/Training;Patient/family education;Vestibular;Passive range of motion    PT Next Visit Plan Work on using rollator for ambulation, locking/unlocking bracks before sit to stand transfers.  Left hip strengthening, standing balance. Balance on airex. Continue weight shift working on form, add to HEP when form improves.    Consulted and Agree with Plan of Care Patient;Family member/caregiver    Family Member Consulted daughter           Patient will benefit from skilled therapeutic intervention in order to improve the following deficits and impairments:  Abnormal gait,Decreased balance,Decreased activity tolerance,Decreased mobility,Decreased knowledge of use of DME,Decreased endurance,Decreased range of motion,Decreased strength,Impaired vision/preception  Visit Diagnosis: Other abnormalities of gait and mobility  Muscle weakness (generalized)  Unsteadiness on feet     Problem  List Patient Active Problem List   Diagnosis Date Noted  . Hot flashes due to menopause 12/01/2019  . Encounter for gynecological examination with Papanicolaou smear of cervix 04/12/2016  . Constipation 04/12/2016  . Vaginal atrophy 04/12/2016  . Bilateral lower extremity edema with recent DOE 11/02/2015  . Microcytosis 10/16/2014  . Vitamin D insufficiency 04/20/2014  . Abnormality of gait 09/13/2013  . Depression 09/19/2011  . Multiple sclerosis ([place]) 03/06/2011  . Healthcare maintenance 03/06/2011  . Hyperlipidemia 09/17/2010    Electa Sniff, PT, DPT, NCS 06/13/2020, 7:15 PM  [facility] [address] [place], [place], [address] Phone: [phone]   Fax:  [phone]  Name: MICA RAMDASS MRN: [id number] Date of Birth: [birth year]

## 2020-06-21 ENCOUNTER — Ambulatory Visit: Payer: Medicare Other

## 2020-06-27 ENCOUNTER — Ambulatory Visit: Payer: Medicare Other

## 2020-06-28 DIAGNOSIS — R9431 Abnormal electrocardiogram [ECG] [EKG]: Secondary | ICD-10-CM | POA: Diagnosis not present

## 2020-06-28 DIAGNOSIS — M79602 Pain in left arm: Secondary | ICD-10-CM | POA: Diagnosis not present

## 2020-06-28 DIAGNOSIS — M546 Pain in thoracic spine: Secondary | ICD-10-CM | POA: Diagnosis not present

## 2020-06-28 DIAGNOSIS — K5900 Constipation, unspecified: Secondary | ICD-10-CM | POA: Diagnosis not present

## 2020-06-28 DIAGNOSIS — R1013 Epigastric pain: Secondary | ICD-10-CM | POA: Diagnosis not present

## 2020-06-28 DIAGNOSIS — R001 Bradycardia, unspecified: Secondary | ICD-10-CM | POA: Diagnosis not present

## 2020-06-29 DIAGNOSIS — R001 Bradycardia, unspecified: Secondary | ICD-10-CM | POA: Diagnosis not present

## 2020-06-29 DIAGNOSIS — R9431 Abnormal electrocardiogram [ECG] [EKG]: Secondary | ICD-10-CM | POA: Diagnosis not present

## 2020-07-03 ENCOUNTER — Ambulatory Visit: Payer: Medicare Other | Admitting: Neurology

## 2020-07-03 ENCOUNTER — Encounter: Payer: Self-pay | Admitting: Neurology

## 2020-07-03 NOTE — Progress Notes (Deleted)
PATIENT: Kristen Boyle DOB: Apr 17, 1960  REASON FOR VISIT: follow up HISTORY FROM: patient Primary Neurologist:  HISTORY OF PRESENT ILLNESS: Today 07/03/20 Kristen Boyle is a 60 year old female with history of MS.  Has left hemiparesis and gait disorder.  At last visit in January, initiated Ocrevus infusion, her first infusion was in February 2022. In March, found to be anemic, 8.7, Dr. Anne Hahn started iron, rechecked about 2 weeks later up to 9.6.  MRI of the brain and cervical spine have yet to be completed.  HISTORY  01/25/2020 Dr. Anne Hahn: Kristen Boyle is a 60 year old right-handed black female with history of multiple sclerosis associated with bilateral optic neuritis and a left hemiparesis with a gait disorder. The patient has been lost to follow-up, she was last seen here about 2-1/2 years ago. She was on Betaseron at that time. She has an AFO brace for the left foot but she has not been using it as it is uncomfortable. The patient over time has had a gradual slow change in her functional ability with increased problems with left-sided weakness and a decline in her ability to ambulate. The patient uses a walker with a seat to get around, she did have a fall several days ago when she got up out of bed and got dizzy and may have blacked out. Generally she does not fall while using the walker. She reports no difficulty controlling the bowels or the bladder. She does have significant visual impairment that she believes has slowly changed over time. She denies any double vision. She reports that both legs have gotten weaker and the left arm has gotten weaker. She has also noted some cognitive changes. Her primary care physician has set her up for some physical therapy which has not yet started. She comes to this office for further evaluation. She has not been on any disease modifying agents. In the past, she has been positive for the JC viral antibody.  REVIEW OF SYSTEMS: Out of a complete 14 system  review of symptoms, the patient complains only of the following symptoms, and all other reviewed systems are negative.  ALLERGIES: Allergies  Allergen Reactions   Gilenya [Fingolimod]     Stomach upset    HOME MEDICATIONS: Outpatient Medications Prior to Visit  Medication Sig Dispense Refill   Cholecalciferol 25 MCG (1000 UT) tablet Take 1 tablet (1,000 Units total) by mouth daily. 90 tablet 1   co-enzyme Q-10 50 MG capsule Take 50 mg by mouth daily. (Patient not taking: Reported on 02/25/2020)     ferrous gluconate (FERGON) 324 MG tablet Take 1 tablet (324 mg total) by mouth daily with breakfast. 30 tablet 3   ibuprofen (ADVIL,MOTRIN) 200 MG tablet Take 400 mg by mouth every 6 (six) hours as needed (For pain.).     No facility-administered medications prior to visit.    PAST MEDICAL HISTORY: Past Medical History:  Diagnosis Date   Abnormality of gait 06/10/2012   Depression    Headache    Hyperlipidemia    MS (multiple sclerosis) (HCC)     PAST SURGICAL HISTORY: Past Surgical History:  Procedure Laterality Date   CHOLECYSTECTOMY N/A 06/20/2015   Procedure: LAPAROSCOPIC CHOLECYSTECTOMY;  Surgeon: Axel Filler, MD;  Location: [facility];  Service: General;  Laterality: N/A;   DENTAL SURGERY     TUBAL LIGATION Bilateral     FAMILY HISTORY: Family History  Problem Relation Age of Onset   Alcoholism Father    Diabetes Paternal Aunt  Dementia Paternal Grandmother        Senile dementia of Alzheimer's type   Colon cancer Cousin    Colon cancer Maternal Aunt     SOCIAL HISTORY: Social History   Socioeconomic History   Marital status: Single    Spouse name: Not on file   Number of children: 4   Years of education: 2-College   Highest education level: Not on file  Occupational History    Comment: Disabled  Tobacco Use   Smoking status: Never   Smokeless tobacco: Never  Substance and Sexual Activity   Alcohol use: No    Alcohol/week: 0.0 standard drinks   Drug  use: No   Sexual activity: Not Currently  Other Topics Concern   Not on file  Social History Narrative   Patient lives at home with her daughter.   Disabled.   Education two years of college.   Right handed.   Caffeine 1-2 cups caffeine daily   Social Determinants of Health   Financial Resource Strain: Not on file  Food Insecurity: Not on file  Transportation Needs: Not on file  Physical Activity: Not on file  Stress: Not on file  Social Connections: Not on file  Intimate Partner Violence: Not on file      PHYSICAL EXAM  There were no vitals filed for this visit. There is no height or weight on file to calculate BMI.  Generalized: Well developed, in no acute distress   Neurological examination  Mentation: Alert oriented to time, place, history taking. Follows all commands speech and language fluent Cranial nerve II-XII: Pupils were equal round reactive to light. Extraocular movements were full, visual field were full on confrontational test. Facial sensation and strength were normal. Uvula tongue midline. Head turning and shoulder shrug  were normal and symmetric. Motor: The motor testing reveals 5 over 5 strength of all 4 extremities. Good symmetric motor tone is noted throughout.  Sensory: Sensory testing is intact to soft touch on all 4 extremities. No evidence of extinction is noted.  Coordination: Cerebellar testing reveals good finger-nose-finger and heel-to-shin bilaterally.  Gait and station: Gait is normal. Tandem gait is normal. Romberg is negative. No drift is seen.  Reflexes: Deep tendon reflexes are symmetric and normal bilaterally.   DIAGNOSTIC DATA (LABS, IMAGING, TESTING) - I reviewed patient records, labs, notes, testing and imaging myself where available.  Lab Results  Component Value Date   WBC 3.1 (L) 04/06/2020   HGB 9.6 (L) 04/06/2020   HCT 32.8 (L) 04/06/2020   MCV 75 (L) 04/06/2020   PLT 317 04/06/2020      Component Value Date/Time   NA  143 03/23/2020 1432   K 3.8 03/23/2020 1432   CL 107 (H) 03/23/2020 1432   CO2 21 03/23/2020 1432   GLUCOSE 104 (H) 03/23/2020 1432   GLUCOSE 93 04/16/2015 2254   BUN 10 03/23/2020 1432   CREATININE 0.87 03/23/2020 1432   CREATININE 0.89 09/09/2013 1115   CALCIUM 9.6 03/23/2020 1432   PROT 7.4 03/23/2020 1432   ALBUMIN 4.4 03/23/2020 1432   AST 18 03/23/2020 1432   ALT 19 03/23/2020 1432   ALKPHOS 92 03/23/2020 1432   BILITOT 0.2 03/23/2020 1432   GFRNONAA 84 12/01/2019 1625   GFRNONAA 73 09/09/2013 1115   GFRAA 96 12/01/2019 1625   GFRAA 84 09/09/2013 1115   Lab Results  Component Value Date   CHOL 248 (H) 12/01/2019   HDL 80 12/01/2019   LDLCALC 153 (H) 12/01/2019  TRIG 89 12/01/2019   CHOLHDL 3.1 12/01/2019   Lab Results  Component Value Date   HGBA1C 5.4 09/17/2010   No results found for: VITAMINB12 Lab Results  Component Value Date   TSH 2.410 11/01/2015      ASSESSMENT AND PLAN 60 y.o. year old female  has a past medical history of Abnormality of gait (06/10/2012), Depression, Headache, Hyperlipidemia, and MS (multiple sclerosis) (HCC). here with ***   I spent 15 minutes with the patient. 50% of this time was spent   Margie Ege, [place], DNP 07/03/2020, 5:33 AM [facility] [address], [address] [place], [place] [address] [phone]

## 2020-07-04 ENCOUNTER — Ambulatory Visit: Payer: Medicare Other

## 2020-07-18 ENCOUNTER — Encounter: Payer: Medicare Other | Admitting: Internal Medicine

## 2020-07-19 ENCOUNTER — Ambulatory Visit (INDEPENDENT_AMBULATORY_CARE_PROVIDER_SITE_OTHER): Payer: Medicare Other | Admitting: Internal Medicine

## 2020-07-19 ENCOUNTER — Telehealth: Payer: Self-pay

## 2020-07-19 DIAGNOSIS — G8929 Other chronic pain: Secondary | ICD-10-CM | POA: Diagnosis not present

## 2020-07-19 DIAGNOSIS — M79601 Pain in right arm: Secondary | ICD-10-CM | POA: Diagnosis not present

## 2020-07-19 DIAGNOSIS — R519 Headache, unspecified: Secondary | ICD-10-CM | POA: Diagnosis not present

## 2020-07-19 DIAGNOSIS — M25531 Pain in right wrist: Secondary | ICD-10-CM | POA: Insufficient documentation

## 2020-07-19 NOTE — Assessment & Plan Note (Addendum)
Patient complains of headaches off and on for years. She states it is constant and in the right temple. Denies n/v or photophobia. Takes ibuprofen and this tends to help. Drinking more water has helped. No changes in her vision.  Seems like there is no increased frequency or severity of the headache and this has been an ongoing issue.  No focal neurologic signs symptoms.  Discussed with the patient that she should follow-up with her neurologist due to her history of MS and ongoing headaches.  Patient states that she was scheduled but missed an appointment and has not rescheduled.  Encouraged her to reschedule an appointment as soon as possible.

## 2020-07-19 NOTE — Progress Notes (Signed)
  [facility] Internal Medicine Residency Telephone Encounter Continuity Care Appointment  HPI:  This telephone encounter was created for Kristen Boyle on 07/19/2020 for the following purpose/cc headaches and pain right wrist.   Past Medical History:  Past Medical History:  Diagnosis Date   Abnormality of gait 06/10/2012   Depression    Headache    Hyperlipidemia    MS (multiple sclerosis) (HCC)      ROS:  Review of Systems  Constitutional:  Negative for chills and fever.  Eyes:  Negative for blurred vision, double vision and photophobia.  Gastrointestinal:  Negative for abdominal pain, nausea and vomiting.  Musculoskeletal:  Positive for joint pain and myalgias.  Neurological:  Positive for headaches. Negative for dizziness and weakness.     Assessment / Plan / Recommendations:  Please see A&P under problem oriented charting for assessment of the patient's acute and chronic medical conditions.  As always, pt is advised that if symptoms worsen or new symptoms arise, they should go to an urgent care facility or to to ER for further evaluation.   Consent and Medical Decision Making:  Patient discussed with Dr. Antony Contras This is a telephone encounter between [name] and Soundra Lampley N Hosteen Kienast on 07/19/2020 for headaches and right wrist pain. The visit was conducted with the patient located at home and Lorra Freeman N Curtina Grills at [facility]. The patient's identity was confirmed using their DOB and current address. The patient has consented to being evaluated through a telephone encounter and understands the associated risks (an examination cannot be done and the patient may need to come in for an appointment) / benefits (allows the patient to remain at home, decreasing exposure to coronavirus). I personally spent 9 minutes on medical discussion.

## 2020-07-19 NOTE — Telephone Encounter (Signed)
error 

## 2020-07-19 NOTE — Assessment & Plan Note (Addendum)
Patient complains of right wrist pain for the past month. She states the pain is intermittent and achy in nature. Denies injury or trauma. No swelling or erythema. She has tried taking tylenol/ibuprofen for the pain and this has helped alleviate the pain. She states she plays games on her phone but not all day. She is right handed. She says the range of motion is normal. No weakness or decreased sensation.  No fevers chills nausea or vomiting.  No injuries to her shoulder or spine.  She is never had this pain before.    Assessment/plan: Unclear etiology of right wrist pain at this point.  Recommended patient continue using Tylenol and ibuprofen as that helps alleviate the pain.  If pain persists or worsens recommended patient to be evaluated in person.

## 2020-07-19 NOTE — Telephone Encounter (Deleted)
Dr Evie Lacks called requested a hopsital f/u for pt next week being discharged today was given 7/6 @2 :15 with her pcp

## 2020-07-20 NOTE — Progress Notes (Signed)
Internal Medicine Clinic Attending ° °Case discussed with Dr. Rehman  At the time of the visit.  We reviewed the resident’s history and exam and pertinent patient test results.  I agree with the assessment, diagnosis, and plan of care documented in the resident’s note.  ° °

## 2020-07-25 ENCOUNTER — Encounter: Payer: Self-pay | Admitting: *Deleted

## 2020-08-22 ENCOUNTER — Ambulatory Visit (INDEPENDENT_AMBULATORY_CARE_PROVIDER_SITE_OTHER): Payer: Medicare Other | Admitting: Adult Health

## 2020-08-22 ENCOUNTER — Telehealth: Payer: Self-pay | Admitting: Adult Health

## 2020-08-22 ENCOUNTER — Encounter: Payer: Self-pay | Admitting: Adult Health

## 2020-08-22 VITALS — BP 130/85 | HR 71 | Ht 63.0 in | Wt 146.0 lb

## 2020-08-22 DIAGNOSIS — D509 Iron deficiency anemia, unspecified: Secondary | ICD-10-CM

## 2020-08-22 DIAGNOSIS — G35 Multiple sclerosis: Secondary | ICD-10-CM

## 2020-08-22 NOTE — Progress Notes (Signed)
PATIENT: Kristen Boyle DOB: 06-Sep-1960  REASON FOR VISIT: follow up HISTORY FROM: patient PRIMARY NEUROLOGIST:   HISTORY OF PRESENT ILLNESS: Today 08/22/20:  Kristen Boyle is a 60 year old female with a history of multiple sclerosis associated with bilateral optic neuritis and left hemiparesis with a gait disorder.  She returns today for follow-up.  She was started on Ocrevus.  She states after the infusion she noticed significant changes in her gait or balance.  She needed assistance with ambulation.  Her daughter reports that her balance has improved but she is not back to her baseline before her infusion.  She reports some bladder incontinence but reports that most the time is due to her mobility and not being able to make it to the bathroom in time.  She reports some depression but denies needing medication at this time.  Has been followed by ophthalmology.  Reports blurry vision and needing larger font in order to read.  At home she is able to complete all ADLs independently but at a slower pace.  She returns today for an evaluation.  HISTORY (copied from Dr. Clarisa Kindred note) Kristen Boyle is a 60 year old right-handed black female with history of multiple sclerosis associated with bilateral optic neuritis and a left hemiparesis with a gait disorder. The patient has been lost to follow-up, she was last seen here about 2-1/2 years ago. She was on Betaseron at that time. She has an AFO brace for the left foot but she has not been using it as it is uncomfortable. The patient over time has had a gradual slow change in her functional ability with increased problems with left-sided weakness and a decline in her ability to ambulate. The patient uses a walker with a seat to get around, she did have a fall several days ago when she got up out of bed and got dizzy and may have blacked out. Generally she does not fall while using the walker. She reports no difficulty controlling the bowels or the bladder.  She does have significant visual impairment that she believes has slowly changed over time. She denies any double vision. She reports that both legs have gotten weaker and the left arm has gotten weaker. She has also noted some cognitive changes. Her primary care physician has set her up for some physical therapy which has not yet started. She comes to this office for further evaluation. She has not been on any disease modifying agents. In the past, she has been positive for the JC viral antibody.  REVIEW OF SYSTEMS: Out of a complete 14 system review of symptoms, the patient complains only of the following symptoms, and all other reviewed systems are negative.  See HPI  ALLERGIES: Allergies  Allergen Reactions   Gilenya [Fingolimod]     Stomach upset    HOME MEDICATIONS: Outpatient Medications Prior to Visit  Medication Sig Dispense Refill   ibuprofen (ADVIL,MOTRIN) 200 MG tablet Take 400 mg by mouth every 6 (six) hours as needed (For pain.).     ocrelizumab (OCREVUS) 300 MG/10ML injection Inject into the vein.     No facility-administered medications prior to visit.    PAST MEDICAL HISTORY: Past Medical History:  Diagnosis Date   Abnormality of gait 06/10/2012   Depression    Headache    Hyperlipidemia    MS (multiple sclerosis) (HCC)     PAST SURGICAL HISTORY: Past Surgical History:  Procedure Laterality Date   CHOLECYSTECTOMY N/A 06/20/2015   Procedure: LAPAROSCOPIC CHOLECYSTECTOMY;  Surgeon: Jed Limerick  Derrell Lolling, MD;  Location: [facility];  Service: General;  Laterality: N/A;   DENTAL SURGERY     TUBAL LIGATION Bilateral     FAMILY HISTORY: Family History  Problem Relation Age of Onset   Alcoholism Father    Diabetes Paternal Aunt    Dementia Paternal Grandmother        Senile dementia of Alzheimer's type   Colon cancer Cousin    Colon cancer Maternal Aunt     SOCIAL HISTORY: Social History   Socioeconomic History   Marital status: Single    Spouse name: Not on file    Number of children: 4   Years of education: 2-College   Highest education level: Not on file  Occupational History    Comment: Disabled  Tobacco Use   Smoking status: Never   Smokeless tobacco: Never  Substance and Sexual Activity   Alcohol use: No    Alcohol/week: 0.0 standard drinks   Drug use: No   Sexual activity: Not Currently  Other Topics Concern   Not on file  Social History Narrative   Patient lives at home with her daughter.   Disabled.   Education two years of college.   Right handed.   Caffeine 1-2 cups caffeine daily   Social Determinants of Health   Financial Resource Strain: Not on file  Food Insecurity: Not on file  Transportation Needs: Not on file  Physical Activity: Not on file  Stress: Not on file  Social Connections: Not on file  Intimate Partner Violence: Not on file      PHYSICAL EXAM  Vitals:   08/22/20 0932  BP: 130/85  Pulse: 71  Weight: 146 lb (66.2 kg)  Height: 5\' 3"  (1.6 m)   Body mass index is 25.86 kg/m.  Generalized: Well developed, in no acute distress   Neurological examination  Mentation: Alert oriented to time, place, history taking. Follows all commands speech and language fluent Cranial nerve II-XII: Pupils were equal round reactive to light. Extraocular movements were full, visual field were full on confrontational test. Facial sensation and strength were normal. Uvula tongue midline. Head turning and shoulder shrug  were normal and symmetric. Motor: The motor testing reveals 5 over 5 strength on the right side.  The strength in the left arm but weakness in the hands. 3-4/5 strength in the left lower extremity.  Left foot drop noted.  Has an AFO brace but does not wear it Sensory: Sensory testing is intact to soft touch on all 4 extremities. No evidence of extinction is noted.  Coordination: Cerebellar testing reveals good finger-nose-finger laterally.  Good heel-to-shin with the right leg.  Unable to perform on the left  due to weakness Gait and station: Patient's gait is unsteady with a circumduction gait with the left leg.  Uses a Rollator when ambulating. Reflexes: Deep tendon reflexes are symmetric and normal bilaterally.   DIAGNOSTIC DATA (LABS, IMAGING, TESTING) - I reviewed patient records, labs, notes, testing and imaging myself where available.  Lab Results  Component Value Date   WBC 3.1 (L) 04/06/2020   HGB 9.6 (L) 04/06/2020   HCT 32.8 (L) 04/06/2020   MCV 75 (L) 04/06/2020   PLT 317 04/06/2020      Component Value Date/Time   NA 143 03/23/2020 1432   K 3.8 03/23/2020 1432   CL 107 (H) 03/23/2020 1432   CO2 21 03/23/2020 1432   GLUCOSE 104 (H) 03/23/2020 1432   GLUCOSE 93 04/16/2015 2254   BUN 10 03/23/2020  1432   CREATININE 0.87 03/23/2020 1432   CREATININE 0.89 09/09/2013 1115   CALCIUM 9.6 03/23/2020 1432   PROT 7.4 03/23/2020 1432   ALBUMIN 4.4 03/23/2020 1432   AST 18 03/23/2020 1432   ALT 19 03/23/2020 1432   ALKPHOS 92 03/23/2020 1432   BILITOT 0.2 03/23/2020 1432   GFRNONAA 84 12/01/2019 1625   GFRNONAA 73 09/09/2013 1115   GFRAA 96 12/01/2019 1625   GFRAA 84 09/09/2013 1115   Lab Results  Component Value Date   CHOL 248 (H) 12/01/2019   HDL 80 12/01/2019   LDLCALC 153 (H) 12/01/2019   TRIG 89 12/01/2019   CHOLHDL 3.1 12/01/2019   Lab Results  Component Value Date   HGBA1C 5.4 09/17/2010   No results found for: VITAMINB12 Lab Results  Component Value Date   TSH 2.410 11/01/2015      ASSESSMENT AND PLAN 60 y.o. year old female  has a past medical history of Abnormality of gait (06/10/2012), Depression, Headache, Hyperlipidemia, and MS (multiple sclerosis) (HCC). here with:  Multiple sclerosis-secondary progressive  -MRI of the brain and cervical spine to look for progression -Blood work today  2.  Iron deficiency anemia  -We will check iron levels today -Continue iron supplement  Follow-up in 2 to 3 months with Dr. Nechama Guard, MSN, NP-C 08/22/2020, 9:37 AM [facility] [address] [place], [place] [address] [phone]

## 2020-08-22 NOTE — Progress Notes (Signed)
I have read the note, and I agree with the clinical assessment and plan.  Shaela Boer K Larz Mark   

## 2020-08-22 NOTE — Patient Instructions (Signed)
Your Plan: MRI brain and cervical spine Blood work today If your symptoms worsen or you develop new symptoms please let us know.       Thank you for coming to see [place] at [facility]. I hope we have been able to provide you high quality care today.  You may receive a patient satisfaction survey over the next few weeks. We would appreciate your feedback and comments so that we may continue to improve ourselves and the health of our patients.

## 2020-08-22 NOTE — Telephone Encounter (Signed)
MRI brain w/wo contrast & MRI cervical spine w/wo contrast medicare/medicaid- sent to [facility] for scheduling (they obtain medicaid, medicare npr)

## 2020-08-23 LAB — CBC WITH DIFFERENTIAL/PLATELET
Basophils Absolute: 0 10*3/uL (ref 0.0–0.2)
Basos: 1 %
EOS (ABSOLUTE): 0.1 10*3/uL (ref 0.0–0.4)
Eos: 3 %
Hematocrit: 40.1 % (ref 34.0–46.6)
Hemoglobin: 12.3 g/dL (ref 11.1–15.9)
Immature Grans (Abs): 0 10*3/uL (ref 0.0–0.1)
Immature Granulocytes: 0 %
Lymphocytes Absolute: 2.8 10*3/uL (ref 0.7–3.1)
Lymphs: 60 %
MCH: 23.1 pg — ABNORMAL LOW (ref 26.6–33.0)
MCHC: 30.7 g/dL — ABNORMAL LOW (ref 31.5–35.7)
MCV: 75 fL — ABNORMAL LOW (ref 79–97)
Monocytes Absolute: 0.4 10*3/uL (ref 0.1–0.9)
Monocytes: 9 %
Neutrophils Absolute: 1.2 10*3/uL — ABNORMAL LOW (ref 1.4–7.0)
Neutrophils: 27 %
Platelets: 299 10*3/uL (ref 150–450)
RBC: 5.32 x10E6/uL — ABNORMAL HIGH (ref 3.77–5.28)
RDW: 18.5 % — ABNORMAL HIGH (ref 11.7–15.4)
WBC: 4.6 10*3/uL (ref 3.4–10.8)

## 2020-08-23 LAB — IRON AND TIBC
Iron Saturation: 20 % (ref 15–55)
Iron: 83 ug/dL (ref 27–139)
Total Iron Binding Capacity: 405 ug/dL (ref 250–450)
UIBC: 322 ug/dL (ref 118–369)

## 2020-08-23 LAB — FERRITIN: Ferritin: 23 ng/mL (ref 15–150)

## 2020-08-23 LAB — COMPREHENSIVE METABOLIC PANEL
ALT: 40 IU/L — ABNORMAL HIGH (ref 0–32)
AST: 31 IU/L (ref 0–40)
Albumin/Globulin Ratio: 1.4 (ref 1.2–2.2)
Albumin: 4.3 g/dL (ref 3.8–4.8)
Alkaline Phosphatase: 116 IU/L (ref 44–121)
BUN/Creatinine Ratio: 18 (ref 12–28)
BUN: 17 mg/dL (ref 8–27)
Bilirubin Total: <0.2 mg/dL (ref 0.0–1.2)
CO2: 23 mmol/L (ref 20–29)
Calcium: 9.8 mg/dL (ref 8.7–10.3)
Chloride: 105 mmol/L (ref 96–106)
Creatinine, Ser: 0.94 mg/dL (ref 0.57–1.00)
Globulin, Total: 3 g/dL (ref 1.5–4.5)
Glucose: 76 mg/dL (ref 65–99)
Potassium: 4.5 mmol/L (ref 3.5–5.2)
Sodium: 144 mmol/L (ref 134–144)
Total Protein: 7.3 g/dL (ref 6.0–8.5)
eGFR: 69 mL/min/{1.73_m2} (ref >59)

## 2020-08-30 ENCOUNTER — Telehealth: Payer: Self-pay | Admitting: *Deleted

## 2020-08-30 DIAGNOSIS — G35 Multiple sclerosis: Secondary | ICD-10-CM

## 2020-08-30 NOTE — Telephone Encounter (Signed)
I called pt and relayed via VM that need additional lab work (IgG IgA IgM) for her to have her ocrevus (09-28-20) come in M-th 8-12 then [id number].  She is to call back for questions.

## 2020-08-30 NOTE — Telephone Encounter (Signed)
What do you need from me?

## 2020-08-30 NOTE — Telephone Encounter (Signed)
I need this patient to come and get her IGG labs done or send someone to her to get them before I can process her restart for Ocrevus. Scheduled 09-28-20.

## 2020-08-31 NOTE — Telephone Encounter (Signed)
I called daughter relayed that we needed more labs on her mother prior to Ocrevus infusion scheduled 09-28-20, she will bring her in next wk or week after.  She verbalized understanding.   Gave her the days and hours of [facility], no fridays.

## 2020-09-06 ENCOUNTER — Other Ambulatory Visit: Payer: Self-pay

## 2020-09-06 ENCOUNTER — Ambulatory Visit
Admission: RE | Admit: 2020-09-06 | Discharge: 2020-09-06 | Disposition: A | Discharge status: Home / Self Care | Payer: Medicare Other | Source: Ambulatory Visit | Attending: Adult Health | Admitting: Adult Health

## 2020-09-06 DIAGNOSIS — G35 Multiple sclerosis: Secondary | ICD-10-CM

## 2020-09-06 MED ORDER — GADOBENATE DIMEGLUMINE 529 MG/ML IV SOLN
13.0000 mL | Freq: Once | INTRAVENOUS | Status: AC | PRN
  Administered 2020-09-06: 13 mL via INTRAVENOUS

## 2020-09-07 ENCOUNTER — Telehealth: Payer: Self-pay | Admitting: Neurology

## 2020-09-07 DIAGNOSIS — E079 Disorder of thyroid, unspecified: Secondary | ICD-10-CM

## 2020-09-07 DIAGNOSIS — E0789 Other specified disorders of thyroid: Secondary | ICD-10-CM

## 2020-09-07 NOTE — Telephone Encounter (Signed)
I called the patient.  MRI of the cervical spine and brain are unchanged from 2019.  There appears to be a small enhancing focus in the left thyroid, the patient is amenable to getting an ultrasound, I will get this set up.   MRI cervical 09/07/20:  IMPRESSION: This MRI of the cervical spine with and without contrast shows the following: 1.   Multiple T2/FLAIR hyperintense foci in the brainstem and spinal cord is detailed above consistent with chronic demyelination associated with multiple sclerosis.  None of the foci enhance. 2.   4 mm enhancing focus in the left hemithyroid.  Although most likely an incidental finding, consider ultrasound to further evaluate. 3.   No acute findings.   MRI brain 09/07/20:  IMPRESSION: This MRI of the brain with and without contrast shows the following: 1.   Multiple T2/FLAIR hyperintense foci in the hemispheres, brainstem and cerebellum in a pattern consistent with chronic demyelinating plaque associated with multiple sclerosis.  None of the foci enhance or appear to be acute.  Compared to the MRI from 03/12/2017, there are no new lesions. 2.   No acute findings.  Normal enhancement pattern.

## 2020-09-12 ENCOUNTER — Telehealth: Payer: Self-pay | Admitting: Neurology

## 2020-09-12 ENCOUNTER — Ambulatory Visit
Admission: RE | Admit: 2020-09-12 | Discharge: 2020-09-12 | Disposition: A | Discharge status: Home / Self Care | Payer: Medicare Other | Source: Ambulatory Visit | Attending: Neurology | Admitting: Neurology

## 2020-09-12 DIAGNOSIS — E0789 Other specified disorders of thyroid: Secondary | ICD-10-CM

## 2020-09-12 DIAGNOSIS — E041 Nontoxic single thyroid nodule: Secondary | ICD-10-CM | POA: Diagnosis not present

## 2020-09-12 DIAGNOSIS — E079 Disorder of thyroid, unspecified: Secondary | ICD-10-CM

## 2020-09-12 NOTE — Telephone Encounter (Signed)
  I called the patient.  The thyroid ultrasound showed a very small nodule in the left thyroid, does not appear to be significant enough to monitor.  Thyroid US 09/12/20:  IMPRESSION: Solitary subcentimeter left thyroid nodule does not meet criteria for imaging surveillance or FNA.

## 2020-09-13 NOTE — Telephone Encounter (Signed)
LMVM for daughter of pt to return call. (Pt needing labs).

## 2020-09-13 NOTE — Telephone Encounter (Signed)
Called and LMVM for pt's daughter, Elonda Husky about pt, (her mom) coming in for labs, needed prior to her appt for infusion scheduled 09-28-20.  Please come in this week, as will need to get results back prior to infusion.

## 2020-09-14 NOTE — Telephone Encounter (Signed)
LMVM for pt and or daughter that needs to come in for labs prior to having her infusion scheduled 09-28-20. (Home #).

## 2020-09-21 ENCOUNTER — Other Ambulatory Visit (INDEPENDENT_AMBULATORY_CARE_PROVIDER_SITE_OTHER): Payer: Self-pay

## 2020-09-21 DIAGNOSIS — G35 Multiple sclerosis: Secondary | ICD-10-CM | POA: Diagnosis not present

## 2020-09-21 DIAGNOSIS — Z0289 Encounter for other administrative examinations: Secondary | ICD-10-CM

## 2020-09-22 LAB — IGG, IGA, IGM
IgA/Immunoglobulin A, Serum: 373 mg/dL — ABNORMAL HIGH (ref 87–352)
IgG (Immunoglobin G), Serum: 1557 mg/dL (ref 586–1602)
IgM (Immunoglobulin M), Srm: 112 mg/dL (ref 26–217)

## 2020-11-16 ENCOUNTER — Telehealth: Payer: Self-pay | Admitting: *Deleted

## 2020-11-16 NOTE — Telephone Encounter (Signed)
Received prescription clarification request from [facility].  Directions: OCREVUS 300 mg,/10 mL.  infuse 600 mg in 500 mL 0.9% IV at so the 63mL/hour and increase by 40 mL/hour every 30 minutes (max 200 mL/hour (over at least 3.5 hours as directed every 6 months.  Faxed back to [phone] with confirmation.  Phone [phone].  ICD G35.

## 2020-11-17 ENCOUNTER — Ambulatory Visit (INDEPENDENT_AMBULATORY_CARE_PROVIDER_SITE_OTHER): Payer: Medicare Other | Admitting: Neurology

## 2020-11-17 ENCOUNTER — Encounter: Payer: Self-pay | Admitting: Neurology

## 2020-11-17 VITALS — BP 118/82 | HR 75 | Ht 60.0 in | Wt 151.1 lb

## 2020-11-17 DIAGNOSIS — Z5181 Encounter for therapeutic drug level monitoring: Secondary | ICD-10-CM

## 2020-11-17 DIAGNOSIS — G35 Multiple sclerosis: Secondary | ICD-10-CM

## 2020-11-17 NOTE — Progress Notes (Signed)
Reason for visit: Multiple sclerosis  Kristen Boyle is an 60 y.o. female  History of present illness:  Kristen Boyle is a 60 year old right-handed black female with a history of multiple sclerosis.  The patient has a history of prior bilateral optic neuritis and a left hemiparesis.  The patient received her loading doses of Ocrevus about 6 months ago and unfortunately developed increased weakness with the left side.  Since that time, she has had difficulty with walking, she is following frequently and noting that the left leg will collapse on her.  She has not had any changes in vision per se.  She does note some chronic left leg numbness that predated the Ocrevus injection.  The patient comes to the office today for further evaluation.  She does have an AFO brace for the left foot but she does not wear it as it is uncomfortable for her.  She uses a walker for ambulation.  She does have some urinary incontinence at times, she does not have full sensation of the bladder.  She has to get up at night on a scheduled basis to avoid the bladder.  Past Medical History:  Diagnosis Date   Abnormality of gait 06/10/2012   Depression    Headache    Hyperlipidemia    MS (multiple sclerosis) (HCC)     Past Surgical History:  Procedure Laterality Date   CHOLECYSTECTOMY N/A 06/20/2015   Procedure: LAPAROSCOPIC CHOLECYSTECTOMY;  Surgeon: Axel Filler, MD;  Location: [facility];  Service: General;  Laterality: N/A;   DENTAL SURGERY     TUBAL LIGATION Bilateral     Family History  Problem Relation Age of Onset   Alcoholism Father    Diabetes Paternal Aunt    Dementia Paternal Grandmother        Senile dementia of Alzheimer's type   Colon cancer Cousin    Colon cancer Maternal Aunt     Social history:  reports that she has never smoked. She has never used smokeless tobacco. She reports that she does not drink alcohol and does not use drugs.    Allergies  Allergen Reactions   Gilenya  [Fingolimod]     Stomach upset    Medications:  Prior to Admission medications   Medication Sig Start Date End Date Taking? Authorizing Provider  ibuprofen (ADVIL,MOTRIN) 200 MG tablet Take 400 mg by mouth every 6 (six) hours as needed (For pain.).   Yes [provider]  ocrelizumab (OCREVUS) 300 MG/10ML injection Inject into the vein. Patient not taking: Reported on 11/17/2020    [provider]    ROS:  Out of a complete 14 system review of symptoms, the patient complains only of the following symptoms, and all other reviewed systems are negative.  Walking difficulty Bladder control problems Decreased vision  Blood pressure 118/82, pulse 75, height 5' (1.524 m), weight 151 lb 2 oz (68.5 kg), SpO2 98 %.  Physical Exam  General: The patient is alert and cooperative at the time of the examination.  Skin: No significant peripheral edema is noted.   Neurologic Exam  Mental status: The patient is alert and oriented x 3 at the time of the examination. The patient has apparent normal recent and remote memory, with an apparently normal attention span and concentration ability.   Cranial nerves: Facial symmetry is present. Speech is normal, no aphasia or dysarthria is noted. Extraocular movements are full. Visual fields are full.  Motor: The patient has good strength in the right  arm and leg, patient has decreased grip on the left hand, better strength with proximal muscles of the left arm.  The patient has significant weakness of the left leg with inability to flex the left leg against gravity, she has 4 -/5 strength with knee extension, she has a prominent foot drop on the left.  The patient has 4/5 strength with hip flexion on the right.  Sensory examination: Soft touch sensation is symmetric on the face, arms, and legs.  Coordination: The patient has fairly good finger-nose-finger with the right hand, some difficulty and slight dysmetria with the left hand.  The  patient is unable perform heel-to-shin with the left leg, she has some difficulty but is able to perform heel-to-shin with the right leg.  Gait and station: The patient has a somewhat wide-based gait.  The patient drags to the left foot with walking, she will stumble frequently.  She uses a walker to get around.  Reflexes: Deep tendon reflexes are symmetric.   MRI cervical 09/07/20:   IMPRESSION: This MRI of the cervical spine with and without contrast shows the following: 1.   Multiple T2/FLAIR hyperintense foci in the brainstem and spinal cord is detailed above consistent with chronic demyelination associated with multiple sclerosis.  None of the foci enhance. 2.   4 mm enhancing focus in the left hemithyroid.  Although most likely an incidental finding, consider ultrasound to further evaluate. 3.   No acute findings.     MRI brain 09/07/20:   IMPRESSION: This MRI of the brain with and without contrast shows the following: 1.   Multiple T2/FLAIR hyperintense foci in the hemispheres, brainstem and cerebellum in a pattern consistent with chronic demyelinating plaque associated with multiple sclerosis.  None of the foci enhance or appear to be acute.  Compared to the MRI from 03/12/2017, there are no new lesions. 2.   No acute findings.  Normal enhancement pattern.   * MRI scan images were reviewed online. I agree with the written report.    Assessment/Plan:  1.  Multiple sclerosis  2.  Left hemiparesis  3.  Gait disturbance  The patient appeared to have some worsening of symptoms around the time of the initial infusion of Ocrevus.  The patient has undergone some physical therapy but she still is at risk for falls.  She should be able to significantly benefit from an AFO brace, she will need to take her brace back to the vendor and see if it can be altered to make it more comfortable.  The patient will get her next Ocrevus injection.  We discussed the possibility of using Kesimpta.  The  patient will have blood work done today.  She will follow-up with Dr. Epimenio Foot in 2 to 3 months.  Marlan Palau MD 11/17/2020 9:55 AM  [facility] [address] [place], [place] [address]  Phone [phone] Fax [phone]

## 2020-11-18 LAB — IGG, IGA, IGM
IgA/Immunoglobulin A, Serum: 338 mg/dL (ref 87–352)
IgG (Immunoglobin G), Serum: 1340 mg/dL (ref 586–1602)
IgM (Immunoglobulin M), Srm: 100 mg/dL (ref 26–217)

## 2020-11-18 LAB — CBC WITH DIFFERENTIAL/PLATELET
Basophils Absolute: 0.1 10*3/uL (ref 0.0–0.2)
Basos: 1 %
EOS (ABSOLUTE): 0.1 10*3/uL (ref 0.0–0.4)
Eos: 2 %
Hematocrit: 39 % (ref 34.0–46.6)
Hemoglobin: 12.1 g/dL (ref 11.1–15.9)
Immature Grans (Abs): 0 10*3/uL (ref 0.0–0.1)
Immature Granulocytes: 0 %
Lymphocytes Absolute: 2.3 10*3/uL (ref 0.7–3.1)
Lymphs: 54 %
MCH: 24.3 pg — ABNORMAL LOW (ref 26.6–33.0)
MCHC: 31 g/dL — ABNORMAL LOW (ref 31.5–35.7)
MCV: 79 fL (ref 79–97)
Monocytes Absolute: 0.5 10*3/uL (ref 0.1–0.9)
Monocytes: 11 %
Neutrophils Absolute: 1.4 10*3/uL (ref 1.4–7.0)
Neutrophils: 32 %
Platelets: 285 10*3/uL (ref 150–450)
RBC: 4.97 x10E6/uL (ref 3.77–5.28)
RDW: 15.3 % (ref 11.7–15.4)
WBC: 4.3 10*3/uL (ref 3.4–10.8)

## 2020-11-18 LAB — COMPREHENSIVE METABOLIC PANEL
ALT: 31 IU/L (ref 0–32)
AST: 29 IU/L (ref 0–40)
Albumin/Globulin Ratio: 1.5 (ref 1.2–2.2)
Albumin: 4.3 g/dL (ref 3.8–4.8)
Alkaline Phosphatase: 118 IU/L (ref 44–121)
BUN/Creatinine Ratio: 15 (ref 12–28)
BUN: 16 mg/dL (ref 8–27)
Bilirubin Total: <0.2 mg/dL (ref 0.0–1.2)
CO2: 24 mmol/L (ref 20–29)
Calcium: 9.2 mg/dL (ref 8.7–10.3)
Chloride: 108 mmol/L — ABNORMAL HIGH (ref 96–106)
Creatinine, Ser: 1.06 mg/dL — ABNORMAL HIGH (ref 0.57–1.00)
Globulin, Total: 2.8 g/dL (ref 1.5–4.5)
Glucose: 78 mg/dL (ref 70–99)
Potassium: 3.8 mmol/L (ref 3.5–5.2)
Sodium: 146 mmol/L — ABNORMAL HIGH (ref 134–144)
Total Protein: 7.1 g/dL (ref 6.0–8.5)
eGFR: 59 mL/min/{1.73_m2} — ABNORMAL LOW (ref >59)

## 2020-12-05 ENCOUNTER — Telehealth: Payer: Self-pay | Admitting: *Deleted

## 2020-12-05 NOTE — Telephone Encounter (Signed)
Per Freddi Starr, RN, she has been unable to reach patient to schedule Ocrevus. I called, LVM advising patient to call our infusion suite to schedule her Ocrevus infusion. Gave her the direct line [phone].

## 2021-01-24 NOTE — Telephone Encounter (Signed)
Left detailed message relaying mychart message I sent to pt. Asked her to either call back or reply to mychart message to confirm she received message and to discuss next steps.

## 2021-01-26 ENCOUNTER — Encounter: Payer: Self-pay | Admitting: Internal Medicine

## 2021-01-26 ENCOUNTER — Ambulatory Visit (INDEPENDENT_AMBULATORY_CARE_PROVIDER_SITE_OTHER): Payer: Medicare Other | Admitting: Internal Medicine

## 2021-01-26 ENCOUNTER — Other Ambulatory Visit: Payer: Self-pay

## 2021-01-26 VITALS — BP 119/78 | HR 84 | Temp 97.8°F | Ht 60.0 in | Wt 145.7 lb

## 2021-01-26 DIAGNOSIS — H539 Unspecified visual disturbance: Secondary | ICD-10-CM

## 2021-01-26 DIAGNOSIS — G35 Multiple sclerosis: Secondary | ICD-10-CM | POA: Diagnosis not present

## 2021-01-26 DIAGNOSIS — E782 Mixed hyperlipidemia: Secondary | ICD-10-CM | POA: Diagnosis not present

## 2021-01-26 DIAGNOSIS — Z1231 Encounter for screening mammogram for malignant neoplasm of breast: Secondary | ICD-10-CM

## 2021-01-26 DIAGNOSIS — Z1211 Encounter for screening for malignant neoplasm of colon: Secondary | ICD-10-CM

## 2021-01-26 DIAGNOSIS — Z0000 Encounter for general adult medical examination without abnormal findings: Secondary | ICD-10-CM

## 2021-01-26 NOTE — Assessment & Plan Note (Signed)
She is due for a colonoscopy, mammogram, and pap smear. Discussed that I would recommend deferring this til next visit and focus on treating her MS at this time. Will also defer flu vaccine due to upcoming ocrevus infusion.

## 2021-01-26 NOTE — Assessment & Plan Note (Signed)
Patient presents today with pain of her left leg and head soreness. She describes a scalp tenderness over the top of her head. Daughter describes pain when she tries brushing her hair. She is able to ambulate despite her leg pain but states her left leg drags. She does require an assistive walking device. Denies any fevers, chills, eye pain. Does endorse worsening vision, specifically visual depth. She also has noticed weakness in writing, she is unable to hold a pen enough to write. Daughter assists her at home with ADLs and iADLs. She tried ocrevus last year and thought she did not tolerate this well but is planning on restarting this infusion end of this month. She is scheduled for 1/29.   Assessment/plan: I suspect all of her symptoms are progression of MS. I recommended she continue to follow with [facility] and in the interim can try ibuprofen for the scalp tenderness.   -Refer to PT and OT for strength training  -Follows with [facility]

## 2021-01-26 NOTE — Progress Notes (Signed)
° °  CC: leg pain, head soreness  HPI:  [name] is a 61 y.o. with a PMHx listed below presenting for leg pain and head sorenes. For details of today's visit and the status of his chronic medical issues please refer to the assessment and plan.   Past Medical History:  Diagnosis Date   Abnormality of gait 06/10/2012   Depression    Headache    Hyperlipidemia    MS (multiple sclerosis) (HCC)    Review of Systems:   Review of Systems  Constitutional:  Negative for chills and fever.  Eyes:  Positive for blurred vision. Negative for double vision, photophobia and pain.  Musculoskeletal:  Positive for joint pain and myalgias. Negative for falls.  Neurological:  Positive for weakness and headaches.    Physical Exam:  Vitals:   01/26/21 1100  BP: 119/78  Pulse: 84  Temp: 97.8 F (36.6 C)  TempSrc: Oral  SpO2: 99%  Weight: 145 lb 11.2 oz (66.1 kg)  Height: 5' (1.524 m)   Physical Exam General: alert, appears stated age, in no acute distress HEENT: Normocephalic, atraumatic, EOM intact, conjunctiva normal CV: Regular rate and rhythm, no murmurs rubs or gallops Pulm: Clear to auscultation bilaterally, normal work of breathing Abdomen: Soft, nondistended, bowel sounds present, no tenderness to palpation MSK: No lower extremity edema, right LE strength 0/5, left LE strength 4/5, UE strength 5/5 bilaterally  Skin: Warm and dry Neuro: Alert and oriented x3   Assessment & Plan:   See Encounters Tab for problem based charting.  Patient discussed with Dr. Mikey Bussing

## 2021-01-26 NOTE — Assessment & Plan Note (Signed)
Based on previous lipid panel, ASCVD risk is <7.5% (3.3-5.3%) and moderate intensity or lower statin recommended. Looks like patient was on crestor 10 mg 2 years ago but is no longer on any medications. Recommend we check a lipid panel at her next visit once her MS symptoms are better controlled.

## 2021-01-26 NOTE — Patient Instructions (Addendum)
I have sent in a referral for PT and OT.  I will defer the mammogram and colonoscopy until we get your treatment regimen for MS under better control.

## 2021-01-27 NOTE — Addendum Note (Signed)
Addended by: Jaci Standard on: 01/27/2021 06:08 PM   Modules accepted: Orders

## 2021-01-29 NOTE — Progress Notes (Signed)
Internal Medicine Clinic Attending ° °Case discussed with Dr. Rehman  At the time of the visit.  We reviewed the resident’s history and exam and pertinent patient test results.  I agree with the assessment, diagnosis, and plan of care documented in the resident’s note.  ° °

## 2021-02-14 DIAGNOSIS — G35 Multiple sclerosis: Secondary | ICD-10-CM | POA: Diagnosis not present

## 2021-02-27 DIAGNOSIS — G35 Multiple sclerosis: Secondary | ICD-10-CM | POA: Diagnosis not present

## 2021-04-04 ENCOUNTER — Ambulatory Visit: Payer: Medicare Other | Admitting: Neurology

## 2021-04-11 ENCOUNTER — Ambulatory Visit (INDEPENDENT_AMBULATORY_CARE_PROVIDER_SITE_OTHER): Payer: Medicare Other | Admitting: Neurology

## 2021-04-11 ENCOUNTER — Encounter: Payer: Self-pay | Admitting: Neurology

## 2021-04-11 VITALS — BP 143/90 | HR 69 | Ht 60.0 in | Wt 138.0 lb

## 2021-04-11 DIAGNOSIS — Z5181 Encounter for therapeutic drug level monitoring: Secondary | ICD-10-CM

## 2021-04-11 DIAGNOSIS — G35 Multiple sclerosis: Secondary | ICD-10-CM

## 2021-04-11 DIAGNOSIS — G4700 Insomnia, unspecified: Secondary | ICD-10-CM

## 2021-04-11 DIAGNOSIS — Z79899 Other long term (current) drug therapy: Secondary | ICD-10-CM | POA: Insufficient documentation

## 2021-04-11 DIAGNOSIS — R350 Frequency of micturition: Secondary | ICD-10-CM | POA: Diagnosis not present

## 2021-04-11 MED ORDER — GABAPENTIN 300 MG PO CAPS: 300.0000 mg | ORAL_CAPSULE | Freq: Every day | ORAL | 3 refills | Status: DC

## 2021-04-11 NOTE — Progress Notes (Signed)
? ?[facility] ? ?PATIENT: [name] ?DOB: Oct 02, 1960 ? ?REFERRING DOCTOR OR PCP:  Elza Rafter, DO ?SOURCE: patient, notes from Dr. Anne Hahn ? ?_________________________________ ? ? ?HISTORICAL ? ?CHIEF COMPLAINT:  ?Chief Complaint  ?Patient presents with  ? Transfer of care from Dr. Stephanie Acre  ?  RM. Last saw Dr. Anne Hahn 11/17/20. Pt is on Ocrevus and last infusion was in feb and next scheduled 08/28/21. Received infusion at [facility]. She has complains of pain in arm/shoulder area. It is intermittant. She describes pain in knees and makes difficult to walk.   ? ? ?HISTORY OF PRESENT ILLNESS:  ?She is a 61 y.o. woman with multiple sclerosis diagnosed in 1999.  She is transferring car efrom Dr. Anne Hahn who recently retired ? ?She is on Ocrevus.   She tolerates the IV therapy well.    She started the Ocrevus last year, switching from [place].    Her next infusion is August.  ? ?Currently, she is able to walk with a walker and can go over 100 feet.   She has left > right leg weakness.  She notes mild hand weakness.   She has numbness in her left hand and both legs.   She has urinary incontinence so wears Depends   She has diplopia and has had notable dysconjugate gaze x 5-6 years.   Her left eye has very poor vision.  Visual symptoms OS started before the MS diagnosis but were mild until more recently.    ? ?She has difficulty with sleep maintenance insomnia.   She has never been on any medication.   She is uncomfortable at night due to hot flashes.    She has mild depression and is irritable.    She has mild cognitive dysfunction.   She notes processing more slowly and trouble coming up with words.     ? ?She is noting some shoulder tenderness/pain.  No change in strength recently.  Also has knee pain ? ? ?MS HISTORY: ?She was diagnosed in 1999 after presenting with a+cute left optic neuritis.Vision improved after steroids.   She had an MRI and lumbar puncture c/w MS.  She was placed on  Betaseron.  She did not tolerate it too well and had flu-like issues..   Between 2000 and 2010 she had several exacerbations.  Once she had a drop foot, gait issues slowly progressed.    Vision also progressively worsened.    She went on Gilenya in 2011 but had bumps on her skin and GI issues and stopped.   She went back to Betaseron.  Tysabri had been discussed but she was JCV Ab positive.    In 2022 she was switched to Ocrevus.   ? ? ?IMAGING personally reviewed: ?MRI 8/172022 showed Multiple T2/FLAIR hyperintense foci in the hemispheres, brainstem and cerebellum in a pattern consistent with chronic demyelinating plaque associated with multiple sclerosis.  None of the foci enhance or appear to be acute.  Compared to the MRI from 03/12/2017, there are no new lesions. ? ?MRI of the cervical spine showed Multiple T2/FLAIR hyperintense foci in the brainstem and spinal cord is detailed above consistent with chronic demyelination associated with multiple sclerosis.  None of the foci enhance. ? ?REVIEW OF SYSTEMS: ?Constitutional: No fevers, chills, sweats, or change in appetite ?Eyes: No visual changes, double vision, eye pain ?Ear, nose and throat: No hearing loss, ear pain, nasal congestion, sore throat ?Cardiovascular: No chest pain, palpitations ?Respiratory:  No shortness of breath at rest or  with exertion.   No wheezes ?GastrointestinaI: No nausea, vomiting, diarrhea, abdominal pain, fecal incontinence ?Genitourinary:  No dysuria, urinary retention or frequency.  No nocturia. ?Musculoskeletal:  shoulder and knee pain.   ?Integumentary: No rash, pruritus, skin lesions ?Neurological: as above ?Psychiatric: No depression at this time.  No anxiety ?Endocrine: No palpitations, diaphoresis, change in appetite, change in weigh or increased thirst ?Hematologic/Lymphatic:  No anemia, purpura, petechiae. ?Allergic/Immunologic: No itchy/runny eyes, nasal congestion, recent allergic reactions, rashes ? ?ALLERGIES: ?Allergies   ?Allergen Reactions  ? Gilenya [Fingolimod]   ?  Stomach upset  ? ? ?HOME MEDICATIONS: ? ?Current Outpatient Medications:  ?  ibuprofen (ADVIL,MOTRIN) 200 MG tablet, Take 400 mg by mouth every 6 (six) hours as needed (For pain.)., Disp: , Rfl:  ?  ocrelizumab (OCREVUS) 300 MG/10ML injection, Inject into the vein., Disp: , Rfl:  ? ?PAST MEDICAL HISTORY: ?Past Medical History:  ?Diagnosis Date  ? Abnormality of gait 06/10/2012  ? Depression   ? Headache   ? Hyperlipidemia   ? MS (multiple sclerosis) (HCC)   ? ? ?PAST SURGICAL HISTORY: ?Past Surgical History:  ?Procedure Laterality Date  ? CHOLECYSTECTOMY N/A 06/20/2015  ? Procedure: LAPAROSCOPIC CHOLECYSTECTOMY;  Surgeon: Axel Filler, MD;  Location: [facility];  Service: General;  Laterality: N/A;  ? DENTAL SURGERY    ? TUBAL LIGATION Bilateral   ? ? ?FAMILY HISTORY: ?Family History  ?Problem Relation Age of Onset  ? Alcoholism Father   ? Diabetes Paternal Aunt   ? Dementia Paternal Grandmother   ?     Senile dementia of Alzheimer's type  ? Colon cancer Cousin   ? Colon cancer Maternal Aunt   ? ? ?SOCIAL HISTORY: ? ?Social History  ? ?Socioeconomic History  ? Marital status: Single  ?  Spouse name: Not on file  ? Number of children: 4  ? Years of education: 2-College  ? Highest education level: Not on file  ?Occupational History  ?  Comment: Disabled  ?Tobacco Use  ? Smoking status: Never  ? Smokeless tobacco: Never  ?Substance and Sexual Activity  ? Alcohol use: No  ?  Alcohol/week: 0.0 standard drinks  ? Drug use: No  ? Sexual activity: Not Currently  ?Other Topics Concern  ? Not on file  ?Social History Narrative  ? Patient lives at home with her daughter.  ? Disabled.  ? Education two years of college.  ? Right handed.  ? Caffeine 1-2 cups caffeine daily  ? ?Social Determinants of Health  ? ?Financial Resource Strain: Not on file  ?Food Insecurity: Not on file  ?Transportation Needs: Not on file  ?Physical Activity: Not on file  ?Stress: Not on file  ?Social  Connections: Not on file  ?Intimate Partner Violence: Not on file  ? ? ? ?PHYSICAL EXAM ? ?Vitals:  ? 04/11/21 0939  ?BP: (!) 143/90  ?Pulse: 69  ?Weight: 138 lb (62.6 kg)  ?Height: 5' (1.524 m)  ? ? ?Body mass index is 26.95 kg/m?. ? ? ?General: The patient is well-developed and well-nourished and in no acute distress ? ?HEENT:  Head is Grand[place]/AT.  Sclera are anicteric.  Funduscopic exam shows normal optic discs and retinal vessels. ? ?Neck: No carotid bruits are noted.  The neck is nontender. ? ?Cardiovascular: The heart has a regular rate and rhythm with a normal S1 and S2. There were no murmurs, gallops or rubs.   ? ?Skin: Extremities are without rash or  edema. ? ?Musculoskeletal:  Back is nontender.  Mild shoulder tenderness.   ? ?Neurologic Exam ? ?Mental status: The patient is alert and oriented x 3 at the time of the examination. The patient has apparent normal recent and remote memory, with an apparently normal attention span and concentration ability.   Speech is normal. ? ?Cranial nerves: Extraocular movements are reduced OS and she has incomplete INO and exophoria.  Pupils show left APD.  Finger counting OS and able to read large print OD.   Reduce colors OS.  Facial symmetry is present. There is good facial sensation to soft touch bilaterally.Facial strength is normal.  Trapezius and sternocleidomastoid strength is normal. No dysarthria is noted.  The tongue is midline, and the patient has symmetric elevation of the soft palate. No obvious hearing deficits are noted. ? ?Motor:  Muscle bulk is normal.   Tone is normal. Strength is  5 / 5 in left arm, 4+ to 5/5 right arm, 2/5 left leg and 4+ right leg decreased RAM in  hands ? ?Sensory: Sensory testing is intact to pinprick, soft touch and vibration sensation in arms but reduced vibration in right leg.   ? ?Coordination: Cerebellar testing reveals reduced  finger-nose-finger and poor right HTS and unable to do left heel-to-shin . ? ?Gait and station: She  needs to use arms to stand up.   Station is ok walk.    Gait requires bilateral support.Cannot tandem walk. Romberg is negative.  ? ?Reflexes: Deep tendon reflexes are symmetric and normal in arms but increased in

## 2021-04-12 LAB — CBC WITH DIFFERENTIAL/PLATELET
Basophils Absolute: 0 10*3/uL (ref 0.0–0.2)
Basos: 1 %
EOS (ABSOLUTE): 0.1 10*3/uL (ref 0.0–0.4)
Eos: 2 %
Hematocrit: 42.6 % (ref 34.0–46.6)
Hemoglobin: 13.5 g/dL (ref 11.1–15.9)
Immature Grans (Abs): 0 10*3/uL (ref 0.0–0.1)
Immature Granulocytes: 0 %
Lymphocytes Absolute: 2.5 10*3/uL (ref 0.7–3.1)
Lymphs: 63 %
MCH: 25.1 pg — ABNORMAL LOW (ref 26.6–33.0)
MCHC: 31.7 g/dL (ref 31.5–35.7)
MCV: 79 fL (ref 79–97)
Monocytes Absolute: 0.3 10*3/uL (ref 0.1–0.9)
Monocytes: 9 %
Neutrophils Absolute: 1 10*3/uL — ABNORMAL LOW (ref 1.4–7.0)
Neutrophils: 25 %
Platelets: 265 10*3/uL (ref 150–450)
RBC: 5.38 x10E6/uL — ABNORMAL HIGH (ref 3.77–5.28)
RDW: 15.1 % (ref 11.7–15.4)
WBC: 3.9 10*3/uL (ref 3.4–10.8)

## 2021-04-12 LAB — IGG, IGA, IGM
IgA/Immunoglobulin A, Serum: 368 mg/dL — ABNORMAL HIGH (ref 87–352)
IgG (Immunoglobin G), Serum: 1475 mg/dL (ref 586–1602)
IgM (Immunoglobulin M), Srm: 93 mg/dL (ref 26–217)

## 2021-08-28 DIAGNOSIS — G35 Multiple sclerosis: Secondary | ICD-10-CM | POA: Diagnosis not present

## 2021-10-04 ENCOUNTER — Ambulatory Visit (INDEPENDENT_AMBULATORY_CARE_PROVIDER_SITE_OTHER): Payer: Medicare Other

## 2021-10-04 VITALS — BP 128/80 | HR 65 | Temp 98.0°F | Ht 60.0 in | Wt 150.6 lb

## 2021-10-04 VITALS — BP 128/80 | HR 65 | Temp 98.0°F | Ht 60.0 in | Wt 150.0 lb

## 2021-10-04 DIAGNOSIS — Z0000 Encounter for general adult medical examination without abnormal findings: Secondary | ICD-10-CM

## 2021-10-04 DIAGNOSIS — E785 Hyperlipidemia, unspecified: Secondary | ICD-10-CM | POA: Diagnosis not present

## 2021-10-04 DIAGNOSIS — G35 Multiple sclerosis: Secondary | ICD-10-CM

## 2021-10-04 DIAGNOSIS — R7989 Other specified abnormal findings of blood chemistry: Secondary | ICD-10-CM

## 2021-10-04 DIAGNOSIS — E559 Vitamin D deficiency, unspecified: Secondary | ICD-10-CM | POA: Diagnosis not present

## 2021-10-04 MED ORDER — GABAPENTIN 300 MG PO CAPS: 300.0000 mg | ORAL_CAPSULE | Freq: Two times a day (BID) | ORAL | 3 refills | Status: DC | PRN

## 2021-10-04 NOTE — Progress Notes (Signed)
   CC: routine f\u  HPI:  [name] is a 61 y.o.-year-old female with past medical history as below presenting for routine f\u.  Please see encounters tab for problem-based charting.  Past Medical History:  Diagnosis Date   Abnormality of gait 06/10/2012   Depression    Headache    Hyperlipidemia    MS (multiple sclerosis) ([place])    Review of Systems: As in HPI.  Please see encounters tab for problem based charting.  Physical Exam:  Vitals:   10/04/21 1041  BP: 128/80  Pulse: 65  Temp: 98 F (36.7 C)  TempSrc: Oral  SpO2: 99%  Weight: 150 lb 9.6 oz (68.3 kg)  Height: 5' (1.524 m)   General:Well-appearing, pleasant, NAD Cardiac: RRR, no murmurs rubs or gallops. Respiratory: Normal work of breathing on room air, CTAB Abdominal: Soft, nontender, nondistended Neuro: 5/5 strength BUE. 5/5 strength RLE knee extension/flexion. Plantarflexion. 3/5 strength LLE knee extension, 4/5 LLE knee flexion, LLE plantarflexion. No clear visual deficits. No apparent muscle fasciculations   Assessment & Plan:   Vitamin D insufficiency Rechecking vit D level today.  Multiple sclerosis ([place]) Endorsing sharp/burning pain in upper extremities without associated spasms. Had been previously prescribed gabapentin 358m QHS but had not been able to pick it up. Previously reported improvement with PT and expressed desire to work with PT/OT again. Weakness mostly in LLE flexion, plantarflexion on exam. -placed referral for PT/OT -gabapentin 3057mBID PRN for pain. Would start baclofen if she endorses spasms down the line.  Additionally reporting occasional black spots in her vision. No clear afferent deficits on exam today. Last MRI without clear signs of optic neuritis. -advised her to contact neurology and see whether they would want repeat MRI optic neuritis protocol before next visit. Repeat vision assessment at that visit.  On Ocrevus and has f\u scheduled with neurology later this  month.   Hyperlipidemia F\u repeat lipid panel  Healthcare maintenance -provided FIT kit today.   Elevated serum creatinine Serum creatinine elevated at last visit, will repeat today.   Patient seen with Dr. MuDaryll Drown

## 2021-10-04 NOTE — Patient Instructions (Signed)
[name], it was a pleasure seeing you today! You endorsed feeling well today. Below are some of the things we talked about this visit. We look forward to seeing you in the follow up appointment!  Today we discussed: MS: I put in the PT and OT referrals and they will contact you. I would get in touch with your neurologists before your appointment and see if they recommend an MRI so you can get it by then. I refilled the gabapentin to your pharmacy and you can take it up to twice a day as needed for pain. If you feel like you are having muscle spasms, let your neurologist know and they can try other medicines.  I will call with your lab results.    I have ordered the following labs today:  Lab Orders  No laboratory test(s) ordered today      Referrals ordered today:   Referral Orders  No referral(s) requested today     I have ordered the following medication/changed the following medications:   Stop the following medications: There are no discontinued medications.   Start the following medications: No orders of the defined types were placed in this encounter.    Follow-up: 6 months   Please make sure to arrive 15 minutes prior to your next appointment. If you arrive late, you may be asked to reschedule.   We look forward to seeing you next time. Please call our clinic at [phone] if you have any questions or concerns. The best time to call is Monday-Friday from 9am-4pm, but there is someone available 24/7. If after hours or the weekend, call the main hospital number and ask for the Internal Medicine Resident On-Call. If you need medication refills, please notify your pharmacy one week in advance and they will send [place] a request.  Thank you for letting us take part in your care. Wishing you the best!  Thank you, Lyndle Herrlich MD

## 2021-10-04 NOTE — Progress Notes (Signed)
Subjective:   Kristen Boyle is a 61 y.o. female who presents for an Initial Medicare Annual Wellness Visit.  Review of Systems    Defer to PCP.        Objective:    Today's Vitals   10/04/21 1132 10/04/21 1133  BP: 128/80   Pulse: 65   Temp: 98 F (36.7 C)   TempSrc: Oral   SpO2: 99%   Weight: 150 lb (68 kg)   Height: 5' (1.524 m)   PainSc:  5    Body mass index is 29.29 kg/m.     10/04/2021   11:35 AM 10/04/2021   10:47 AM 01/26/2021   11:15 AM 02/25/2020    2:12 PM 12/01/2019    2:42 PM 04/12/2016   10:44 AM 11/01/2015    9:47 AM  Advanced Directives  Does Patient Have a Medical Advance Directive? No No No No No No No  Would patient like information on creating a medical advance directive? No - Patient declined No - Patient declined No - Patient declined Yes (MAU/Ambulatory/Procedural Areas - Information given) No - Patient declined No - Patient declined No - patient declined information    Current Medications (verified) Outpatient Encounter Medications as of 10/04/2021  Medication Sig   ibuprofen (ADVIL,MOTRIN) 200 MG tablet Take 400 mg by mouth every 6 (six) hours as needed (For pain.).   ocrelizumab (OCREVUS) 300 MG/10ML injection Inject into the vein.   [DISCONTINUED] gabapentin (NEURONTIN) 300 MG capsule Take 1 capsule (300 mg total) by mouth at bedtime.   No facility-administered encounter medications on file as of 10/04/2021.    Allergies (verified) Gilenya [fingolimod]   History: Past Medical History:  Diagnosis Date   Abnormality of gait 06/10/2012   Depression    Headache    Hyperlipidemia    MS (multiple sclerosis) ([place])    Past Surgical History:  Procedure Laterality Date   CHOLECYSTECTOMY N/A 06/20/2015   Procedure: LAPAROSCOPIC CHOLECYSTECTOMY;  Surgeon: Ralene Ok, MD;  Location: [facility];  Service: General;  Laterality: N/A;   DENTAL SURGERY     TUBAL LIGATION Bilateral    Family History  Problem Relation Age of Onset    Alcoholism Father    Diabetes Paternal Aunt    Dementia Paternal Grandmother        Senile dementia of Alzheimer's type   Colon cancer Cousin    Colon cancer Maternal Aunt    Social History   Socioeconomic History   Marital status: Divorced    Spouse name: Not on file   Number of children: 4   Years of education: 2-College   Highest education level: Not on file  Occupational History    Comment: Disabled  Tobacco Use   Smoking status: Never   Smokeless tobacco: Never  Vaping Use   Vaping Use: Never used  Substance and Sexual Activity   Alcohol use: No    Alcohol/week: 0.0 standard drinks of alcohol   Drug use: No   Sexual activity: Not Currently  Other Topics Concern   Not on file  Social History Narrative   Patient lives at home with her daughter.   Disabled.   Education two years of college.   Right handed.   Social Determinants of Health   Financial Resource Strain: Low Risk  (10/04/2021)   Overall Financial Resource Strain (CARDIA)    Difficulty of Paying Living Expenses: Not hard at all  Food Insecurity: No Food Insecurity (10/04/2021)   Hunger Vital Sign  Worried About Programme researcher, broadcasting/film/video in the Last Year: Never true    Ran Out of Food in the Last Year: Never true  Transportation Needs: No Transportation Needs (10/04/2021)   PRAPARE - Administrator, Civil Service (Medical): No    Lack of Transportation (Non-Medical): No  Physical Activity: Insufficiently Active (10/04/2021)   Exercise Vital Sign    Days of Exercise per Week: 2 days    Minutes of Exercise per Session: 20 min  Stress: No Stress Concern Present (10/04/2021)   [employer] of Occupational Health - Occupational Stress Questionnaire    Feeling of Stress : Only a little  Social Connections: Socially Isolated (10/04/2021)   Social Connection and Isolation Panel [NHANES]    Frequency of Communication with Friends and Family: More than three times a week    Frequency of Social  Gatherings with Friends and Family: More than three times a week    Attends Religious Services: Never    Database administrator or Organizations: No    Attends Engineer, structural: Never    Marital Status: Divorced    Tobacco Counseling Counseling given: Not Answered   Clinical Intake:  Pre-visit preparation completed: Yes  Pain : 0-10 Pain Score: 5  Pain Type: Chronic pain Pain Location: Knee Pain Orientation: Left Pain Descriptors / Indicators: Dull Pain Onset: More than a month ago Pain Frequency: Intermittent     BMI - recorded: 29.29 Nutritional Status: BMI 25 -29 Overweight Nutritional Risks: None Diabetes: No  How often do you need to have someone help you when you read instructions, pamphlets, or other written materials from your doctor or pharmacy?: 1 - Never What is the last grade level you completed in school?: 2 years of college  Diabetic?NO  Interpreter Needed?: No  Information entered by :: Joal Eakle, CMA 10/04/2021   Activities of Daily Living    10/04/2021   11:44 AM 10/04/2021   10:44 AM  In your present state of health, do you have any difficulty performing the following activities:  Hearing? 0 0  Vision? 1 1  Difficulty concentrating or making decisions? 1 1  Walking or climbing stairs? 1 1  Dressing or bathing? 0 0  Doing errands, shopping? 1 1  Preparing Food and eating ? Y   Using the Toilet? Y   In the past six months, have you accidently leaked urine? Y   Do you have problems with loss of bowel control? Y   Managing your Medications? Y   Comment Daughter helps with meds.   Managing your Finances? Y   Comment daughter helps her with this   Housekeeping or managing your Housekeeping? Y     Patient Care Team: Chauncey Mann, DO as PCP - General  Indicate any recent Medical Services you may have received from other than [facility] providers in the past year (date may be approximate).     Assessment:   This is a routine  wellness examination for [name].  Hearing/Vision screen No results found.  Dietary issues and exercise activities discussed: Current Exercise Habits: Home exercise routine, Type of exercise: Other - see comments (Pilates machine), Time (Minutes): 15, Frequency (Times/Week): 2, Weekly Exercise (Minutes/Week): 30, Intensity: Mild   Goals Addressed   None   Depression Screen    10/04/2021   11:56 AM 10/04/2021   11:35 AM 10/04/2021   10:46 AM 01/26/2021   11:11 AM 12/01/2019    3:38 PM 04/12/2016   10:41  AM 11/09/2015    2:34 PM  PHQ 2/9 Scores  PHQ - 2 Score 0 0 0 6 3 6 1   PHQ- 9 Score 0 0 0 16 18 18      Fall Risk    10/04/2021   11:35 AM 10/04/2021   10:46 AM 01/26/2021   11:11 AM 12/01/2019    2:42 PM 04/12/2016   10:41 AM  Fall Risk   Falls in the past year? 1 1 1 1  Yes  Number falls in past yr: 1 1 1 1 1   Injury with Fall? 1 1 1  0 Yes  Risk Factor Category      High Fall Risk  Risk for fall due to : Impaired balance/gait;Impaired mobility Impaired balance/gait;Impaired mobility Impaired balance/gait History of fall(s);Impaired balance/gait;Impaired mobility Impaired balance/gait;History of fall(s)  Follow up Falls evaluation completed Falls evaluation completed Falls evaluation completed Falls evaluation completed Falls prevention discussed  Comment     stumbles sometimes     FALL RISK PREVENTION PERTAINING TO THE HOME:  Any stairs in or around the home? No  If so, are there any without handrails? No  Home free of loose throw rugs in walkways, pet beds, electrical cords, etc? Yes  Adequate lighting in your home to reduce risk of falls? Yes   ASSISTIVE DEVICES UTILIZED TO PREVENT FALLS:  Life alert? No  Use of a cane, walker or w/c? Yes  Grab bars in the bathroom? No  Shower chair or bench in shower? No  Elevated toilet seat or a handicapped toilet? No   TIMED UP AND GO:  Was the test performed? No .  Length of time to ambulate 10 feet: n/a sec.      Cognitive Function:        10/04/2021   11:47 AM  6CIT Screen  What Year? 4 points  What month? 3 points  What time? 0 points  Count back from 20 0 points  Months in reverse 0 points  Repeat phrase 0 points  Total Score 7 points    Immunizations Immunization History  Administered Date(s) Administered   Tdap 03/06/2011    TDAP status: Defer to PCP, Postponed until 10/04/2022.  Flu Vaccine status: Declined, Education has been provided regarding the importance of this vaccine but patient still declined. Advised may receive this vaccine at local pharmacy or Health Dept. Aware to provide a copy of the vaccination record if obtained from local pharmacy or Health Dept. Verbalized acceptance and understanding.   Covid-19 vaccine status: Declined, Education has been provided regarding the importance of this vaccine but patient still declined. Advised may receive this vaccine at local pharmacy or Health Dept.or vaccine clinic. Aware to provide a copy of the vaccination record if obtained from local pharmacy or Health Dept. Verbalized acceptance and understanding.  Qualifies for Shingles Vaccine? Yes   Zostavax completed No   Shingrix Completed?: No.    Education has been provided regarding the importance of this vaccine. Patient has been advised to call insurance company to determine out of pocket expense if they have not yet received this vaccine. Advised may also receive vaccine at local pharmacy or Health Dept. Verbalized acceptance and understanding.  Screening Tests Health Maintenance  Topic Date Due   COLON CANCER SCREENING ANNUAL FOBT  Never done   PAP SMEAR-Modifier  04/13/2019   LIPID PANEL  11/30/2020   INFLUENZA VACCINE  04/21/2022 (Originally 08/21/2021)   MAMMOGRAM  10/04/2022 (Originally 07/09/2017)   COLONOSCOPY (Pts 45-11yrs Insurance coverage will need  to be confirmed)  10/04/2022 (Originally 04/19/2003)   TETANUS/TDAP  10/04/2022 (Originally 03/05/2021)   Zoster  Vaccines- Shingrix (1 of 2) 10/04/2022 (Originally [birth year]18/1977)   Hepatitis C Screening  Completed   HIV Screening  Completed   HPV VACCINES  Aged Out    Health Maintenance  Health Maintenance Due  Topic Date Due   COLON CANCER SCREENING ANNUAL FOBT  Never done   PAP SMEAR-Modifier  04/13/2019   LIPID PANEL  11/30/2020    Colorectal cancer screening: Defer to PCP, Postponed until 10/04/2022.  Mammogram status: Defer to PCP. Postponed until 10/04/2022.   Bone Density status: Defer to PCP.  Lung Cancer Screening: (Low Dose CT Chest recommended if Age 57-80 years, 30 pack-year currently smoking OR have quit w/in 15years.) does not qualify.   Lung Cancer Screening Referral: Defer to PCP.   Additional Screening:  Hepatitis C Screening: does qualify; Completed 01/25/2020.  Vision Screening: Recommended annual ophthalmology exams for early detection of glaucoma and other disorders of the eye. Is the patient up to date with their annual eye exam?  No  Who is the provider or what is the name of the office in which the patient attends annual eye exams? Defer to PCP.  If pt is not established with a provider, would they like to be referred to a provider to establish care? No .   Dental Screening: Recommended annual dental exams for proper oral hygiene  Community Resource Referral / Chronic Care Management: CRR required this visit?  No   CCM required this visit?  No      Plan:     I have personally reviewed and noted the following in the patient's chart:   Medical and social history Use of alcohol, tobacco or illicit drugs  Current medications and supplements including opioid prescriptions. Patient is not currently taking opioid prescriptions. Functional ability and status Nutritional status Physical activity Advanced directives List of other physicians Hospitalizations, surgeries, and ER visits in previous 12 months Vitals Screenings to include cognitive, depression, and  falls Referrals and appointments  In addition, I have reviewed and discussed with patient certain preventive protocols, quality metrics, and best practice recommendations. A written personalized care plan for preventive services as well as general preventive health recommendations were provided to patient.     Winnifred Dufford, CMA   10/04/2021   Nurse Notes: Face to Face, 15 minutes.   Ms. Abdelrahman , Thank you for taking time to come for your Medicare Wellness Visit. I appreciate your ongoing commitment to your health goals. Please review the following plan we discussed and let me know if I can assist you in the future.   These are the goals we discussed:  Goals   None     This is a list of the screening recommended for you and due dates:  Health Maintenance  Topic Date Due   Stool Blood Test  Never done   Pap Smear  04/13/2019   Lipid (cholesterol) test  11/30/2020   Flu Shot  04/21/2022*   Mammogram  10/04/2022*   Colon Cancer Screening  10/04/2022*   Tetanus Vaccine  10/04/2022*   Zoster (Shingles) Vaccine (1 of 2) 10/04/2022*   Hepatitis C Screening: USPSTF Recommendation to screen - Ages 68-79 yo.  Completed   HIV Screening  Completed   HPV Vaccine  Aged Out  *Topic was postponed. The date shown is not the original due date.

## 2021-10-04 NOTE — Assessment & Plan Note (Signed)
F\u repeat lipid panel -started lipitor 20mg  daily.

## 2021-10-04 NOTE — Assessment & Plan Note (Signed)
Serum creatinine elevated at last visit, will repeat today.

## 2021-10-04 NOTE — Assessment & Plan Note (Signed)
-  provided FIT kit today.

## 2021-10-04 NOTE — Assessment & Plan Note (Addendum)
Endorsing sharp/burning pain in upper extremities without associated spasms. Had been previously prescribed gabapentin 300mg  [facility] but had not been able to pick it up. Previously reported improvement with PT and expressed desire to work with PT/OT again. Weakness mostly in LLE flexion, plantarflexion on exam. -placed referral for PT/OT -gabapentin 300mg  BID PRN for pain. Would start baclofen if Kristen Boyle endorses spasms down the line.  Additionally reporting occasional black spots in her vision. No clear afferent deficits on exam today. Last MRI without clear signs of optic neuritis. -advised her to contact neurology and see whether they would want repeat MRI optic neuritis protocol before next visit. Repeat vision assessment at that visit.  On Ocrevus and has f\u scheduled with neurology later this month.

## 2021-10-04 NOTE — Assessment & Plan Note (Signed)
Rechecking vit D level today.

## 2021-10-04 NOTE — Patient Instructions (Signed)

## 2021-10-05 LAB — LIPID PANEL
Chol/HDL Ratio: 3.3 ratio (ref 0.0–4.4)
Cholesterol, Total: 283 mg/dL — ABNORMAL HIGH (ref 100–199)
HDL: 85 mg/dL (ref >39)
LDL Chol Calc (NIH): 181 mg/dL — ABNORMAL HIGH (ref 0–99)
Triglycerides: 100 mg/dL (ref 0–149)
VLDL Cholesterol Cal: 17 mg/dL (ref 5–40)

## 2021-10-05 LAB — CMP14 + ANION GAP
ALT: 22 IU/L (ref 0–32)
AST: 19 IU/L (ref 0–40)
Albumin/Globulin Ratio: 1.5 (ref 1.2–2.2)
Albumin: 4.7 g/dL (ref 3.9–4.9)
Alkaline Phosphatase: 90 IU/L (ref 44–121)
Anion Gap: 19 mmol/L — ABNORMAL HIGH (ref 10.0–18.0)
BUN/Creatinine Ratio: 16 (ref 12–28)
BUN: 15 mg/dL (ref 8–27)
Bilirubin Total: 0.3 mg/dL (ref 0.0–1.2)
CO2: 21 mmol/L (ref 20–29)
Calcium: 10 mg/dL (ref 8.7–10.3)
Chloride: 103 mmol/L (ref 96–106)
Creatinine, Ser: 0.96 mg/dL (ref 0.57–1.00)
Globulin, Total: 3.1 g/dL (ref 1.5–4.5)
Glucose: 86 mg/dL (ref 70–99)
Potassium: 4.2 mmol/L (ref 3.5–5.2)
Sodium: 143 mmol/L (ref 134–144)
Total Protein: 7.8 g/dL (ref 6.0–8.5)
eGFR: 66 mL/min/{1.73_m2} (ref >59)

## 2021-10-05 LAB — VITAMIN D 25 HYDROXY (VIT D DEFICIENCY, FRACTURES): Vit D, 25-Hydroxy: 48.3 ng/mL (ref 30.0–100.0)

## 2021-10-09 MED ORDER — ATORVASTATIN CALCIUM 20 MG PO TABS: 20.0000 mg | ORAL_TABLET | Freq: Every day | ORAL | 11 refills | Status: DC

## 2021-10-09 NOTE — Addendum Note (Signed)
Addended byLinus Galas on: 10/09/2021 11:39 AM   Modules accepted: Orders

## 2021-10-11 NOTE — Progress Notes (Signed)
[facility] Attending  Case discussed with Dr. Sridharan  at the time of the visit.  We reviewed the resident's history and exam and pertinent patient test results.  I agree with the assessment, diagnosis, and plan of care documented in the resident's note.  

## 2021-10-16 ENCOUNTER — Encounter: Payer: Self-pay | Admitting: Neurology

## 2021-10-16 ENCOUNTER — Ambulatory Visit (INDEPENDENT_AMBULATORY_CARE_PROVIDER_SITE_OTHER): Payer: Medicare Other | Admitting: Neurology

## 2021-10-16 VITALS — BP 122/82 | HR 68 | Ht 60.0 in | Wt 151.6 lb

## 2021-10-16 DIAGNOSIS — Z79899 Other long term (current) drug therapy: Secondary | ICD-10-CM

## 2021-10-16 DIAGNOSIS — R350 Frequency of micturition: Secondary | ICD-10-CM | POA: Diagnosis not present

## 2021-10-16 DIAGNOSIS — G35 Multiple sclerosis: Secondary | ICD-10-CM | POA: Diagnosis not present

## 2021-10-16 DIAGNOSIS — G4700 Insomnia, unspecified: Secondary | ICD-10-CM

## 2021-10-16 MED ORDER — GABAPENTIN 300 MG PO CAPS: 300.0000 mg | ORAL_CAPSULE | Freq: Every day | ORAL | 11 refills | Status: DC

## 2021-10-16 NOTE — Progress Notes (Signed)
[facility]  PATIENT: Kristen Boyle DOB: 12/06/1960  REFERRING DOCTOR OR PCP:  Buddy Duty, DO SOURCE: patient, notes from Dr. Jannifer Franklin  _________________________________   HISTORICAL  CHIEF COMPLAINT:  Chief Complaint  Patient presents with   Follow-up    Pt in room #1 with her daughter. Pt here today for an f/u.    HISTORY OF PRESENT ILLNESS:  She is a 61 y.o. woman with multiple sclerosis diagnosed in 1999.  She is transferring car efrom Dr. Jannifer Franklin who recently retired  Update 10/16/2021: She is on Ocrevus.   She tolerates the IV therapy well.    She started the Ocrevus in 2022, switching from [place].    Her last infusion is August 2023.   She had a spell where she noted black spots in her vision lasting about 30 minutes.   Black spots seemed to be in the entire visual field.  She dod not check monocular vs binocular.     Currently, she is able to walk with a walker and can go 30-50 feet.   She has used a walker x 5 years.  She has left > right leg weakness.  She notes mild hand weakness.   She has left leg stiffness/spasticity as well.   She has numbness in her left hand and both legs.  She did not try the gabapentin.   She has urinary incontinence so wears Depends   She denies  diplopia but  has had notable dysconjugate gaze since 2017 or so.   Her left eye has very poor vision.  Visual symptoms OS started before the MS diagnosis but were mild until more recently.     She has difficulty with sleep maintenance insomnia if she has a slow day but sleeps well when she has a more active day.    She has mild depression and is sometimes irritable.    She has mild cognitive dysfunction.   She notes processing more slowly and trouble coming up with words.    She has reduced focus.  She has left knee pain.  Shoulders hurt now and then.     MS HISTORY: She was diagnosed in 1999 after presenting with a+cute left optic neuritis.Vision improved after steroids.    She had an MRI and lumbar puncture c/w MS.  She was placed on Betaseron.  She did not tolerate it too well and had flu-like issues..   Between 2000 and 2010 she had several exacerbations.  Once she had a drop foot, gait issues slowly progressed.    Vision also progressively worsened.    She went on Gilenya in 2011 but had bumps on her skin and GI issues and stopped.   She went back to Betaseron.  Tysabri had been discussed but she was JCV Ab positive.    In 2022 she was switched to [place].     IMAGING personally reviewed: MRI 8/172022 showed Multiple T2/FLAIR hyperintense foci in the hemispheres, brainstem and cerebellum in a pattern consistent with chronic demyelinating plaque associated with multiple sclerosis.  None of the foci enhance or appear to be acute.  Compared to the MRI from 03/12/2017, there are no new lesions.  MRI of the cervical spine showed Multiple T2/FLAIR hyperintense foci in the brainstem and spinal cord is detailed above consistent with chronic demyelination associated with multiple sclerosis.  None of the foci enhance.  REVIEW OF SYSTEMS: Constitutional: No fevers, chills, sweats, or change in appetite Eyes: No visual changes, double vision, eye pain  Ear, nose and throat: No hearing loss, ear pain, nasal congestion, sore throat Cardiovascular: No chest pain, palpitations Respiratory:  No shortness of breath at rest or with exertion.   No wheezes GastrointestinaI: No nausea, vomiting, diarrhea, abdominal pain, fecal incontinence Genitourinary:  No dysuria, urinary retention or frequency.  No nocturia. Musculoskeletal:  shoulder and knee pain.   Integumentary: No rash, pruritus, skin lesions Neurological: as above Psychiatric: No depression at this time.  No anxiety Endocrine: No palpitations, diaphoresis, change in appetite, change in weigh or increased thirst Hematologic/Lymphatic:  No anemia, purpura, petechiae. Allergic/Immunologic: No itchy/runny eyes, nasal  congestion, recent allergic reactions, rashes  ALLERGIES: Allergies  Allergen Reactions   Gilenya [Fingolimod]     Stomach upset    HOME MEDICATIONS:  Current Outpatient Medications:    ibuprofen (ADVIL,MOTRIN) 200 MG tablet, Take 400 mg by mouth every 6 (six) hours as needed (For pain.)., Disp: , Rfl:    ocrelizumab (OCREVUS) 300 MG/10ML injection, Inject into the vein., Disp: , Rfl:    gabapentin (NEURONTIN) 300 MG capsule, Take 1 capsule (300 mg total) by mouth at bedtime., Disp: 30 capsule, Rfl: 11  PAST MEDICAL HISTORY: Past Medical History:  Diagnosis Date   Abnormality of gait 06/10/2012   Depression    Headache    Hyperlipidemia    MS (multiple sclerosis) ([place])     PAST SURGICAL HISTORY: Past Surgical History:  Procedure Laterality Date   CHOLECYSTECTOMY N/A 06/20/2015   Procedure: LAPAROSCOPIC CHOLECYSTECTOMY;  Surgeon: Ralene Ok, MD;  Location: [facility];  Service: General;  Laterality: N/A;   DENTAL SURGERY     TUBAL LIGATION Bilateral     FAMILY HISTORY: Family History  Problem Relation Age of Onset   Alcoholism Father    Diabetes Paternal Aunt    Dementia Paternal Grandmother        Senile dementia of Alzheimer's type   Colon cancer Cousin    Colon cancer Maternal Aunt     SOCIAL HISTORY:  Social History   Socioeconomic History   Marital status: Divorced    Spouse name: Not on file   Number of children: 4   Years of education: 2-College   Highest education level: Not on file  Occupational History    Comment: Disabled  Tobacco Use   Smoking status: Never   Smokeless tobacco: Never  Vaping Use   Vaping Use: Never used  Substance and Sexual Activity   Alcohol use: No    Alcohol/week: 0.0 standard drinks of alcohol   Drug use: No   Sexual activity: Not Currently  Other Topics Concern   Not on file  Social History Narrative   Patient lives at home with her daughter.   Disabled.   Education two years of college.   Right handed.    Social Determinants of Health   Financial Resource Strain: Low Risk  (10/04/2021)   Overall Financial Resource Strain (CARDIA)    Difficulty of Paying Living Expenses: Not hard at all  Food Insecurity: No Food Insecurity (10/04/2021)   Hunger Vital Sign    Worried About Running Out of Food in the Last Year: Never true    Ran Out of Food in the Last Year: Never true  Transportation Needs: No Transportation Needs (10/04/2021)   PRAPARE - Hydrologist (Medical): No    Lack of Transportation (Non-Medical): No  Physical Activity: Insufficiently Active (10/04/2021)   Exercise Vital Sign    Days of Exercise per Week: 2 days  Minutes of Exercise per Session: 20 min  Stress: No Stress Concern Present (10/04/2021)   Stem    Feeling of Stress : Only a little  Social Connections: Socially Isolated (10/04/2021)   Social Connection and Isolation Panel [NHANES]    Frequency of Communication with Friends and Family: More than three times a week    Frequency of Social Gatherings with Friends and Family: More than three times a week    Attends Religious Services: Never    Marine scientist or Organizations: No    Attends Archivist Meetings: Never    Marital Status: Divorced  Human resources officer Violence: Not At Risk (10/04/2021)   Humiliation, Afraid, Rape, and Kick questionnaire    Fear of Current or Ex-Partner: No    Emotionally Abused: No    Physically Abused: No    Sexually Abused: No     PHYSICAL EXAM  Vitals:   10/16/21 1004  BP: 122/82  Pulse: 68  Weight: 151 lb 9.6 oz (68.8 kg)  Height: 5' (1.524 m)    Body mass index is 29.61 kg/m.   General: The patient is well-developed and well-nourished and in no acute distress  HEENT:  Head is [place]/AT.  Sclera are anicteric.   Skin: Extremities are without rash or  edema.  Musculoskeletal:  Back is nontender.  Mild  shoulder tenderness.    Neurologic Exam  Mental status: The patient is alert and oriented x 3 at the time of the examination. The patient has apparent normal recent and remote memory, with an apparently normal attention span and concentration ability.   Speech is normal.  Cranial nerves: Extraocular movements are reduced OS and she has incomplete INO and exophoria.  Pupils show left APD.  She has reduced vision bilaterally, severe on the left which she is only able to count fingers.  She can read large print on the right   Reduce colors OS.  Facial symmetry is present. There is good facial sensation to soft touch bilaterally.Facial strength is normal.  Trapezius and sternocleidomastoid strength is normal. No dysarthria is noted.  The tongue is midline, and the patient has symmetric elevation of the soft palate. No obvious hearing deficits are noted.  Motor:  Muscle bulk is normal.   Tone is normal. Strength is  4+-5 / 5 in left arm, 4+ to 5/5 right arm, 2/5 left leg x 3/5 quads ad hamstrings and 4- to 4 right leg.    decreased RAM in  hands  Sensory: Sensory testing is intact to pinprick, soft touch and vibration sensation in arms but reduced vibration in left leg.    Coordination: Cerebellar testing reveals reduced  finger-nose-finger and poor right HTS and unable to do left heel-to-shin .  Gait and station: She needs to use arms to stand up.    Requires bilat support to stand Gait requires bilateral support.Cannot tandem     Reflexes: Deep tendon reflexes are symmetric and normal in arms but increased in legs, left > right with spread at knees.  No ankle clonus.       DIAGNOSTIC DATA (LABS, IMAGING, TESTING) - I reviewed patient records, labs, notes, testing and imaging myself where available.  Lab Results  Component Value Date   WBC 3.9 04/11/2021   HGB 13.5 04/11/2021   HCT 42.6 04/11/2021   MCV 79 04/11/2021   PLT 265 04/11/2021      Component Value Date/Time   NA 143  10/04/2021  1204   K 4.2 10/04/2021 1204   CL 103 10/04/2021 1204   CO2 21 10/04/2021 1204   GLUCOSE 86 10/04/2021 1204   GLUCOSE 93 04/16/2015 2254   BUN 15 10/04/2021 1204   CREATININE 0.96 10/04/2021 1204   CREATININE 0.89 09/09/2013 1115   CALCIUM 10.0 10/04/2021 1204   PROT 7.8 10/04/2021 1204   ALBUMIN 4.7 10/04/2021 1204   AST 19 10/04/2021 1204   ALT 22 10/04/2021 1204   ALKPHOS 90 10/04/2021 1204   BILITOT 0.3 10/04/2021 1204   GFRNONAA 84 12/01/2019 1625   GFRNONAA 73 09/09/2013 1115   GFRAA 96 12/01/2019 1625   GFRAA 84 09/09/2013 1115   Lab Results  Component Value Date   CHOL 283 (H) 10/04/2021   HDL 85 10/04/2021   LDLCALC 181 (H) 10/04/2021   TRIG 100 10/04/2021   CHOLHDL 3.3 10/04/2021   Lab Results  Component Value Date   HGBA1C 5.4 09/17/2010   No results found for: "VITAMINB12" Lab Results  Component Value Date   TSH 2.410 11/01/2015       ASSESSMENT AND PLAN  Multiple sclerosis ([place]) - Plan: gabapentin (NEURONTIN) 300 MG capsule  High risk medication use  Urinary frequency  Insomnia, unspecified type    Continue Ocrevus.  We will check labs.   2.    Gabapentin 300 mg nightly for insomnia, RLS, dysesthesia 3.    Stay active and exercise if possible. 4.    She has a script for Lipitor but has not taken - Tchol=283 and LDL=181 and recommended that she start as diet alone may not be enough to help.  Advised her to go ahead and start.  RTC 6 months or sooner if new or worsening neurologic issues.       Junious Ragone A. Felecia Shelling, MD, [facility] [id number], [id number] PM Certified in Neurology, Clinical Neurophysiology, Sleep Medicine and Neuroimaging  [facility] [address], [place], [place] [address] [phone]

## 2021-10-18 NOTE — Progress Notes (Signed)
Internal Medicine Clinic Attending  Case and documentation reviewed.  I reviewed the AWV findings.  I agree with the assessment, diagnosis, and plan of care documented in the AWV note.     

## 2022-01-24 ENCOUNTER — Telehealth: Payer: Self-pay | Admitting: *Deleted

## 2022-01-24 DIAGNOSIS — G35 Multiple sclerosis: Secondary | ICD-10-CM

## 2022-01-24 MED ORDER — OCREVUS 300 MG/10ML IV SOLN: 600.0000 mg | INTRAVENOUS | 1 refills | Status: DC

## 2022-01-24 NOTE — Telephone Encounter (Signed)
Patient follow up scheduled on 04/30/22. Per note Continue Ocrevus. Rx sent.

## 2022-02-26 DIAGNOSIS — G35 Multiple sclerosis: Secondary | ICD-10-CM | POA: Diagnosis not present

## 2022-02-27 ENCOUNTER — Encounter: Payer: Medicare Other | Admitting: Student

## 2022-03-13 ENCOUNTER — Encounter: Payer: Self-pay | Admitting: Student

## 2022-03-13 ENCOUNTER — Ambulatory Visit (INDEPENDENT_AMBULATORY_CARE_PROVIDER_SITE_OTHER): Payer: Medicare Other | Admitting: Student

## 2022-03-13 VITALS — BP 137/86 | HR 67 | Temp 97.6°F | Ht 60.0 in | Wt 153.0 lb

## 2022-03-13 DIAGNOSIS — M1712 Unilateral primary osteoarthritis, left knee: Secondary | ICD-10-CM

## 2022-03-13 DIAGNOSIS — K5900 Constipation, unspecified: Secondary | ICD-10-CM | POA: Diagnosis not present

## 2022-03-13 DIAGNOSIS — Z0000 Encounter for general adult medical examination without abnormal findings: Secondary | ICD-10-CM

## 2022-03-13 DIAGNOSIS — E785 Hyperlipidemia, unspecified: Secondary | ICD-10-CM | POA: Diagnosis not present

## 2022-03-13 DIAGNOSIS — G35 Multiple sclerosis: Secondary | ICD-10-CM | POA: Diagnosis not present

## 2022-03-13 MED ORDER — POLYETHYLENE GLYCOL 3350 17 G PO PACK: 17.0000 g | PACK | Freq: Every day | ORAL | 2 refills | Status: AC

## 2022-03-13 NOTE — Assessment & Plan Note (Signed)
-  Patient is due for Tdap but would like to postpone given recent infusion of Ocrevus -She is due for Pap smear.  Last done 2018 with cotesting.  Patient would like to have a female provider.  She understands that we do not have many female providers in the clinic.  Will try to schedule her with a female provider .

## 2022-03-13 NOTE — Progress Notes (Signed)
CC:   HPI:  Kristen Boyle is a 62 y.o. living with multiple sclerosis, depression, who is here for evaluation of left knee pain.   Please see problem based charting for detail  Past Medical History:  Diagnosis Date   Abnormality of gait 06/10/2012   Depression    Headache    Hyperlipidemia    MS (multiple sclerosis) ([place])    Review of Systems:  per HPI  Physical Exam:  Vitals:   03/13/22 0946  BP: 137/86  Pulse: 67  Temp: 97.6 F (36.4 C)  TempSrc: Oral  SpO2: 100%  Weight: 153 lb (69.4 kg)  Height: 5' (1.524 m)   Physical Exam Constitutional:      General: She is not in acute distress.    Appearance: She is not ill-appearing.  Eyes:     General:        Right eye: No discharge.        Left eye: No discharge.     Conjunctiva/sclera: Conjunctivae normal.  Cardiovascular:     Rate and Rhythm: Normal rate and regular rhythm.  Pulmonary:     Effort: Pulmonary effort is normal. No respiratory distress.     Breath sounds: Normal breath sounds. No wheezing.  Abdominal:     General: Bowel sounds are normal. There is no distension.     Palpations: Abdomen is soft.     Tenderness: There is no abdominal tenderness.  Musculoskeletal:     Comments: No effusion palpated left knee pain.  Tenderness to palpation anterior knee.  Normal passive range of motion.  No lower extremity edema.  Neurological:     Mental Status: She is alert. Mental status is at baseline.      Assessment & Plan:   See Encounters Tab for problem based charting.  Osteoarthritis of left knee Patient is here for evaluation of her left knee pain.  This is a chronic issue for the last year.  Patient has left dropfoot from [place] which she has been dragging her left foot with ambulation.  This caused her knee to be painful and now is limiting her ambulation.  She endorses pain only with walking which she does not do much now.  She uses a walker to get to the bathroom only.  She also endorses  locking sensation of her knee.  Patient was working with PT in the past and was recommended a foot brace but it did not fit.  Physical exam was unremarkable.  There was no effusion palpated of the left knee.  There was mild tenderness to palpation anteriorly with normal passive range of motion.  No lower extremity edema.  This is likely osteoarthritis due to malposition from foot drop.  No red flag symptoms.  -Referral to PT.  She will need to have another brace evaluated by PT. -Advised patient to take Tylenol or ibuprofen as needed for pain. -If pain does not improve, will consider left knee x-ray or steroid injection.  Multiple sclerosis ([place]) Patient is following with Dr. Felecia Shelling for MS.  She currently getting Ocrevus infusion every 6 months.  Reports stable MS symptoms.  Hyperlipidemia She reports adherence to Lipitor 20 mg daily.  Last LDL was 181.  -Recheck lipid panel  Constipation Daughter reports a small tear of her anus that has healed.  Patient reports constipation with reduced bowel frequency.  She reportedly is drinking plenty of fluids and eating enough fibers.  With MS, she could have neurogenic constipation.  -Start low-dose MiraLAX  17 g daily  Healthcare maintenance -Patient is due for Tdap but would like to postpone given recent infusion of Ocrevus -She is due for Pap smear.  Last done 2018 with cotesting.  Patient would like to have a female provider.  She understands that we do not have many female providers in the clinic.  Will try to schedule her with a female provider .   Patient discussed with Dr. Heber Lockington

## 2022-03-13 NOTE — Assessment & Plan Note (Signed)
Patient is following with Dr. Felecia Shelling for MS.  She currently getting Ocrevus infusion every 6 months.  Reports stable MS symptoms.

## 2022-03-13 NOTE — Patient Instructions (Addendum)
Kristen Boyle,  It was nice seeing you in clinic today.  Here is a summary what we talked about  1.  Left knee pain: This is likely osteoarthritis from wear-and-tear.  Please take Tylenol (500 mg 4 times a day as needed) or Ibuprofen for your pain.  I placed a referral to physical therapy to work on strengthening your knee.  If pain does not improve, we will discuss getting an x-ray and steroid injection to your knee.  2.  I will recheck your cholesterol today  3.  I prescribed MiraLAX for constipation.  Please take 1 dose a day.  He can cut back if you have many bowel movements.  4.  Please follow-up with Dr. Felecia Shelling for your MS.  Return in 3 months, sooner as needed  Dr. Alfonse Spruce

## 2022-03-13 NOTE — Assessment & Plan Note (Signed)
Daughter reports a small tear of her anus that has healed.  Patient reports constipation with reduced bowel frequency.  She reportedly is drinking plenty of fluids and eating enough fibers.  With MS, she could have neurogenic constipation.  -Start low-dose MiraLAX 17 g daily

## 2022-03-13 NOTE — Assessment & Plan Note (Signed)
She reports adherence to Lipitor 20 mg daily.  Last LDL was 181.  -Recheck lipid panel

## 2022-03-13 NOTE — Assessment & Plan Note (Signed)
Patient is here for evaluation of her left knee pain.  This is a chronic issue for the last year.  Patient has left dropfoot from [place] which she has been dragging her left foot with ambulation.  This caused her knee to be painful and now is limiting her ambulation.  She endorses pain only with walking which she does not do much now.  She uses a walker to get to the bathroom only.  She also endorses locking sensation of her knee.  Patient was working with PT in the past and was recommended a foot brace but it did not fit.  Physical exam was unremarkable.  There was no effusion palpated of the left knee.  There was mild tenderness to palpation anteriorly with normal passive range of motion.  No lower extremity edema.  This is likely osteoarthritis due to malposition from foot drop.  No red flag symptoms.  -Referral to PT.  She will need to have another brace evaluated by PT. -Advised patient to take Tylenol or ibuprofen as needed for pain. -If pain does not improve, will consider left knee x-ray or steroid injection.

## 2022-03-14 LAB — LIPID PANEL
Chol/HDL Ratio: 2.1 ratio (ref 0.0–4.4)
Cholesterol, Total: 164 mg/dL (ref 100–199)
HDL: 79 mg/dL (ref >39)
LDL Chol Calc (NIH): 72 mg/dL (ref 0–99)
Triglycerides: 65 mg/dL (ref 0–149)
VLDL Cholesterol Cal: 13 mg/dL (ref 5–40)

## 2022-03-15 NOTE — Progress Notes (Signed)
Internal Medicine Clinic Attending  Case discussed with the resident at the time of the visit.  We reviewed the resident's history and exam and pertinent patient test results.  I agree with the assessment, diagnosis, and plan of care documented in the resident's note.  

## 2022-03-18 ENCOUNTER — Encounter: Payer: Self-pay | Admitting: Student

## 2022-04-09 ENCOUNTER — Other Ambulatory Visit: Payer: Self-pay | Admitting: Internal Medicine

## 2022-04-09 ENCOUNTER — Telehealth: Payer: Self-pay | Admitting: *Deleted

## 2022-04-09 DIAGNOSIS — Z1231 Encounter for screening mammogram for malignant neoplasm of breast: Secondary | ICD-10-CM

## 2022-04-30 ENCOUNTER — Ambulatory Visit (INDEPENDENT_AMBULATORY_CARE_PROVIDER_SITE_OTHER): Payer: Medicare Other | Admitting: Adult Health

## 2022-04-30 ENCOUNTER — Telehealth: Payer: Self-pay | Admitting: Adult Health

## 2022-04-30 ENCOUNTER — Encounter: Payer: Self-pay | Admitting: Adult Health

## 2022-04-30 VITALS — BP 115/75 | HR 61 | Ht 60.0 in | Wt 146.0 lb

## 2022-04-30 DIAGNOSIS — Z5181 Encounter for therapeutic drug level monitoring: Secondary | ICD-10-CM

## 2022-04-30 DIAGNOSIS — E785 Hyperlipidemia, unspecified: Secondary | ICD-10-CM

## 2022-04-30 DIAGNOSIS — G35 Multiple sclerosis: Secondary | ICD-10-CM | POA: Diagnosis not present

## 2022-04-30 NOTE — Telephone Encounter (Signed)
medicare/medicaid NPR sent to GI [phone] 

## 2022-04-30 NOTE — Progress Notes (Signed)
PATIENT: Kristen Boyle DOB: 07/30/1960  REASON FOR VISIT: follow up HISTORY FROM: patient PRIMARY NEUROLOGIST:   HISTORY OF PRESENT ILLNESS: Today 04/30/22:  Kristen Boyle is a 62 y.o. female with a history of multiple sclerosis. Returns today for follow-up.  She returns today for follow-up with her daughter. Last infusion was in March 2024 At the last office visit she was given gabapentin by Dr. Epimenio Foot.  She reports that she does not use it.  Reports that she took it twice. Reports that it made her too drowsy the first time she took it. The second time was better but she doesn't feel like she needs it.   Reports constipation with bowels- no matter what she eats. Was prescribed Miralax (PCP) but has not picked it up yet. Reports Urinary urgency- wears depends.   Still reporting numbness in left hand and feet- no changes.  Uses a wheelchair primarily.  Daughter feels that when she was doing physical therapy she was more mobile.  She is reporting that she is having trouble lifting her left arm all the way up.  She thinks that she may have strained or injured a muscle in her shoulder.  Her daughter states that she uses that arm to bear weight when standing up.  She has not discussed with her PCP.  Update 10/16/2021: She is on Ocrevus.   She tolerates the IV therapy well.    She started the Ocrevus in 2022, switching from Kristen Boyle.    Her last infusion is August 2023.    She had a spell where she noted black spots in her vision lasting about 30 minutes.   Black spots seemed to be in the entire visual field.  She dod not check monocular vs binocular.  Has trouble with her vision. Daughter reports that recently she went to hand her something and her mom reached past it. Uses the wheelchair primarily. Daughter eports she has not been has mobile since she stopped PT. Would lke to restart.   Currently, she is able to walk with a walker and can go 30-50 feet.   She has used a walker x 5  years.  She has left > right leg weakness.  She notes mild hand weakness.   She has left leg stiffness/spasticity as well.   She has numbness in her left hand and both legs.  She did not try the gabapentin.   She has urinary incontinence so wears Depends   She denies  diplopia but  has had notable dysconjugate gaze since 2017 or so.   Her left eye has very poor vision.  Visual symptoms OS started before the MS diagnosis but were mild until more recently.      She has difficulty with sleep maintenance insomnia if she has a slow day but sleeps well when she has a more active day.    She has mild depression and is sometimes irritable.    She has mild cognitive dysfunction.   She notes processing more slowly and trouble coming up with words.    She has reduced focus.   She has left knee pain.  Shoulders hurt now and then.       MS HISTORY: She was diagnosed in 1999 after presenting with a+cute left optic neuritis.Vision improved after steroids.   She had an MRI and lumbar puncture c/w MS.  She was placed on Betaseron.  She did not tolerate it too well and had flu-like issues..   Between 2000  and 2010 she had several exacerbations.  Once she had a drop foot, gait issues slowly progressed.    Vision also progressively worsened.    She went on Gilenya in 2011 but had bumps on her skin and GI issues and stopped.   She went back to Betaseron.  Tysabri had been discussed but she was JCV Ab positive.    In 2022 she was switched to Ocrevus.       IMAGING personally reviewed: MRI 8/172022 showed Multiple T2/FLAIR hyperintense foci in the hemispheres, brainstem and cerebellum in a pattern consistent with chronic demyelinating plaque associated with multiple sclerosis.  None of the foci enhance or appear to be acute.  Compared to the MRI from 03/12/2017, there are no new lesions.   MRI of the cervical spine showed Multiple T2/FLAIR hyperintense foci in the brainstem and spinal cord is detailed above consistent with  chronic demyelination associated with multiple sclerosis.  None of the foci enhance. 09/11/20: Kristen Boyle is a 62 year old female with a history of multiple sclerosis associated with bilateral optic neuritis and left hemiparesis with a gait disorder.  She returns today for follow-up.  She was started on Ocrevus.  She states after the infusion she noticed significant changes in her gait or balance.  She needed assistance with ambulation.  Her daughter reports that her balance has improved but she is not back to her baseline before her infusion.  She reports some bladder incontinence but reports that most the time is due to her mobility and not being able to make it to the bathroom in time.  She reports some depression but denies needing medication at this time.  Has been followed by ophthalmology.  Reports blurry vision and needing larger font in order to read.  At home she is able to complete all ADLs independently but at a slower pace.  She returns today for an evaluation.  HISTORY (copied from Dr. Clarisa Kindred note) Kristen Boyle is a 62 year old right-handed black female with history of multiple sclerosis associated with bilateral optic neuritis and a left hemiparesis with a gait disorder. The patient has been lost to follow-up, she was last seen here about 2-1/2 years ago. She was on Betaseron at that time. She has an AFO brace for the left foot but she has not been using it as it is uncomfortable. The patient over time has had a gradual slow change in her functional ability with increased problems with left-sided weakness and a decline in her ability to ambulate. The patient uses a walker with a seat to get around, she did have a fall several days ago when she got up out of bed and got dizzy and may have blacked out. Generally she does not fall while using the walker. She reports no difficulty controlling the bowels or the bladder. She does have significant visual impairment that she believes has slowly changed  over time. She denies any double vision. She reports that both legs have gotten weaker and the left arm has gotten weaker. She has also noted some cognitive changes. Her primary care physician has set her up for some physical therapy which has not yet started. She comes to this office for further evaluation. She has not been on any disease modifying agents. In the past, she has been positive for the JC viral antibody.  REVIEW OF SYSTEMS: Out of a complete 14 system review of symptoms, the patient complains only of the following symptoms, and all other reviewed systems are negative.  See  HPI  ALLERGIES: Allergies  Allergen Reactions   Gilenya [Fingolimod]     Stomach upset    HOME MEDICATIONS: Outpatient Medications Prior to Visit  Medication Sig Dispense Refill   atorvastatin (LIPITOR) 20 MG tablet Take 20 mg by mouth daily.     gabapentin (NEURONTIN) 300 MG capsule Take 1 capsule (300 mg total) by mouth at bedtime. 30 capsule 11   ibuprofen (ADVIL,MOTRIN) 200 MG tablet Take 400 mg by mouth every 6 (six) hours as needed (For pain.).     ocrelizumab (OCREVUS) 300 MG/10ML injection Inject 20 mLs (600 mg total) into the vein every 6 (six) months. 20 mL 1   polyethylene glycol (MIRALAX) 17 g packet Take 17 g by mouth daily. 30 packet 2   No facility-administered medications prior to visit.    PAST MEDICAL HISTORY: Past Medical History:  Diagnosis Date   Abnormality of gait 06/10/2012   Depression    Headache    Hyperlipidemia    MS (multiple sclerosis) (HCC)     PAST SURGICAL HISTORY: Past Surgical History:  Procedure Laterality Date   CHOLECYSTECTOMY N/A 06/20/2015   Procedure: LAPAROSCOPIC CHOLECYSTECTOMY;  Surgeon: Axel Filler, MD;  Location: [facility];  Service: General;  Laterality: N/A;   DENTAL SURGERY     TUBAL LIGATION Bilateral     FAMILY HISTORY: Family History  Problem Relation Age of Onset   Alcoholism Father    Diabetes Paternal Aunt    Dementia Paternal  Grandmother        Senile dementia of Alzheimer's type   Colon cancer Cousin    Colon cancer Maternal Aunt     SOCIAL HISTORY: Social History   Socioeconomic History   Marital status: Divorced    Spouse name: Not on file   Number of children: 4   Years of education: 2-College   Highest education level: Not on file  Occupational History    Comment: Disabled  Tobacco Use   Smoking status: Never   Smokeless tobacco: Never  Vaping Use   Vaping Use: Never used  Substance and Sexual Activity   Alcohol use: No    Alcohol/week: 0.0 standard drinks of alcohol   Drug use: No   Sexual activity: Not Currently  Other Topics Concern   Not on file  Social History Narrative   Patient lives at home with her daughter.   Disabled.   Education two years of college.   Right handed.   Social Determinants of Health   Financial Resource Strain: Low Risk  (10/04/2021)   Overall Financial Resource Strain (CARDIA)    Difficulty of Paying Living Expenses: Not hard at all  Food Insecurity: No Food Insecurity (10/04/2021)   Hunger Vital Sign    Worried About Running Out of Food in the Last Year: Never true    Ran Out of Food in the Last Year: Never true  Transportation Needs: No Transportation Needs (10/04/2021)   PRAPARE - Administrator, Civil Service (Medical): No    Lack of Transportation (Non-Medical): No  Physical Activity: Insufficiently Active (10/04/2021)   Exercise Vital Sign    Days of Exercise per Week: 2 days    Minutes of Exercise per Session: 20 min  Stress: No Stress Concern Present (10/04/2021)   [employer] of Occupational Health - Occupational Stress Questionnaire    Feeling of Stress : Only a little  Social Connections: Socially Isolated (10/04/2021)   Social Connection and Isolation Panel [NHANES]    Frequency of Communication  with Friends and Family: More than three times a week    Frequency of Social Gatherings with Friends and Family: More than three  times a week    Attends Religious Services: Never    Database administrator or Organizations: No    Attends Banker Meetings: Never    Marital Status: Divorced  Catering manager Violence: Not At Risk (10/04/2021)   Humiliation, Afraid, Rape, and Kick questionnaire    Fear of Current or Ex-Partner: No    Emotionally Abused: No    Physically Abused: No    Sexually Abused: No      PHYSICAL EXAM  Vitals:   04/30/22 1128  BP: 115/75  Pulse: 61  Weight: 146 lb (66.2 kg)  Height: 5' (1.524 m)    Body mass index is 28.51 kg/m.  Generalized: Well developed, in no acute distress   Neurological examination  Mentation: Alert oriented to time, place, history taking. Follows all commands speech and language fluent Cranial nerve II-XII: . Extraocular movements were full, visual field were full on confrontational test. Facial sensation and strength were normal. Uvula tongue midline. Head turning and shoulder shrug  were normal and symmetric. Motor: The motor testing reveals 5 over 5 strength on the right side. 4/5 in the left arm. 2/5 strength in the RLE and 1/5 in the left lower extremity.  Sensory: Sensory testing is intact to soft touch on all 4 extremities. No evidence of extinction is noted.  Coordination: Cerebellar testing reveals good finger-nose-finger laterally.  Unable to perform heel-to-shin Gait and station: In a wheelchair   DIAGNOSTIC DATA (LABS, IMAGING, TESTING) - I reviewed patient records, labs, notes, testing and imaging myself where available.  Lab Results  Component Value Date   WBC 3.9 04/11/2021   HGB 13.5 04/11/2021   HCT 42.6 04/11/2021   MCV 79 04/11/2021   PLT 265 04/11/2021      Component Value Date/Time   NA 143 10/04/2021 1204   K 4.2 10/04/2021 1204   CL 103 10/04/2021 1204   CO2 21 10/04/2021 1204   GLUCOSE 86 10/04/2021 1204   GLUCOSE 93 04/16/2015 2254   BUN 15 10/04/2021 1204   CREATININE 0.96 10/04/2021 1204   CREATININE  0.89 09/09/2013 1115   CALCIUM 10.0 10/04/2021 1204   PROT 7.8 10/04/2021 1204   ALBUMIN 4.7 10/04/2021 1204   AST 19 10/04/2021 1204   ALT 22 10/04/2021 1204   ALKPHOS 90 10/04/2021 1204   BILITOT 0.3 10/04/2021 1204   GFRNONAA 84 12/01/2019 1625   GFRNONAA 73 09/09/2013 1115   GFRAA 96 12/01/2019 1625   GFRAA 84 09/09/2013 1115   Lab Results  Component Value Date   CHOL 164 03/13/2022   HDL 79 03/13/2022   LDLCALC 72 03/13/2022   TRIG 65 03/13/2022   CHOLHDL 2.1 03/13/2022   Lab Results  Component Value Date   HGBA1C 5.4 09/17/2010   No results found for: "VITAMINB12" Lab Results  Component Value Date   TSH 2.410 11/01/2015      ASSESSMENT AND PLAN 62 y.o. year old female  has a past medical history of Abnormality of gait (06/10/2012), Depression, Headache, Hyperlipidemia, and MS (multiple sclerosis) (HCC). here with:  Multiple sclerosis-secondary progressive Hyperlipidemia  -Continue Ocrevus -Blood work today -Referral placed for physical therapy -Repeat MRI of the brain and cervical spine with and without contrast to look for progression -Patient asked if I can check her cholesterol levels as she is now back on Lipitor  Follow-up in 6-7 months or sooner if needed     Butch PennyMegan Krishawn Vanderweele, MSN, NP-C 04/30/2022, 11:18 AM [facility] [address], [address] [place], [place] [address] [phone]

## 2022-04-30 NOTE — Patient Instructions (Signed)
Your Plan:  Continue Ocrevus Continue gabapentin MRI of the brain and cervical spine to look for progression of MS Blood work today   Thank you for coming to see [place] at [facility]. I hope we have been able to provide you high quality care today.  You may receive a patient satisfaction survey over the next few weeks. We would appreciate your feedback and comments so that we may continue to improve ourselves and the health of our patients.

## 2022-04-30 NOTE — Telephone Encounter (Signed)
Referral sent to [facility] in [place], phone # [phone]. 

## 2022-05-01 LAB — CBC WITH DIFFERENTIAL/PLATELET
Basophils Absolute: 0 10*3/uL (ref 0.0–0.2)
Basos: 1 %
EOS (ABSOLUTE): 0.1 10*3/uL (ref 0.0–0.4)
Eos: 2 %
Hematocrit: 43 % (ref 34.0–46.6)
Hemoglobin: 13.1 g/dL (ref 11.1–15.9)
Immature Grans (Abs): 0 10*3/uL (ref 0.0–0.1)
Immature Granulocytes: 0 %
Lymphocytes Absolute: 2.5 10*3/uL (ref 0.7–3.1)
Lymphs: 62 %
MCH: 23.6 pg — ABNORMAL LOW (ref 26.6–33.0)
MCHC: 30.5 g/dL — ABNORMAL LOW (ref 31.5–35.7)
MCV: 78 fL — ABNORMAL LOW (ref 79–97)
Monocytes Absolute: 0.3 10*3/uL (ref 0.1–0.9)
Monocytes: 8 %
Neutrophils Absolute: 1.1 10*3/uL — ABNORMAL LOW (ref 1.4–7.0)
Neutrophils: 27 %
Platelets: 301 10*3/uL (ref 150–450)
RBC: 5.54 x10E6/uL — ABNORMAL HIGH (ref 3.77–5.28)
RDW: 14.8 % (ref 11.7–15.4)
WBC: 4 10*3/uL (ref 3.4–10.8)

## 2022-05-01 LAB — COMPREHENSIVE METABOLIC PANEL
ALT: 49 IU/L — ABNORMAL HIGH (ref 0–32)
AST: 32 IU/L (ref 0–40)
Albumin/Globulin Ratio: 1.4 (ref 1.2–2.2)
Albumin: 4.6 g/dL (ref 3.9–4.9)
Alkaline Phosphatase: 131 IU/L — ABNORMAL HIGH (ref 44–121)
BUN/Creatinine Ratio: 15 (ref 12–28)
BUN: 16 mg/dL (ref 8–27)
Bilirubin Total: 0.4 mg/dL (ref 0.0–1.2)
CO2: 22 mmol/L (ref 20–29)
Calcium: 9.9 mg/dL (ref 8.7–10.3)
Chloride: 103 mmol/L (ref 96–106)
Creatinine, Ser: 1.06 mg/dL — ABNORMAL HIGH (ref 0.57–1.00)
Globulin, Total: 3.3 g/dL (ref 1.5–4.5)
Glucose: 89 mg/dL (ref 70–99)
Potassium: 4.2 mmol/L (ref 3.5–5.2)
Sodium: 140 mmol/L (ref 134–144)
Total Protein: 7.9 g/dL (ref 6.0–8.5)
eGFR: 59 mL/min/{1.73_m2} — ABNORMAL LOW (ref >59)

## 2022-05-01 LAB — LIPID PANEL
Chol/HDL Ratio: 2 ratio (ref 0.0–4.4)
Cholesterol, Total: 163 mg/dL (ref 100–199)
HDL: 82 mg/dL (ref >39)
LDL Chol Calc (NIH): 68 mg/dL (ref 0–99)
Triglycerides: 67 mg/dL (ref 0–149)
VLDL Cholesterol Cal: 13 mg/dL (ref 5–40)

## 2022-05-01 LAB — IGG, IGA, IGM
IgA/Immunoglobulin A, Serum: 346 mg/dL (ref 87–352)
IgG (Immunoglobin G), Serum: 1262 mg/dL (ref 586–1602)
IgM (Immunoglobulin M), Srm: 71 mg/dL (ref 26–217)

## 2022-05-10 DIAGNOSIS — M79602 Pain in left arm: Secondary | ICD-10-CM | POA: Diagnosis not present

## 2022-05-10 DIAGNOSIS — M6281 Muscle weakness (generalized): Secondary | ICD-10-CM | POA: Diagnosis not present

## 2022-05-10 DIAGNOSIS — R262 Difficulty in walking, not elsewhere classified: Secondary | ICD-10-CM | POA: Diagnosis not present

## 2022-06-03 ENCOUNTER — Other Ambulatory Visit: Payer: Self-pay

## 2022-06-03 MED ORDER — ATORVASTATIN CALCIUM 20 MG PO TABS: 20.0000 mg | ORAL_TABLET | Freq: Every day | ORAL | 3 refills | Status: AC

## 2022-08-29 DIAGNOSIS — G35 Multiple sclerosis: Secondary | ICD-10-CM | POA: Diagnosis not present

## 2022-10-29 ENCOUNTER — Telehealth: Payer: Self-pay | Admitting: Adult Health

## 2022-10-29 NOTE — Telephone Encounter (Signed)
LVM and sent mychart msg informing pt of need to reschedule 12/03/22 appt - NP out

## 2022-12-03 ENCOUNTER — Ambulatory Visit: Payer: Medicare Other | Admitting: Adult Health

## 2023-01-23 ENCOUNTER — Other Ambulatory Visit: Payer: Self-pay | Admitting: Neurology

## 2023-01-23 DIAGNOSIS — G35 Multiple sclerosis: Secondary | ICD-10-CM

## 2023-01-23 NOTE — Telephone Encounter (Signed)
 Last seen on 04/30/22 No follow up scheduled

## 2023-01-27 ENCOUNTER — Other Ambulatory Visit: Payer: Self-pay | Admitting: Neurology

## 2023-01-27 DIAGNOSIS — G35 Multiple sclerosis: Secondary | ICD-10-CM

## 2023-01-28 NOTE — Telephone Encounter (Signed)
 Last seen on 04/30/22 No follow up scheduled

## 2023-03-03 DIAGNOSIS — G35 Multiple sclerosis: Secondary | ICD-10-CM | POA: Diagnosis not present

## 2023-04-17 ENCOUNTER — Ambulatory Visit: Admitting: Student

## 2023-04-17 DIAGNOSIS — Z131 Encounter for screening for diabetes mellitus: Secondary | ICD-10-CM

## 2023-04-17 DIAGNOSIS — M25512 Pain in left shoulder: Secondary | ICD-10-CM | POA: Diagnosis not present

## 2023-04-17 DIAGNOSIS — G35 Multiple sclerosis: Secondary | ICD-10-CM

## 2023-04-17 DIAGNOSIS — E785 Hyperlipidemia, unspecified: Secondary | ICD-10-CM | POA: Diagnosis not present

## 2023-04-17 DIAGNOSIS — G8929 Other chronic pain: Secondary | ICD-10-CM | POA: Diagnosis not present

## 2023-04-17 DIAGNOSIS — Z1231 Encounter for screening mammogram for malignant neoplasm of breast: Secondary | ICD-10-CM

## 2023-04-17 DIAGNOSIS — Z0000 Encounter for general adult medical examination without abnormal findings: Secondary | ICD-10-CM

## 2023-04-17 DIAGNOSIS — M79672 Pain in left foot: Secondary | ICD-10-CM

## 2023-04-17 MED ORDER — DICLOFENAC SODIUM 1 % EX GEL
2.0000 g | Freq: Four times a day (QID) | CUTANEOUS | 1 refills | Status: AC
Start: 2023-04-17 — End: ?

## 2023-04-17 NOTE — Progress Notes (Signed)
 Subjective:  CC: Routine follow-up, shoulder pain, foot pain  HPI:  Kristen Boyle is a 63 y.o. person with a past medical history stated below and presents today for the stated chief complaint. Please see problem based assessment and plan for additional details.  Past Medical History:  Diagnosis Date   Abnormality of gait 06/10/2012   Depression    Headache    Hyperlipidemia    MS (multiple sclerosis) (HCC)     Current Outpatient Medications on File Prior to Visit  Medication Sig Dispense Refill   atorvastatin (LIPITOR) 20 MG tablet Take 1 tablet (20 mg total) by mouth daily. 90 tablet 3   BIOTIN PO Take by mouth.     gabapentin (NEURONTIN) 300 MG capsule Take 1 capsule by mouth at bedtime 30 capsule 0   ibuprofen (ADVIL,MOTRIN) 200 MG tablet Take 400 mg by mouth every 6 (six) hours as needed (For pain.).     ocrelizumab (OCREVUS) 300 MG/10ML injection INFUSE 600MG  IN 0.9% NS INTRAVENOUSLY AT 40ML\HR AND INCREASE BY 40ML\HR EVERY 30 MINUTES (MAX 200ML\HR) OVER AT LEAST 3.5HRS AS DIRECTED EVERY 6 MONTHS 20 mL 1   VITAMIN D PO Take by mouth.     No current facility-administered medications on file prior to visit.    Review of Systems: Please see assessment and plan for pertinent positives and negatives.  Objective:  There were no vitals filed for this visit.  Physical Exam: Constitutional: Chronically ill-appearing Cardiovascular: Regular rate and rhythm Pulmonary/Chest: lungs clear to auscultation bilaterally Abdominal: soft, non-tender, non-distended Extremities: No edema of the lower extremities bilaterally Psych: Pleasant affect Thought process is linear and is goal-directed.     Assessment & Plan:  Multiple sclerosis (HCC) Patient endorsing continued difficulties with ADLs, worsening function.  She has difficulties with ADLs including getting out of bed and transferring to her wheelchair, putting on clothes, using the shower, ambulating around her  apartment.  She states that this is gotten worse over the past year.  Patient states a few likely will benefit from physical therapy, I do agree with this as they would likely get benefit from strengthening exercises, working on balance, and working on alternative ways to complete her activities of daily living.  Plan: Home health PT and OT ordered for evaluation and treatment [facility] referral placed for social determinants of health needs   Healthcare maintenance Orders placed today for screening mammogram, she is out of date with this.  She would also be due for Pap smear and colonoscopy, but given overall functional status and health, risk and benefits should be discussed regarding these.  I am hopeful that should she be able to do physical therapy she may have improvement of her functional status to better allow for completion of routine screening.  She has not had a lipid profile for about a year, I will obtain 1 today, will also check A1c as this has not been checked in many years.  Acute pain of left foot Unclear etiology, patient denies any recent falls or trauma.  She is endorsing left midfoot pain on the dorsal aspect of the foot.  There is no frank swelling or erythema appreciated on examination.  She does have some tenderness with palpation.  She is able to walk despite the pain at this time.  Differential would include MSK pain versus crystalline arthropathy.  Plan: Will initiate Voltaren gel, return precautions discussed. X-rays not indicated at this time.   Chronic left shoulder pain Ongoing  for some years.  Patient states that this may have been exacerbated recently as she has been having to use her upper body more for ambulation, transfers, and wheelchair use.  She states that the pain is in the anterior shoulder as well as the posterior upper extremity in the triceps area.  She denies neck pain or radicular electric pain.  On examination today, she does  have restricted range of motion of the left shoulder.  She has a positive speeds test and cross body adduction test today.  She has pain with both active and passive range of motion of the left shoulder.  Etiology for her shoulder pain is likely multifactorial, differential would include subacromial impingement, rotator cuff tendinitis, AC joint degeneration, glenohumeral arthritis, and biceps tendinitis.  Plan: Physical therapy referral placed for further evaluation. Sports medicine referral for possible shoulder ultrasound, guided interventions.    Patient discussed with Dr. Elliot Cousin MD [facility]  PGY-1 Pager: [phone]  Phone: [phone] Date 04/17/2023  Time 4:06 PM

## 2023-04-17 NOTE — Assessment & Plan Note (Signed)
 Orders placed today for screening mammogram, she is out of date with this.  She would also be due for Pap smear and colonoscopy, but given overall functional status and health, risk and benefits should be discussed regarding these.  I am hopeful that should she be able to do physical therapy she may have improvement of her functional status to better allow for completion of routine screening.  She has not had a lipid profile for about a year, I will obtain 1 today, will also check A1c as this has not been checked in many years.

## 2023-04-17 NOTE — Assessment & Plan Note (Signed)
 Patient endorsing continued difficulties with ADLs, worsening function.  She has difficulties with ADLs including getting out of bed and transferring to her wheelchair, putting on clothes, using the shower, ambulating around her apartment.  She states that this is gotten worse over the past year.  Patient states a few likely will benefit from physical therapy, I do agree with this as they would likely get benefit from strengthening exercises, working on balance, and working on alternative ways to complete her activities of daily living.  Plan: Home health PT and OT ordered for evaluation and treatment [facility] referral placed for social determinants of health needs

## 2023-04-17 NOTE — Assessment & Plan Note (Signed)
 Ongoing for some years.  Patient states that this may have been exacerbated recently as she has been having to use her upper body more for ambulation, transfers, and wheelchair use.  She states that the pain is in the anterior shoulder as well as the posterior upper extremity in the triceps area.  She denies neck pain or radicular electric pain.  On examination today, she does have restricted range of motion of the left shoulder.  She has a positive speeds test and cross body adduction test today.  She has pain with both active and passive range of motion of the left shoulder.  Etiology for her shoulder pain is likely multifactorial, differential would include subacromial impingement, rotator cuff tendinitis, AC joint degeneration, glenohumeral arthritis, and biceps tendinitis.  Plan: Physical therapy referral placed for further evaluation. Sports medicine referral for possible shoulder ultrasound, guided interventions.

## 2023-04-17 NOTE — Patient Instructions (Signed)
 Thank you, [name] for allowing [place] to provide your care today.  I have ordered the following tests for you:  Lab Orders         Hemoglobin A1c      Referrals ordered today:   Referral Orders         AMB Referral [facility] Care Management         Ambulatory referral to Home Health         Ambulatory referral to Sports Medicine       Follow up: 3 months for routine follow     We look forward to seeing you next time. Please call our clinic at [phone] if you have any questions or concerns. The best time to call is Monday-Friday from 9am-4pm, but there is someone available 24/7. If after hours or the weekend, call the main hospital number and ask for the Internal Medicine Resident On-Call. If you need medication refills, please notify your pharmacy one week in advance and they will send [place] a request.   Thank you for trusting me with your care. Wishing you the best!  Lovie Macadamia MD [facility]

## 2023-04-17 NOTE — Assessment & Plan Note (Signed)
 Unclear etiology, patient denies any recent falls or trauma.  She is endorsing left midfoot pain on the dorsal aspect of the foot.  There is no frank swelling or erythema appreciated on examination.  She does have some tenderness with palpation.  She is able to walk despite the pain at this time.  Differential would include MSK pain versus crystalline arthropathy.  Plan: Will initiate Voltaren gel, return precautions discussed. X-rays not indicated at this time.

## 2023-04-18 LAB — LIPID PANEL
Chol/HDL Ratio: 2.9 ratio (ref 0.0–4.4)
Cholesterol, Total: 252 mg/dL — ABNORMAL HIGH (ref 100–199)
HDL: 86 mg/dL (ref >39)
LDL Chol Calc (NIH): 155 mg/dL — ABNORMAL HIGH (ref 0–99)
Triglycerides: 66 mg/dL (ref 0–149)
VLDL Cholesterol Cal: 11 mg/dL (ref 5–40)

## 2023-04-18 LAB — HEMOGLOBIN A1C
Est. average glucose Bld gHb Est-mCnc: 117 mg/dL
Hgb A1c MFr Bld: 5.7 % — ABNORMAL HIGH (ref 4.8–5.6)

## 2023-04-18 NOTE — Progress Notes (Signed)
 Internal Medicine Clinic Attending  Case discussed with the resident at the time of the visit.  We reviewed the resident's history and exam and pertinent patient test results.  I agree with the assessment, diagnosis, and plan of care documented in the resident's note.

## 2023-04-21 ENCOUNTER — Telehealth: Payer: Self-pay | Admitting: *Deleted

## 2023-04-21 NOTE — Progress Notes (Unsigned)
 Complex Care Management Note Care Guide Note  04/21/2023 Name: Kristen Boyle MRN: [id number] DOB: Aug 27, 1960   Complex Care Management Outreach Attempts: An unsuccessful telephone outreach was attempted today to offer the patient information about available complex care management services.  Follow Up Plan:  Additional outreach attempts will be made to offer the patient complex care management information and services.   Encounter Outcome:  No Answer  Gwenevere Ghazi  [facility]  [facility] Guide  Direct Dial: [phone]  Fax [phone]

## 2023-04-23 ENCOUNTER — Telehealth: Payer: Self-pay | Admitting: Student

## 2023-04-23 NOTE — Telephone Encounter (Signed)
 Attempted to call patient and mother several times regarding their lab results without success.  Will continue to try and reach them.

## 2023-04-24 NOTE — Progress Notes (Unsigned)
 Complex Care Management Note Care Guide Note  04/24/2023 Name: Kristen Boyle MRN: [id number] DOB: Aug 29, 1960   Complex Care Management Outreach Attempts: A second unsuccessful outreach was attempted today to offer the patient with information about available complex care management services.  Follow Up Plan:  Additional outreach attempts will be made to offer the patient complex care management information and services.   Encounter Outcome:  No Answer  Gwenevere Ghazi  [facility]  [facility] Guide  Direct Dial: [phone]  Fax [phone]

## 2023-04-25 NOTE — Progress Notes (Signed)
 Complex Care Management Note Care Guide Note  04/25/2023 Name: Kristen Boyle MRN: [id number] DOB: Apr 05, 1960   Complex Care Management Outreach Attempts: A third unsuccessful outreach was attempted today to offer the patient with information about available complex care management services.  Follow Up Plan:  No further outreach attempts will be made at this time. We have been unable to contact the patient to offer or enroll patient in complex care management services.  Encounter Outcome:  No Answer  Gwenevere Ghazi  [facility]  [facility] Guide  Direct Dial: [phone]  Fax [phone]

## 2023-04-30 ENCOUNTER — Telehealth: Payer: Self-pay | Admitting: Student

## 2023-04-30 NOTE — Telephone Encounter (Signed)
 I have attempted to call patient / daughter several time over the last week to discuss results without success. Letter with results to be sent to patient.

## 2023-05-08 ENCOUNTER — Telehealth: Payer: Self-pay | Admitting: *Deleted

## 2023-05-08 ENCOUNTER — Encounter: Payer: Self-pay | Admitting: Student

## 2023-05-08 NOTE — Telephone Encounter (Signed)
 Left voice message for Kristen Boyle to have his mother to give us  a call back regarding her mammogram scheduling.

## 2023-06-03 ENCOUNTER — Telehealth: Payer: Self-pay | Admitting: *Deleted

## 2023-06-03 NOTE — Telephone Encounter (Signed)
 Pt past due for appt. I called pt at [phone] and LVM for her to call office.  Called [phone] and spoke w/ daughter (on [place]). Scheduled visit for 06/18/23 at 830a with Dr. Godwin Lat. She verbalized understanding. Aware updated visit/labs needed prior to next Ocrevus  infusion.

## 2023-06-18 ENCOUNTER — Encounter: Payer: Self-pay | Admitting: Neurology

## 2023-06-18 ENCOUNTER — Ambulatory Visit (INDEPENDENT_AMBULATORY_CARE_PROVIDER_SITE_OTHER): Admitting: Neurology

## 2023-06-18 VITALS — BP 138/86 | HR 68 | Ht 60.0 in | Wt 155.0 lb

## 2023-06-18 DIAGNOSIS — G4700 Insomnia, unspecified: Secondary | ICD-10-CM | POA: Diagnosis not present

## 2023-06-18 DIAGNOSIS — R350 Frequency of micturition: Secondary | ICD-10-CM

## 2023-06-18 DIAGNOSIS — G35D Multiple sclerosis, unspecified: Secondary | ICD-10-CM

## 2023-06-18 DIAGNOSIS — Z114 Encounter for screening for human immunodeficiency virus [HIV]: Secondary | ICD-10-CM

## 2023-06-18 DIAGNOSIS — Z79899 Other long term (current) drug therapy: Secondary | ICD-10-CM

## 2023-06-18 DIAGNOSIS — G35 Multiple sclerosis: Secondary | ICD-10-CM | POA: Diagnosis not present

## 2023-06-18 DIAGNOSIS — R269 Unspecified abnormalities of gait and mobility: Secondary | ICD-10-CM

## 2023-06-18 DIAGNOSIS — G801 Spastic diplegic cerebral palsy: Secondary | ICD-10-CM | POA: Diagnosis not present

## 2023-06-18 MED ORDER — SERTRALINE HCL 50 MG PO TABS
50.0000 mg | ORAL_TABLET | Freq: Every day | ORAL | 3 refills | Status: DC
Start: 2023-06-18 — End: 2023-11-04

## 2023-06-18 NOTE — Progress Notes (Addendum)
 [facility]  PATIENT: Kristen Boyle DOB: 1960/10/02  REFERRING DOCTOR OR PCP:  Lorelle Roll, DO SOURCE: patient, notes from Dr. Tilda Fogo  _________________________________   HISTORICAL  CHIEF COMPLAINT:  Chief Complaint  Patient presents with   Follow-up    RM 10. Last seen 04/30/22. MS DMT: Ocrevus . Tolerating well.  Started having stomach pain last weekend after doing seated squats and improved now.     HISTORY OF PRESENT ILLNESS:  She is a 63 y.o. woman with multiple sclerosis diagnosed in 1999.  She is transferring car efrom Dr. Tilda Fogo who recently retired  Update 06/18/2023: She is on Ocrevus .   She tolerates the IV therapy well.    She started the Ocrevus  in 2022, switching from Betaserron.    Her last infusion was 03/03/2023   She had a spell where she noted black spots in her vision lasting about 30 minutes.   Black spots seemed to be in the entire visual field.  She dod not check monocular vs binocular.     She had some abdominal pain intermittently with jarring movements like getting dressed (needs assistance) over a period of a couple weeks - now better  Currently, gait is worse and she stopped using a walker ater a fall.   She felt she had some benefit from PT in the past but has not done over last year.  She can transfer independently most of the time but sometimes needs assistance.    She has left > right leg weakness.  She notes mild hand weakness.  Pressing buttons is more difficult.    She has left leg stiffness/spasticity as well.   She has numbness in her left hand and both legs.  She did not try the gabapentin .     She has some urinary incontinence so wears Depends.     She only gets a sensation a few seconds before she needs to go.    She has had notable dysconjugate gaze since 2017 or so.   Her left eye has very poor vision so does not note diplopia. .  Visual symptoms OS started before the MS diagnosis but were mild until more recently.      She has difficulty with sleep maintenance insomnia if she has a slow day but sleeps well when she has a more active day.    She has mild depression and is sometimes irritable.   She gets easily frustrated..    She has mild cognitive dysfunction.   She notes processing more slowly and trouble coming up with words.    She has reduced focus.  She has left knee pain.  Shoulders hurt now and then.     MS HISTORY: She was diagnosed in 1999 after presenting with a+cute left optic neuritis.Vision improved after steroids.   She had an MRI and lumbar puncture c/w MS.  She was placed on Betaseron .  She did not tolerate it too well and had flu-like issues..   Between 2000 and 2010 she had several exacerbations.  Once she had a drop foot, gait issues slowly progressed.    Vision also progressively worsened.    She went on Gilenya in 2011 but had bumps on her skin and GI issues and stopped.   She went back to Betaseron .  Tysabri had been discussed but she was JCV Ab positive.    In 2022 she was switched to Ocrevus .     IMAGING personally reviewed: MRI 8/172022 showed Multiple T2/FLAIR hyperintense foci in  the hemispheres, brainstem and cerebellum in a pattern consistent with chronic demyelinating plaque associated with multiple sclerosis.  None of the foci enhance or appear to be acute.  Compared to the MRI from 03/12/2017, there are no new lesions.  MRI of the cervical spine showed Multiple T2/FLAIR hyperintense foci in the brainstem and spinal cord is detailed above consistent with chronic demyelination associated with multiple sclerosis.  None of the foci enhance.  REVIEW OF SYSTEMS: Constitutional: No fevers, chills, sweats, or change in appetite Eyes: No visual changes, double vision, eye pain Ear, nose and throat: No hearing loss, ear pain, nasal congestion, sore throat Cardiovascular: No chest pain, palpitations Respiratory:  No shortness of breath at rest or with exertion.   No  wheezes GastrointestinaI: No nausea, vomiting, diarrhea, abdominal pain, fecal incontinence Genitourinary:  No dysuria, urinary retention or frequency.  No nocturia. Musculoskeletal:  shoulder and knee pain.   Integumentary: No rash, pruritus, skin lesions Neurological: as above Psychiatric: No depression at this time.  No anxiety Endocrine: No palpitations, diaphoresis, change in appetite, change in weigh or increased thirst Hematologic/Lymphatic:  No anemia, purpura, petechiae. Allergic/Immunologic: No itchy/runny eyes, nasal congestion, recent allergic reactions, rashes  ALLERGIES: Allergies  Allergen Reactions   Gilenya [Fingolimod]     Stomach upset    HOME MEDICATIONS:  Current Outpatient Medications:    atorvastatin  (LIPITOR) 20 MG tablet, Take 1 tablet (20 mg total) by mouth daily., Disp: 90 tablet, Rfl: 3   BIOTIN PO, Take by mouth., Disp: , Rfl:    gabapentin  (NEURONTIN ) 300 MG capsule, Take 1 capsule by mouth at bedtime, Disp: 30 capsule, Rfl: 0   ibuprofen  (ADVIL ,MOTRIN ) 200 MG tablet, Take 400 mg by mouth every 6 (six) hours as needed (For pain.)., Disp: , Rfl:    ocrelizumab  (OCREVUS ) 300 MG/10ML injection, INFUSE 600MG  IN 0.9% NS INTRAVENOUSLY AT 40ML\HR AND INCREASE BY 40ML\HR EVERY 30 MINUTES (MAX 200ML\HR) OVER AT LEAST 3.5HRS AS DIRECTED EVERY 6 MONTHS, Disp: 20 mL, Rfl: 1   sertraline  (ZOLOFT ) 50 MG tablet, Take 1 tablet (50 mg total) by mouth daily., Disp: 90 tablet, Rfl: 3   VITAMIN D  PO, Take by mouth., Disp: , Rfl:    diclofenac  Sodium (VOLTAREN ) 1 % GEL, Apply 2 g topically 4 (four) times daily. (Patient not taking: Reported on 06/18/2023), Disp: 100 g, Rfl: 1  PAST MEDICAL HISTORY: Past Medical History:  Diagnosis Date   Abnormality of gait 06/10/2012   Depression    Headache    Hyperlipidemia    MS (multiple sclerosis) (HCC)     PAST SURGICAL HISTORY: Past Surgical History:  Procedure Laterality Date   CHOLECYSTECTOMY N/A 06/20/2015    Procedure: LAPAROSCOPIC CHOLECYSTECTOMY;  Surgeon: Shela Derby, MD;  Location: [facility];  Service: General;  Laterality: N/A;   DENTAL SURGERY     TUBAL LIGATION Bilateral     FAMILY HISTORY: Family History  Problem Relation Age of Onset   Alcoholism Father    Colon cancer Maternal Aunt    Diabetes Paternal Aunt    Dementia Paternal Grandmother        Senile dementia of Alzheimer's type   Colon cancer Cousin    Multiple sclerosis Cousin     SOCIAL HISTORY:  Social History   Socioeconomic History   Marital status: Divorced    Spouse name: Not on file   Number of children: 4   Years of education: 2-College   Highest education level: Not on file  Occupational History  Comment: Disabled  Tobacco Use   Smoking status: Never   Smokeless tobacco: Never  Vaping Use   Vaping status: Never Used  Substance and Sexual Activity   Alcohol use: No    Alcohol/week: 0.0 standard drinks of alcohol   Drug use: No   Sexual activity: Not Currently  Other Topics Concern   Not on file  Social History Narrative   Patient lives at home with her daughter.   Disabled.   Education two years of college.   Right handed.   Social Drivers of Corporate investment banker Strain: Low Risk  (10/04/2021)   Overall Financial Resource Strain (CARDIA)    Difficulty of Paying Living Expenses: Not hard at all  Food Insecurity: No Food Insecurity (10/04/2021)   Hunger Vital Sign    Worried About Running Out of Food in the Last Year: Never true    Ran Out of Food in the Last Year: Never true  Transportation Needs: No Transportation Needs (10/04/2021)   PRAPARE - Administrator, Civil Service (Medical): No    Lack of Transportation (Non-Medical): No  Physical Activity: Insufficiently Active (10/04/2021)   Exercise Vital Sign    Days of Exercise per Week: 2 days    Minutes of Exercise per Session: 20 min  Stress: No Stress Concern Present (10/04/2021)   [employer] of Occupational  Health - Occupational Stress Questionnaire    Feeling of Stress : Only a little  Social Connections: Socially Isolated (10/04/2021)   Social Connection and Isolation Panel [NHANES]    Frequency of Communication with Friends and Family: More than three times a week    Frequency of Social Gatherings with Friends and Family: More than three times a week    Attends Religious Services: Never    Database administrator or Organizations: No    Attends Banker Meetings: Never    Marital Status: Divorced  Catering manager Violence: Not At Risk (10/04/2021)   Humiliation, Afraid, Rape, and Kick questionnaire    Fear of Current or Ex-Partner: No    Emotionally Abused: No    Physically Abused: No    Sexually Abused: No     PHYSICAL EXAM  Vitals:   06/18/23 0824  BP: 138/86  Pulse: 68  Weight: 155 lb (70.3 kg)  Height: 5' (1.524 m)    Body mass index is 30.27 kg/m.   General: The patient is well-developed and well-nourished and in no acute distress  HEENT:  Head is [place]/AT.  Sclera are anicteric.   Skin: Extremities are without rash or  edema.  Musculoskeletal:  Back is nontender.  Mild shoulder tenderness.    Neurologic Exam  Mental status: The patient is alert and oriented x 3 at the time of the examination. The patient has apparent normal recent and remote memory, with an apparently normal attention span and concentration ability.   Speech is normal.  Cranial nerves: Extraocular movements are reduced OS and she has incomplete INO and exophoria.  Pupils show left APD.  She has reduced vision bilaterally, severe on the left which she is only able to count fingers.  She can read large print on the right   Reduce colors OS.  Facial symmetry is present. There is good facial sensation to soft touch bilaterally.Facial strength is normal.  Trapezius and sternocleidomastoid strength is normal. No dysarthria is noted.    No obvious hearing deficits are noted.  Motor:  Muscle bulk  is normal.  Tone is normal. Strength is  4+-5 / 5 in left arm, 4+ to 5/5 right arm, 2/5 left leg x 3/5 quads and hamstrings and 4- right leg (4 quads).    She has decreased RAM in  hands  Sensory: Sensory testing is intact to pinprick, soft touch and vibration sensation in arms but reduced vibration in left leg.    Coordination: Cerebellar testing reveals reduced  finger-nose-finger and poor right HTS and unable to do left heel-to-shin .  Gait and station: Requires bilat support to stand  Reflexes: Deep tendon reflexes are symmetric and normal in arms but increased in legs, left > right with spread at knees.  No ankle clonus.       DIAGNOSTIC DATA (LABS, IMAGING, TESTING) - I reviewed patient records, labs, notes, testing and imaging myself where available.  Lab Results  Component Value Date   WBC 4.0 04/30/2022   HGB 13.1 04/30/2022   HCT 43.0 04/30/2022   MCV 78 (L) 04/30/2022   PLT 301 04/30/2022      Component Value Date/Time   NA 140 04/30/2022 1212   K 4.2 04/30/2022 1212   CL 103 04/30/2022 1212   CO2 22 04/30/2022 1212   GLUCOSE 89 04/30/2022 1212   GLUCOSE 93 04/16/2015 2254   BUN 16 04/30/2022 1212   CREATININE 1.06 (H) 04/30/2022 1212   CREATININE 0.89 09/09/2013 1115   CALCIUM  9.9 04/30/2022 1212   PROT 7.9 04/30/2022 1212   ALBUMIN 4.6 04/30/2022 1212   AST 32 04/30/2022 1212   ALT 49 (H) 04/30/2022 1212   ALKPHOS 131 (H) 04/30/2022 1212   BILITOT 0.4 04/30/2022 1212   GFRNONAA 84 12/01/2019 1625   GFRNONAA 73 09/09/2013 1115   GFRAA 96 12/01/2019 1625   GFRAA 84 09/09/2013 1115   Lab Results  Component Value Date   CHOL 252 (H) 04/17/2023   HDL 86 04/17/2023   LDLCALC 155 (H) 04/17/2023   TRIG 66 04/17/2023   CHOLHDL 2.9 04/17/2023   Lab Results  Component Value Date   HGBA1C 5.7 (H) 04/17/2023   No results found for: "VITAMINB12" Lab Results  Component Value Date   TSH 2.410 11/01/2015       ASSESSMENT AND PLAN  Multiple sclerosis  (HCC) - Plan: Hepatitis B surface antigen, HIV Antibody (routine testing w rflx), IgG, IgA, IgM, QuantiFERON-TB Gold Plus, CBC with Differential/Platelet, Hepatic function panel, Hepatitis B core antibody, total  High risk medication use - Plan: Hepatitis B surface antigen, HIV Antibody (routine testing w rflx), IgG, IgA, IgM, QuantiFERON-TB Gold Plus, CBC with Differential/Platelet, Hepatic function panel, Hepatitis B core antibody, total  Encounter for screening for HIV - Plan: HIV Antibody (routine testing w rflx)  Urinary frequency  Insomnia, unspecified type  Abnormality of gait  Spastic diplegia (HCC)    Continue Ocrevus  for now.  However, consider switch to Mavenclad or Aubagio.  We will check labs.   2.    Gabapentin  300 mg nightly for insomnia, RLS, dysesthesia prn 3.    Stay active and exercise if possible. 4.    Sertraline  for mood  RTC 5 months or sooner if new or worsening neurologic issues.   (Timing for CDC/D and LFT is chooses Mavenclad)  43-minute office visit with the majority of the time spent face-to-face for history and physical, discussion/counseling and decision-making.  Additional time with record review and documentation.  This visit is part of a comprehensive longitudinal care medical relationship regarding the patients primary diagnosis of MS and  related concerns.     Anwita Mencer A. Godwin Lat, MD, [facility] 06/18/2023, 9:11 AM Certified in Neurology, Clinical Neurophysiology, Sleep Medicine and Neuroimaging  [facility] [address] [place], [place] [address] [phone]

## 2023-06-19 ENCOUNTER — Telehealth: Payer: Self-pay | Admitting: *Deleted

## 2023-06-19 NOTE — Telephone Encounter (Signed)
 Mammogram August 19.2025 @ 1:00 pm to arrive 12:45 pm @ the breast center [phone]) left voice message for patient to contact the breast center is this is not good for them. Also information regarding the 75.00 no show fee. appointment mailed to the patient.

## 2023-06-20 ENCOUNTER — Ambulatory Visit: Payer: Self-pay | Admitting: Neurology

## 2023-06-20 DIAGNOSIS — G35 Multiple sclerosis: Secondary | ICD-10-CM

## 2023-06-20 DIAGNOSIS — R748 Abnormal levels of other serum enzymes: Secondary | ICD-10-CM

## 2023-06-20 NOTE — Telephone Encounter (Signed)
 I tried to reach Kristen Boyle about the lab results.  Please try to reach Again.  The liver enzymes (AST/ALT) are elevated.  This can be a problem with Ocrevus  but I was going to do additional lab work (I placed an order for her to come in to get hepatitis A and hepatitis C blood test, hepatitis B was fine) additionally please get the name of her primary care so we can send results to them.

## 2023-06-21 LAB — CBC WITH DIFFERENTIAL/PLATELET
Basophils Absolute: 0 10*3/uL (ref 0.0–0.2)
Basos: 1 %
EOS (ABSOLUTE): 0.1 10*3/uL (ref 0.0–0.4)
Eos: 3 %
Hematocrit: 43.4 % (ref 34.0–46.6)
Hemoglobin: 13.6 g/dL (ref 11.1–15.9)
Immature Grans (Abs): 0 10*3/uL (ref 0.0–0.1)
Immature Granulocytes: 0 %
Lymphocytes Absolute: 2.6 10*3/uL (ref 0.7–3.1)
Lymphs: 63 %
MCH: 25.3 pg — ABNORMAL LOW (ref 26.6–33.0)
MCHC: 31.3 g/dL — ABNORMAL LOW (ref 31.5–35.7)
MCV: 81 fL (ref 79–97)
Monocytes Absolute: 0.4 10*3/uL (ref 0.1–0.9)
Monocytes: 9 %
Neutrophils Absolute: 1 10*3/uL — ABNORMAL LOW (ref 1.4–7.0)
Neutrophils: 24 %
Platelets: 292 10*3/uL (ref 150–450)
RBC: 5.37 x10E6/uL — ABNORMAL HIGH (ref 3.77–5.28)
RDW: 15.3 % (ref 11.7–15.4)
WBC: 4.2 10*3/uL (ref 3.4–10.8)

## 2023-06-21 LAB — HEPATITIS B SURFACE ANTIGEN: Hepatitis B Surface Ag: NEGATIVE

## 2023-06-21 LAB — HEPATIC FUNCTION PANEL
ALT: 251 IU/L — ABNORMAL HIGH (ref 0–32)
AST: 92 IU/L — ABNORMAL HIGH (ref 0–40)
Albumin: 4.5 g/dL (ref 3.9–4.9)
Alkaline Phosphatase: 257 IU/L — ABNORMAL HIGH (ref 44–121)
Bilirubin Total: 0.3 mg/dL (ref 0.0–1.2)
Bilirubin, Direct: 0.13 mg/dL (ref 0.00–0.40)
Total Protein: 7.6 g/dL (ref 6.0–8.5)

## 2023-06-21 LAB — HEPATITIS B CORE ANTIBODY, TOTAL: Hep B Core Total Ab: NEGATIVE

## 2023-06-21 LAB — QUANTIFERON-TB GOLD PLUS
QuantiFERON Mitogen Value: >10 [IU]/mL
QuantiFERON Nil Value: 0.04 [IU]/mL
QuantiFERON TB1 Ag Value: 0.02 [IU]/mL
QuantiFERON TB2 Ag Value: 0.03 [IU]/mL
QuantiFERON-TB Gold Plus: NEGATIVE

## 2023-06-21 LAB — IGG, IGA, IGM
IgA/Immunoglobulin A, Serum: 368 mg/dL — ABNORMAL HIGH (ref 87–352)
IgG (Immunoglobin G), Serum: 1328 mg/dL (ref 586–1602)
IgM (Immunoglobulin M), Srm: 73 mg/dL (ref 26–217)

## 2023-06-21 LAB — HIV ANTIBODY (ROUTINE TESTING W REFLEX): HIV Screen 4th Generation wRfx: NONREACTIVE

## 2023-06-23 NOTE — Telephone Encounter (Addendum)
 Called pt at [phone]. LVM for her to call.   Called [phone]. LVM for pt to call  Called daughter (on [place]) at [phone]. Number not in service.

## 2023-06-23 NOTE — Addendum Note (Signed)
 Addended by: Jorie Newness on: 06/23/2023 05:14 PM   Modules accepted: Orders

## 2023-07-02 NOTE — Telephone Encounter (Signed)
 Pt was returning call informed Pt nurse will call back

## 2023-07-07 ENCOUNTER — Other Ambulatory Visit (INDEPENDENT_AMBULATORY_CARE_PROVIDER_SITE_OTHER): Payer: Self-pay

## 2023-07-07 DIAGNOSIS — R748 Abnormal levels of other serum enzymes: Secondary | ICD-10-CM | POA: Diagnosis not present

## 2023-07-07 DIAGNOSIS — Z0289 Encounter for other administrative examinations: Secondary | ICD-10-CM

## 2023-07-07 DIAGNOSIS — G35 Multiple sclerosis: Secondary | ICD-10-CM

## 2023-07-08 ENCOUNTER — Ambulatory Visit: Payer: Self-pay | Admitting: Neurology

## 2023-07-08 LAB — HEPATITIS A ANTIBODY, IGM: Hep A IgM: NEGATIVE

## 2023-07-08 LAB — HEPATIC FUNCTION PANEL
ALT: 36 IU/L — ABNORMAL HIGH (ref 0–32)
AST: 30 IU/L (ref 0–40)
Albumin: 4.4 g/dL (ref 3.9–4.9)
Alkaline Phosphatase: 119 IU/L (ref 44–121)
Bilirubin Total: 0.3 mg/dL (ref 0.0–1.2)
Bilirubin, Direct: 0.14 mg/dL (ref 0.00–0.40)
Total Protein: 7.3 g/dL (ref 6.0–8.5)

## 2023-07-08 LAB — HEPATITIS C ANTIBODY: Hep C Virus Ab: NONREACTIVE

## 2023-09-09 ENCOUNTER — Ambulatory Visit

## 2023-09-11 DIAGNOSIS — G35 Multiple sclerosis: Secondary | ICD-10-CM | POA: Diagnosis not present

## 2023-10-16 ENCOUNTER — Ambulatory Visit (INDEPENDENT_AMBULATORY_CARE_PROVIDER_SITE_OTHER): Admitting: Student

## 2023-10-16 ENCOUNTER — Ambulatory Visit (HOSPITAL_BASED_OUTPATIENT_CLINIC_OR_DEPARTMENT_OTHER)

## 2023-10-16 ENCOUNTER — Other Ambulatory Visit (HOSPITAL_BASED_OUTPATIENT_CLINIC_OR_DEPARTMENT_OTHER): Payer: Self-pay

## 2023-10-16 ENCOUNTER — Ambulatory Visit: Payer: Self-pay | Admitting: *Deleted

## 2023-10-16 DIAGNOSIS — G8929 Other chronic pain: Secondary | ICD-10-CM

## 2023-10-16 DIAGNOSIS — M25562 Pain in left knee: Secondary | ICD-10-CM

## 2023-10-16 MED ORDER — MELOXICAM 15 MG PO TABS
15.0000 mg | ORAL_TABLET | Freq: Every day | ORAL | 0 refills | Status: AC
  Filled 2023-10-16: qty 10, 10d supply, fill #0

## 2023-10-16 NOTE — Telephone Encounter (Signed)
 FYI Only or Action Required?: FYI only for provider.  Patient was last seen in primary care on 04/17/2023 by Gabino Boga, MD.  Called Nurse Triage reporting Knee Pain.  Symptoms began a week ago.  Interventions attempted: OTC medications: ibuprofen .  Symptoms are: gradually worsening.  Triage Disposition: See HCP Within 4 Hours (Or PCP Triage)  Patient/caregiver understands and will follow disposition?: no open appointment- UC advised  Reason for Disposition  [1] SEVERE pain (e.g., excruciating, unable to walk) AND [2] not improved after 2 hours of pain medicine  Answer Assessment - Initial Assessment Questions 1. LOCATION and RADIATION: Where is the pain located?      left 2. QUALITY: What does the pain feel like?  (e.g., sharp, dull, aching, burning)     Dull ache 3. SEVERITY: How bad is the pain? What does it keep you from doing?   (Scale 1-10; or mild, moderate, severe)     8/10 4. ONSET: When did the pain start? Does it come and go, or is it there all the time?     Started last week- worse today 5. RECURRENT: Have you had this pain before? If Yes, ask: When, and what happened then?     Off/on 6. SETTING: Has there been any recent work, exercise or other activity that involved that part of the body?      No- patient is only up and down to the bathroom 7. AGGRAVATING FACTORS: What makes the knee pain worse? (e.g., walking, climbing stairs, running)     Hurts to bend it 8. ASSOCIATED SYMPTOMS: Is there any swelling or redness of the knee?     Feels like it is locking, feels like swelling is present 9. OTHER SYMPTOMS: Do you have any other symptoms? (e.g., calf pain, chest pain, difficulty breathing, fever)     Swelling in lower leg, sharp pain in arm- comes and goes- last felt last week  Protocols used: Knee Pain-A-AH   Copied from CRM #[id number]. Topic: Clinical - Red Word Triage >> Oct 16, 2023  2:17 PM Mercer PEDLAR wrote: Red Word that prompted  transfer to Nurse Triage: Knee pain.

## 2023-10-16 NOTE — Progress Notes (Signed)
 Chief Complaint: Left knee pain    Discussed the use of AI scribe software for clinical note transcription with the patient, who gave verbal consent to proceed.  History of Present Illness Kristen Boyle is a 63 year old female with multiple sclerosis who presents with knee pain and swelling. Knee swelling began this morning, with pain persisting throughout the week. Pain is localized around the patella and accompanied by swelling. Episodes of knee locking occur, requiring her to 'pop it' to unlock, causing pain. No recent injury or falls. She uses warm compresses for relief and avoids cold compresses. Ibuprofen  provides temporary relief, but pain returns. No pain in the calf. She has multiple sclerosis, leading to worsening drop foot and decreased mobility. She uses a cane and walker but finds them cumbersome due to balance issues and narrow doorways.   Surgical History:   None  PMH/PSH/Family History/Social History/Meds/Allergies:    Past Medical History:  Diagnosis Date   Abnormality of gait 06/10/2012   Depression    Headache    Hyperlipidemia    MS (multiple sclerosis)    Past Surgical History:  Procedure Laterality Date   CHOLECYSTECTOMY N/A 06/20/2015   Procedure: LAPAROSCOPIC CHOLECYSTECTOMY;  Surgeon: Lynda Leos, MD;  Location: [facility];  Service: General;  Laterality: N/A;   DENTAL SURGERY     TUBAL LIGATION Bilateral    Social History   Socioeconomic History   Marital status: Divorced    Spouse name: Not on file   Number of children: 4   Years of education: 2-College   Highest education level: Not on file  Occupational History    Comment: Disabled  Tobacco Use   Smoking status: Never   Smokeless tobacco: Never  Vaping Use   Vaping status: Never Used  Substance and Sexual Activity   Alcohol use: No    Alcohol/week: 0.0 standard drinks of alcohol   Drug use: No   Sexual activity: Not Currently  Other Topics  Concern   Not on file  Social History Narrative   Patient lives at home with her daughter.   Disabled.   Education two years of college.   Right handed.   Social Drivers of Corporate investment banker Strain: Low Risk  (10/04/2021)   Overall Financial Resource Strain (CARDIA)    Difficulty of Paying Living Expenses: Not hard at all  Food Insecurity: No Food Insecurity (10/04/2021)   Hunger Vital Sign    Worried About Running Out of Food in the Last Year: Never true    Ran Out of Food in the Last Year: Never true  Transportation Needs: No Transportation Needs (10/04/2021)   PRAPARE - Administrator, Civil Service (Medical): No    Lack of Transportation (Non-Medical): No  Physical Activity: Insufficiently Active (10/04/2021)   Exercise Vital Sign    Days of Exercise per Week: 2 days    Minutes of Exercise per Session: 20 min  Stress: No Stress Concern Present (10/04/2021)   [employer] of Occupational Health - Occupational Stress Questionnaire    Feeling of Stress : Only a little  Social Connections: Socially Isolated (10/04/2021)   Social Connection and Isolation Panel    Frequency of Communication with Friends and Family: More than three times a week    Frequency of Social Gatherings with  Friends and Family: More than three times a week    Attends Religious Services: Never    Database administrator or Organizations: No    Attends Engineer, structural: Never    Marital Status: Divorced   Family History  Problem Relation Age of Onset   Alcoholism Father    Colon cancer Maternal Aunt    Diabetes Paternal Aunt    Dementia Paternal Grandmother        Senile dementia of Alzheimer's type   Colon cancer Cousin    Multiple sclerosis Cousin    Allergies  Allergen Reactions   Gilenya [Fingolimod]     Stomach upset   Current Outpatient Medications  Medication Sig Dispense Refill   meloxicam  (MOBIC ) 15 MG tablet Take 1 tablet (15 mg total) by mouth daily  for 10 days. 10 tablet 0   atorvastatin  (LIPITOR) 20 MG tablet Take 1 tablet (20 mg total) by mouth daily. 90 tablet 3   BIOTIN PO Take by mouth.     diclofenac  Sodium (VOLTAREN ) 1 % GEL Apply 2 g topically 4 (four) times daily. (Patient not taking: Reported on 06/18/2023) 100 g 1   gabapentin  (NEURONTIN ) 300 MG capsule Take 1 capsule by mouth at bedtime 30 capsule 0   ibuprofen  (ADVIL ,MOTRIN ) 200 MG tablet Take 400 mg by mouth every 6 (six) hours as needed (For pain.).     ocrelizumab  (OCREVUS ) 300 MG/10ML injection INFUSE 600MG  IN 0.9% NS INTRAVENOUSLY AT 40ML\HR AND INCREASE BY 40ML\HR EVERY 30 MINUTES (MAX 200ML\HR) OVER AT LEAST 3.5HRS AS DIRECTED EVERY 6 MONTHS 20 mL 1   sertraline  (ZOLOFT ) 50 MG tablet Take 1 tablet (50 mg total) by mouth daily. 90 tablet 3   VITAMIN D  PO Take by mouth.     No current facility-administered medications for this visit.   No results found.  Review of Systems:   A ROS was performed including pertinent positives and negatives as documented in the HPI.  Physical Exam :   Constitutional: NAD and appears stated age Neurological: Alert and oriented Psych: Appropriate affect and cooperative There were no vitals taken for this visit.   Comprehensive Musculoskeletal Exam:    Patient is seated in a wheelchair.  Exam of the left knee demonstrates no notable effusion and negative for erythema or warmth.  No significant medial or lateral joint line tenderness.  Stable collaterals with varus and valgus stress.  Minimal active knee flexion/extension strength.  Passive range of motion from 0 to 90 degrees.  Left foot drop present.  Calf is supple and nontender.  Imaging:   Xray (left knee 4 views): Negative for fracture or dislocation with mild patellofemoral spurring.  No significant effusion.   I personally reviewed and interpreted the radiographs.      Assessment & Plan Left knee pain Patient is experiencing acute on chronic left knee pain without  recent injury.  There are mild degenerative changes seen on x-ray but no significant findings.  No effusion present and no signs concerning for DVT.  At this point I suspect that episodes of locking are more neurogenic in nature due to underlying weakness rather than mechanical such as a meniscus issue.  Will prescribe a short course of meloxicam  to temporarily replace current ibuprofen  use.  Recommend knee compression sleeve for swelling.  Will plan to have her return back to neuro rehab for gait and general strengthening.   Left foot drop in the setting of multiple sclerosis   Chronic left foot  drop due to multiple sclerosis affects mobility and balance. Previous physical therapy was beneficial back in 2022.  Patient reports having an AFO brace although is not currently using this. Refer back to neuro rehabilitation for evaluation and treatment and continue following with neurology for MS management.      I personally saw and evaluated the patient, and participated in the management and treatment plan.  Leonce Reveal, PA-C Orthopedics

## 2023-10-16 NOTE — Telephone Encounter (Signed)
 Called pt - no answer; unable to leave a message d/t vm has not been set-up yet.

## 2023-10-17 NOTE — Addendum Note (Signed)
 Addended by: WOLFGANG CONLEY HERO on: 10/17/2023 08:12 AM   Modules accepted: Orders

## 2023-10-17 NOTE — Telephone Encounter (Signed)
 Called pt  again- no answer; unable to leave a message d/t vm has not been set-up yet.

## 2023-11-04 ENCOUNTER — Ambulatory Visit (INDEPENDENT_AMBULATORY_CARE_PROVIDER_SITE_OTHER): Payer: Self-pay | Admitting: Student

## 2023-11-04 ENCOUNTER — Other Ambulatory Visit: Payer: Self-pay

## 2023-11-04 ENCOUNTER — Encounter: Payer: Self-pay | Admitting: Student

## 2023-11-04 VITALS — BP 124/77 | HR 72 | Temp 98.0°F | Ht 60.0 in | Wt 155.0 lb

## 2023-11-04 DIAGNOSIS — Z780 Asymptomatic menopausal state: Secondary | ICD-10-CM

## 2023-11-04 DIAGNOSIS — K5900 Constipation, unspecified: Secondary | ICD-10-CM | POA: Diagnosis not present

## 2023-11-04 DIAGNOSIS — G35D Multiple sclerosis, unspecified: Secondary | ICD-10-CM | POA: Diagnosis not present

## 2023-11-04 DIAGNOSIS — Z1211 Encounter for screening for malignant neoplasm of colon: Secondary | ICD-10-CM

## 2023-11-04 DIAGNOSIS — Z79899 Other long term (current) drug therapy: Secondary | ICD-10-CM | POA: Diagnosis not present

## 2023-11-04 DIAGNOSIS — Z13820 Encounter for screening for osteoporosis: Secondary | ICD-10-CM

## 2023-11-04 DIAGNOSIS — E7800 Pure hypercholesterolemia, unspecified: Secondary | ICD-10-CM

## 2023-11-04 DIAGNOSIS — Z0279 Encounter for issue of other medical certificate: Secondary | ICD-10-CM

## 2023-11-04 DIAGNOSIS — R7303 Prediabetes: Secondary | ICD-10-CM

## 2023-11-04 LAB — POCT GLYCOSYLATED HEMOGLOBIN (HGB A1C): HbA1c, POC (prediabetic range): 6 % (ref 5.7–6.4)

## 2023-11-04 LAB — GLUCOSE, CAPILLARY: Glucose-Capillary: 92 mg/dL (ref 70–99)

## 2023-11-04 MED ORDER — SENNA 8.6 MG PO TABS
1.0000 | ORAL_TABLET | Freq: Every day | ORAL | 0 refills | Status: AC
  Filled 2023-11-04: qty 100, 100d supply, fill #0

## 2023-11-04 MED ORDER — POLYETHYLENE GLYCOL 3350 17 G PO PACK
17.0000 g | PACK | Freq: Two times a day (BID) | ORAL | 0 refills | Status: AC
  Filled 2023-11-04: qty 14, 7d supply, fill #0

## 2023-11-04 NOTE — Assessment & Plan Note (Addendum)
 Patient complains of 1 month history of constipation.  Patient has had constipation before and had tried MiraLAX  with little relief.  Patient states she has had times where she has to strain and develops bleeding.  Her daughter reports that she has had to help the patient have a bowel movement before by manually getting out stool ball.  They are requesting stool softeners.  Discussed sorbitol, senna, and MiraLAX .  Recommended good hydration.  Also discussed fiber supplementation.  Plan: - Start MiraLAX  twice daily - Start senna daily - If not working, recommend start adding sorbitol to coffee in the morning - Encouraged starting fiber supplementation

## 2023-11-04 NOTE — Assessment & Plan Note (Addendum)
 Patient has a past medical history of hyperlipidemia.  Most recent lipid panel on March 2025 showing total cholesterol 252 and LDL 155.  LDL not at goal.  Her goal LDL would be less than 100 for primary prevention.  Patient reports she has not been taking her atorvastatin , as she has made dietary changes.  We will check her lipid panel today.  Plan: - Follow-up lipid panel - Encourage adherence to atorvastatin  40 mg daily

## 2023-11-04 NOTE — Patient Instructions (Addendum)
 Kristen Boyle,Thank you for allowing me to take part in your care today.  Here are your instructions.  1.  For your constipation, please start taking MiraLAX  twice daily and take senna daily.  If this does not work, you can start adding sorbitol to your coffee every morning.  2.  Please start taking Benefiber or Metamucil to increase your fiber intake.  3.  I have referred you for colonoscopy  4.  I have referred you for DEXA scan to screen for osteoporosis  5.  Please schedule your mammogram  6.  Please come back in 1-2 months for Pap smear   PLEASE BRING YOUR MEDICATIONS TO EVERY APPOINTMENT  Thank you, Dr. Tobie  If you have any other questions please contact the internal medicine clinic at [phone] If it is after hours, please call the [place] [facility] at [phone] and then ask the person who picks up for the resident on call.

## 2023-11-04 NOTE — Progress Notes (Signed)
 CC: Constipation  HPI:  [name] is a 63 y.o. female with a past medical history of multiple sclerosis, depression, hyperlipidemia who presents for follow-up appointment.  Please see assessment and plan for full HPI.  Medications: Pain: Voltaren  gel, ibuprofen  Multiple sclerosis: Gabapentin  300 mg nightly, ocrelizumab  infusions Hyperlipidemia: Atorvastatin  20 mg daily  Vitamin D  deficiency: Vitamin D  supplementation   Past Medical History:  Diagnosis Date   Abnormality of gait 06/10/2012   Depression    Headache    Hyperlipidemia    MS (multiple sclerosis)      Current Outpatient Medications:    atorvastatin  (LIPITOR) 20 MG tablet, Take 1 tablet (20 mg total) by mouth daily., Disp: 90 tablet, Rfl: 3   BIOTIN PO, Take by mouth., Disp: , Rfl:    diclofenac  Sodium (VOLTAREN ) 1 % GEL, Apply 2 g topically 4 (four) times daily. (Patient not taking: Reported on 06/18/2023), Disp: 100 g, Rfl: 1   gabapentin  (NEURONTIN ) 300 MG capsule, Take 1 capsule by mouth at bedtime, Disp: 30 capsule, Rfl: 0   ibuprofen  (ADVIL ,MOTRIN ) 200 MG tablet, Take 400 mg by mouth every 6 (six) hours as needed (For pain.)., Disp: , Rfl:    ocrelizumab  (OCREVUS ) 300 MG/10ML injection, INFUSE 600MG  IN 0.9% NS INTRAVENOUSLY AT 40ML\HR AND INCREASE BY 40ML\HR EVERY 30 MINUTES (MAX 200ML\HR) OVER AT LEAST 3.5HRS AS DIRECTED EVERY 6 MONTHS, Disp: 20 mL, Rfl: 1   sertraline  (ZOLOFT ) 50 MG tablet, Take 1 tablet (50 mg total) by mouth daily., Disp: 90 tablet, Rfl: 3   VITAMIN D  PO, Take by mouth., Disp: , Rfl:   Review of Systems:    GI: Patient endorses constipation  Physical Exam:  Vitals:   11/04/23 1039  BP: 124/77  Pulse: 72  Temp: 98 F (36.7 C)  TempSrc: Oral  SpO2: 100%  Weight: 155 lb (70.3 kg)  Height: 5' (1.524 m)   General: Patient is sitting comfortably in the room  Head: Normocephalic, atraumatic  Cardio: Regular rate and rhythm, no murmurs, rubs or gallops Pulmonary:  Clear to ausculation bilaterally with no rales, rhonchi, and crackles  Abdomen: Soft, nontender with normoactive bowel sounds with no rebound or guarding    Assessment & Plan:   Assessment & Plan Prediabetes Patient has a past medical history of prediabetes.  7 months ago, she had an A1c which was 5.7.  A1c today is 6.0. She is still in the prediabetes range. Counseled her on lifestyle modifications today given that A1c did increase.  She is still prediabetic.  Plan: - Counseled on lifestyle modification Pure hypercholesterolemia Patient has a past medical history of hyperlipidemia.  Most recent lipid panel on March 2025 showing total cholesterol 252 and LDL 155.  LDL not at goal.  Her goal LDL would be less than 100 for primary prevention.  Patient reports she has not been taking her atorvastatin , as she has made dietary changes.  We will check her lipid panel today.  Plan: - Follow-up lipid panel - Encourage adherence to atorvastatin  40 mg daily Colon cancer screening Patient referred for colonoscopy for colon cancer screening Multiple sclerosis Patient has a past medical history of multiple sclerosis.  She is followed by neurology.  She is currently on ocrelizumab  infusions.  Doing well.  She spends most of her time in a wheelchair.  Request handicap placard, and requests occupational therapy  Plan: - Filled out patient's handicap placard form  - Filled out patient's daughter, who is patient's full-time caretaker, FMLA  paperwork - Referred for occupational therapy Encounter for osteoporosis screening in asymptomatic postmenopausal patient Patient referred for DEXA scan for osteoporosis screening Constipation, unspecified constipation type Patient complains of 1 month history of constipation.  Patient has had constipation before and had tried MiraLAX  with little relief.  Patient states she has had times where she has to strain and develops bleeding.  Her daughter reports that she has  had to help the patient have a bowel movement before by manually getting out stool ball.  They are requesting stool softeners.  Discussed sorbitol, senna, and MiraLAX .  Recommended good hydration.  Also discussed fiber supplementation.  Plan: - Start MiraLAX  twice daily - Start senna daily - If not working, recommend start adding sorbitol to coffee in the morning - Encouraged starting fiber supplementation   Patient discussed with Dr. Rosan Libby Blanch, DO Internal Medicine Resident PGY-3

## 2023-11-04 NOTE — Assessment & Plan Note (Addendum)
 Patient has a past medical history of multiple sclerosis.  She is followed by neurology.  She is currently on ocrelizumab  infusions.  Doing well.  She spends most of her time in a wheelchair.  Request handicap placard, and requests occupational therapy  Plan: - Filled out patient's handicap placard form  - Filled out patient's daughter, who is patient's full-time caretaker, FMLA paperwork - Referred for occupational therapy

## 2023-11-05 ENCOUNTER — Ambulatory Visit: Payer: Self-pay | Admitting: Student

## 2023-11-05 LAB — LIPID PANEL
Chol/HDL Ratio: 2.5 ratio (ref 0.0–4.4)
Cholesterol, Total: 198 mg/dL (ref 100–199)
HDL: 80 mg/dL (ref >39)
LDL Chol Calc (NIH): 103 mg/dL — ABNORMAL HIGH (ref 0–99)
Triglycerides: 86 mg/dL (ref 0–149)
VLDL Cholesterol Cal: 15 mg/dL (ref 5–40)

## 2023-11-05 NOTE — Progress Notes (Signed)
 Internal Medicine Clinic Attending  Case discussed with the resident at the time of the visit.  We reviewed the resident's history and exam and pertinent patient test results.  I agree with the assessment, diagnosis, and plan of care documented in the resident's note.

## 2023-11-11 ENCOUNTER — Telehealth: Payer: Self-pay | Admitting: *Deleted

## 2023-11-11 NOTE — Telephone Encounter (Signed)
 Mammogram appointment  09/09/2023 @ 1:00 p / canceled.

## 2023-11-24 ENCOUNTER — Encounter: Payer: Self-pay | Admitting: Radiology

## 2023-12-02 ENCOUNTER — Encounter: Payer: Self-pay | Admitting: Adult Health

## 2023-12-02 ENCOUNTER — Ambulatory Visit: Admitting: Adult Health

## 2023-12-02 VITALS — BP 136/86 | HR 66 | Ht 60.0 in | Wt 154.4 lb

## 2023-12-02 DIAGNOSIS — Z5181 Encounter for therapeutic drug level monitoring: Secondary | ICD-10-CM

## 2023-12-02 DIAGNOSIS — G35D Multiple sclerosis, unspecified: Secondary | ICD-10-CM

## 2023-12-02 NOTE — Patient Instructions (Signed)
 Your Plan:  Continue Ocrevus  for now Blood work today      Thank you for coming to see us  at [facility]. I hope we have been able to provide you high quality care today.  You may receive a patient satisfaction survey over the next few weeks. We would appreciate your feedback and comments so that we may continue to improve ourselves and the health of our patients.

## 2023-12-02 NOTE — Progress Notes (Signed)
 PATIENT: Kristen Boyle DOB: 27-May-1960  REASON FOR VISIT: follow up HISTORY FROM: patient PRIMARY NEUROLOGIST: Dr. Vear  Chief Complaint  Patient presents with   Follow-up    Pt in 4 with daughter Pt here for MS f/u Pt states same since last office visit Pt wants to discuss MS medication      HISTORY OF PRESENT ILLNESS: Today 12/02/23   Kristen Boyle is a 63 y.o. female who has been followed in this office for multiple sclerosis. Returns today for follow-up.  Here today with her daughter.  Overall she feels that her symptoms have remained stable.  She denies any new numbness or weakness.  Reports that she has arthritis in her hands that sometimes causes her some discomfort.  Denies any significant changes with the bowels or bladder.  Reports that about a month ago she had constipation, daughter reports bowel impaction but she was able to assist her. Since then her PCP has put her on laxatives and she has also adjusted her diet.  She is in a wheelchair today but she does use the furniture to get around at home.  Vision has remained stable although she reports at times that she can read words and other times she has a hard time seeing them.  At her last visit with Dr. Vear there was some discussion about switching to another disease modifying agent such as Mavenclad or Aubagio.  Patient states that she has reviewed the medicine and she is not sure that she wants to switch from Ocrevus .  She would like to be on the medication of equal caliber.   HISTORY She is a 63 y.o. woman with multiple sclerosis diagnosed in 1999.  She is transferring car efrom Dr. Jenel who recently retired   Update 06/18/2023: She is on Ocrevus .   She tolerates the IV therapy well.    She started the Ocrevus  in 2022, switching from Betaserron.    Her last infusion was 03/03/2023    She had a spell where she noted black spots in her vision lasting about 30 minutes.   Black spots seemed to be in the  entire visual field.  She dod not check monocular vs binocular.      She had some abdominal pain intermittently with jarring movements like getting dressed (needs assistance) over a period of a couple weeks - now better   Currently, gait is worse and she stopped using a walker ater a fall.   She felt she had some benefit from PT in the past but has not done over last year.  She can transfer independently most of the time but sometimes needs assistance.    She has left > right leg weakness.  She notes mild hand weakness.  Pressing buttons is more difficult.    She has left leg stiffness/spasticity as well.   She has numbness in her left hand and both legs.  She did not try the gabapentin .      She has some urinary incontinence so wears Depends.     She only gets a sensation a few seconds before she needs to go.     She has had notable dysconjugate gaze since 2017 or so.   Her left eye has very poor vision so does not note diplopia. .  Visual symptoms OS started before the MS diagnosis but were mild until more recently.      She has difficulty with sleep maintenance insomnia if she has a slow day  but sleeps well when she has a more active day.    She has mild depression and is sometimes irritable.   She gets easily frustrated..    She has mild cognitive dysfunction.   She notes processing more slowly and trouble coming up with words.    She has reduced focus.   She has left knee pain.  Shoulders hurt now and then.       MS HISTORY: She was diagnosed in 1999 after presenting with a+cute left optic neuritis.Vision improved after steroids.   She had an MRI and lumbar puncture c/w MS.  She was placed on Betaseron .  She did not tolerate it too well and had flu-like issues..   Between 2000 and 2010 she had several exacerbations.  Once she had a drop foot, gait issues slowly progressed.    Vision also progressively worsened.    She went on Gilenya in 2011 but had bumps on her skin and GI issues and stopped.    She went back to Betaseron .  Tysabri had been discussed but she was JCV Ab positive.    In 2022 she was switched to Ocrevus .       IMAGING personally reviewed: MRI 8/172022 showed Multiple T2/FLAIR hyperintense foci in the hemispheres, brainstem and cerebellum in a pattern consistent with chronic demyelinating plaque associated with multiple sclerosis.  None of the foci enhance or appear to be acute.  Compared to the MRI from 03/12/2017, there are no new lesions.   MRI of the cervical spine showed Multiple T2/FLAIR hyperintense foci in the brainstem and spinal cord is detailed above consistent with chronic demyelination associated with multiple sclerosis.  None of the foci enhance.    REVIEW OF SYSTEMS: Out of a complete 14 system review of symptoms, the patient complains only of the following symptoms, and all other reviewed systems are negative.   Listed in HPI  ALLERGIES: Allergies  Allergen Reactions   Gilenya [Fingolimod]     Stomach upset    HOME MEDICATIONS: Outpatient Medications Prior to Visit  Medication Sig Dispense Refill   atorvastatin  (LIPITOR) 20 MG tablet Take 1 tablet (20 mg total) by mouth daily. 90 tablet 3   BIOTIN PO Take by mouth.     diclofenac  Sodium (VOLTAREN ) 1 % GEL Apply 2 g topically 4 (four) times daily. 100 g 1   gabapentin  (NEURONTIN ) 300 MG capsule Take 1 capsule by mouth at bedtime 30 capsule 0   ibuprofen  (ADVIL ,MOTRIN ) 200 MG tablet Take 400 mg by mouth every 6 (six) hours as needed (For pain.).     ocrelizumab  (OCREVUS ) 300 MG/10ML injection INFUSE 600MG  IN 0.9% NS INTRAVENOUSLY AT 40ML\HR AND INCREASE BY 40ML\HR EVERY 30 MINUTES (MAX 200ML\HR) OVER AT LEAST 3.5HRS AS DIRECTED EVERY 6 MONTHS 20 mL 1   polyethylene glycol (MIRALAX ) 17 g packet Mix 1 packet (17 g) according to package directions and take by mouth 2 (two) times daily. 14 each 0   senna (SENOKOT) 8.6 MG TABS tablet Take 1 tablet (8.6 mg total) by mouth daily. 120 tablet 0   VITAMIN  D PO Take by mouth.     No facility-administered medications prior to visit.    PAST MEDICAL HISTORY: Past Medical History:  Diagnosis Date   Abnormality of gait 06/10/2012   Depression    Headache    Hyperlipidemia    MS (multiple sclerosis)     PAST SURGICAL HISTORY: Past Surgical History:  Procedure Laterality Date   CHOLECYSTECTOMY N/A 06/20/2015  Procedure: LAPAROSCOPIC CHOLECYSTECTOMY;  Surgeon: Lynda Leos, MD;  Location: [facility];  Service: General;  Laterality: N/A;   DENTAL SURGERY     TUBAL LIGATION Bilateral     FAMILY HISTORY: Family History  Problem Relation Age of Onset   Alcoholism Father    Colon cancer Maternal Aunt    Diabetes Paternal Aunt    Dementia Paternal Grandmother        Senile dementia of Alzheimer's type   Colon cancer Cousin    Multiple sclerosis Cousin     SOCIAL HISTORY: Social History   Socioeconomic History   Marital status: Divorced    Spouse name: Not on file   Number of children: 4   Years of education: 2-College   Highest education level: Not on file  Occupational History    Comment: Disabled  Tobacco Use   Smoking status: Never   Smokeless tobacco: Never  Vaping Use   Vaping status: Never Used  Substance and Sexual Activity   Alcohol use: No    Alcohol/week: 0.0 standard drinks of alcohol   Drug use: No   Sexual activity: Not Currently  Other Topics Concern   Not on file  Social History Narrative   Patient lives at home with her daughter.   Disabled.   Education two years of college.   Right handed.   Social Drivers of Corporate Investment Banker Strain: Low Risk  (10/04/2021)   Overall Financial Resource Strain (CARDIA)    Difficulty of Paying Living Expenses: Not hard at all  Food Insecurity: No Food Insecurity (10/04/2021)   Hunger Vital Sign    Worried About Running Out of Food in the Last Year: Never true    Ran Out of Food in the Last Year: Never true  Transportation Needs: No Transportation Needs  (10/04/2021)   PRAPARE - Administrator, Civil Service (Medical): No    Lack of Transportation (Non-Medical): No  Physical Activity: Insufficiently Active (10/04/2021)   Exercise Vital Sign    Days of Exercise per Week: 2 days    Minutes of Exercise per Session: 20 min  Stress: No Stress Concern Present (10/04/2021)   [employer] of Occupational Health - Occupational Stress Questionnaire    Feeling of Stress : Only a little  Social Connections: Socially Isolated (10/04/2021)   Social Connection and Isolation Panel    Frequency of Communication with Friends and Family: More than three times a week    Frequency of Social Gatherings with Friends and Family: More than three times a week    Attends Religious Services: Never    Database Administrator or Organizations: No    Attends Banker Meetings: Never    Marital Status: Divorced  Catering Manager Violence: Not At Risk (10/04/2021)   Humiliation, Afraid, Rape, and Kick questionnaire    Fear of Current or Ex-Partner: No    Emotionally Abused: No    Physically Abused: No    Sexually Abused: No      PHYSICAL EXAM  Vitals:   12/02/23 0937  BP: 136/86  Pulse: 66  Weight: 154 lb 6.4 oz (70 kg)  Height: 5' (1.524 m)   Body mass index is 30.15 kg/m.  Generalized: Well developed, in no acute distress   Neurological examination  Mentation: Alert oriented to time, place, history taking. Follows all commands speech and language fluent Cranial nerve II-XII: Pupils were equal round reactive to light. Extraocular movements were full, visual field were full on  confrontational test. Facial sensation and strength were normal. Uvula tongue midline. Head turning and shoulder shrug  were normal and symmetric. Motor: The motor testing reveals 5 over 5 strength in the right upper extremity.  4/5 strength in the left upper extremity.  3 out of 5 strength in the right lower extremity 2 out of 5 in the left lower extremity   Sensory: Sensory testing is intact to soft touch on all 4 extremities. No evidence of extinction is noted.  Coordination: Cerebellar testing reveals good finger-nose-finger and heel-to-shin bilaterally.  Gait and station: Gait is normal. Tandem gait is normal. Romberg is negative. No drift is seen.  Reflexes: Deep tendon reflexes are symmetric and normal bilaterally.   DIAGNOSTIC DATA (LABS, IMAGING, TESTING) - I reviewed patient records, labs, notes, testing and imaging myself where available.  Lab Results  Component Value Date   WBC 4.2 06/18/2023   HGB 13.6 06/18/2023   HCT 43.4 06/18/2023   MCV 81 06/18/2023   PLT 292 06/18/2023      Component Value Date/Time   NA 140 04/30/2022 1212   K 4.2 04/30/2022 1212   CL 103 04/30/2022 1212   CO2 22 04/30/2022 1212   GLUCOSE 89 04/30/2022 1212   GLUCOSE 93 04/16/2015 2254   BUN 16 04/30/2022 1212   CREATININE 1.06 (H) 04/30/2022 1212   CREATININE 0.89 09/09/2013 1115   CALCIUM  9.9 04/30/2022 1212   PROT 7.3 07/07/2023 0942   ALBUMIN 4.4 07/07/2023 0942   AST 30 07/07/2023 0942   ALT 36 (H) 07/07/2023 0942   ALKPHOS 119 07/07/2023 0942   BILITOT 0.3 07/07/2023 0942   GFRNONAA 84 12/01/2019 1625   GFRNONAA 73 09/09/2013 1115   GFRAA 96 12/01/2019 1625   GFRAA 84 09/09/2013 1115   Lab Results  Component Value Date   CHOL 198 11/04/2023   HDL 80 11/04/2023   LDLCALC 103 (H) 11/04/2023   TRIG 86 11/04/2023   CHOLHDL 2.5 11/04/2023   Lab Results  Component Value Date   HGBA1C 6.0 11/04/2023   No results found for: VITAMINB12 Lab Results  Component Value Date   TSH 2.410 11/01/2015      ASSESSMENT AND PLAN 63 y.o. year old female  has a past medical history of Abnormality of gait (06/10/2012), Depression, Headache, Hyperlipidemia, and MS (multiple sclerosis). here with:  Multiple sclerosis  - Continue Ocrevus  infusions for now. advised I would discuss disease modifying agents with Dr. Vear. - Blood work today,  CBC, CMP, IgG, IgM, IgA - Advised if symptoms worsen or she develops new symptoms she should let us  know - Follow-up in 6 months or sooner if needed     Orders Placed This Encounter  Procedures   Comprehensive metabolic panel with GFR   CBC with Differential/Platelet   IgG, IgA, IgM     Duwaine Russell, MSN, NP-C 12/02/2023, 10:28 AM [facility] [address], [address] [place], [place] [address] [phone]

## 2023-12-03 LAB — COMPREHENSIVE METABOLIC PANEL WITH GFR
ALT: 28 IU/L (ref 0–32)
AST: 29 IU/L (ref 0–40)
Albumin: 4.8 g/dL (ref 3.9–4.9)
Alkaline Phosphatase: 96 IU/L (ref 49–135)
BUN/Creatinine Ratio: 18 (ref 12–28)
BUN: 17 mg/dL (ref 8–27)
Bilirubin Total: 0.3 mg/dL (ref 0.0–1.2)
CO2: 21 mmol/L (ref 20–29)
Calcium: 9.8 mg/dL (ref 8.7–10.3)
Chloride: 104 mmol/L (ref 96–106)
Creatinine, Ser: 0.94 mg/dL (ref 0.57–1.00)
Globulin, Total: 2.9 g/dL (ref 1.5–4.5)
Glucose: 91 mg/dL (ref 70–99)
Potassium: 4.3 mmol/L (ref 3.5–5.2)
Sodium: 142 mmol/L (ref 134–144)
Total Protein: 7.7 g/dL (ref 6.0–8.5)
eGFR: 67 mL/min/1.73 (ref >59)

## 2023-12-03 LAB — CBC WITH DIFFERENTIAL/PLATELET
Basophils Absolute: 0 x10E3/uL (ref 0.0–0.2)
Basos: 1 %
EOS (ABSOLUTE): 0.1 x10E3/uL (ref 0.0–0.4)
Eos: 3 %
Hematocrit: 44.8 % (ref 34.0–46.6)
Hemoglobin: 14 g/dL (ref 11.1–15.9)
Immature Grans (Abs): 0 x10E3/uL (ref 0.0–0.1)
Immature Granulocytes: 0 %
Lymphocytes Absolute: 2.6 x10E3/uL (ref 0.7–3.1)
Lymphs: 64 %
MCH: 25 pg — ABNORMAL LOW (ref 26.6–33.0)
MCHC: 31.3 g/dL — ABNORMAL LOW (ref 31.5–35.7)
MCV: 80 fL (ref 79–97)
Monocytes Absolute: 0.3 x10E3/uL (ref 0.1–0.9)
Monocytes: 7 %
Neutrophils Absolute: 1 x10E3/uL — ABNORMAL LOW (ref 1.4–7.0)
Neutrophils: 25 %
Platelets: 290 x10E3/uL (ref 150–450)
RBC: 5.59 x10E6/uL — ABNORMAL HIGH (ref 3.77–5.28)
RDW: 14.8 % (ref 11.7–15.4)
WBC: 4.1 x10E3/uL (ref 3.4–10.8)

## 2023-12-03 LAB — IGG, IGA, IGM
IgG (Immunoglobin G), Serum: 1386 mg/dL (ref 586–1602)
IgM (Immunoglobulin M), Srm: 68 mg/dL (ref 26–217)
Immunoglobulin A, (IgA) QN, Serum: 375 mg/dL — ABNORMAL HIGH (ref 87–352)

## 2023-12-04 ENCOUNTER — Encounter: Payer: Self-pay | Admitting: *Deleted

## 2023-12-04 NOTE — Progress Notes (Signed)
 Discussed with Dr. Vear, ok for her to stay on Ocrevus  for now.

## 2023-12-08 ENCOUNTER — Ambulatory Visit: Payer: Self-pay | Admitting: Adult Health

## 2023-12-09 ENCOUNTER — Ambulatory Visit: Attending: Internal Medicine | Admitting: Physical Therapy

## 2023-12-09 ENCOUNTER — Other Ambulatory Visit: Payer: Self-pay

## 2023-12-09 ENCOUNTER — Encounter: Payer: Self-pay | Admitting: Occupational Therapy

## 2023-12-09 ENCOUNTER — Ambulatory Visit: Admitting: Occupational Therapy

## 2023-12-09 DIAGNOSIS — M25562 Pain in left knee: Secondary | ICD-10-CM | POA: Insufficient documentation

## 2023-12-09 DIAGNOSIS — R29818 Other symptoms and signs involving the nervous system: Secondary | ICD-10-CM | POA: Diagnosis not present

## 2023-12-09 DIAGNOSIS — R278 Other lack of coordination: Secondary | ICD-10-CM | POA: Insufficient documentation

## 2023-12-09 DIAGNOSIS — R296 Repeated falls: Secondary | ICD-10-CM | POA: Insufficient documentation

## 2023-12-09 DIAGNOSIS — M6281 Muscle weakness (generalized): Secondary | ICD-10-CM

## 2023-12-09 DIAGNOSIS — G8929 Other chronic pain: Secondary | ICD-10-CM | POA: Diagnosis not present

## 2023-12-09 DIAGNOSIS — R2689 Other abnormalities of gait and mobility: Secondary | ICD-10-CM | POA: Insufficient documentation

## 2023-12-09 DIAGNOSIS — R2681 Unsteadiness on feet: Secondary | ICD-10-CM | POA: Diagnosis not present

## 2023-12-09 DIAGNOSIS — Z741 Need for assistance with personal care: Secondary | ICD-10-CM

## 2023-12-09 NOTE — Therapy (Signed)
 OUTPATIENT OCCUPATIONAL THERAPY NEURO EVALUATION  Patient Name: SUMAN TRIVEDI MRN: [id number] DOB:02/28/1960, 63 y.o., female Today's Date: 12/09/2023  PCP: Bernadine Manos, MD REFERRING PROVIDER: Lovie Clarity, MD  END OF SESSION:  OT End of Session - 12/09/23 0857     Visit Number 1    Number of Visits 20    Date for Recertification  03/19/24    Authorization Type Medicare A&B/Medicaid [id number]    OT Start Time 0855    OT Stop Time 0930    OT Time Calculation (min) 35 min    Equipment Utilized During Treatment Dynamometer    Activity Tolerance Patient tolerated treatment well    Behavior During Therapy [facility] for tasks assessed/performed          Past Medical History:  Diagnosis Date   Abnormality of gait 06/10/2012   Depression    Headache    Hyperlipidemia    MS (multiple sclerosis)    Past Surgical History:  Procedure Laterality Date   CHOLECYSTECTOMY N/A 06/20/2015   Procedure: LAPAROSCOPIC CHOLECYSTECTOMY;  Surgeon: Lynda Leos, MD;  Location: [facility];  Service: General;  Laterality: N/A;   DENTAL SURGERY     TUBAL LIGATION Bilateral    Patient Active Problem List   Diagnosis Date Noted   Chronic left shoulder pain 04/17/2023   Acute pain of left foot 04/17/2023   Osteoarthritis of left knee 03/13/2022   Elevated serum creatinine 10/04/2021   Right wrist pain 07/19/2020   Hot flashes due to menopause 12/01/2019   Encounter for therapeutic drug monitoring 04/12/2016   Constipation 04/12/2016   Vaginal atrophy 04/12/2016   Bilateral lower extremity edema with recent DOE 11/02/2015   Microcytosis 10/16/2014   Vitamin D  insufficiency 04/20/2014   Abnormality of gait 09/13/2013   Depression 09/19/2011   Multiple sclerosis 03/06/2011   Healthcare maintenance 03/06/2011   Hyperlipidemia 09/17/2010    ONSET DATE: Referral: 11/04/2023  REFERRING DIAG: G35.D (ICD-10-CM) - Multiple sclerosis  THERAPY DIAG:  Muscle weakness (generalized)  Other lack  of coordination  Unsteadiness on feet  Other symptoms and signs involving the nervous system  Repeated falls  Need for assistance with personal care  Rationale for Evaluation and Treatment: Rehabilitation  SUBJECTIVE:   SUBJECTIVE STATEMENT: Pt arrived late for OT eval today.  She is familiar with this clinic from previous PT but no OT documented in past 10+ years.  Pt has long history of Multiple Sclerosis and knee arthritis but reports she is getting more arthritic in her hands.  Pt reported she has a little bit of toothache - except she has no teeth.  But he face was a bit swollen the other day - famiy encouraged to contact MD or dentist if issue persists.   Pt accompanied by: self and family member - daughter - Agricultural Consultant (flight attendant)  PERTINENT HISTORY: MS since 2013 as well as history of depression, LE edema, osteoarthritis (B knees)  PRECAUTIONS: Fall  WEIGHT BEARING RESTRICTIONS: No  PAIN:  Are you having pain? No - little bit of toothache - except has no teeth.  FALLS: Has patient fallen in last 6 months? Yes. Number of falls Has fallen trying to get up into the bed, and trying to get to the bathroom.   LIVING ENVIRONMENT: Lives with: lives with their family - daughter & boyfriend Lives in: House/apartment - townhome Stairs: No Has following equipment at home: Single point cane, Environmental Consultant - 2 wheeled, Environmental Consultant - 4 wheeled, Wheelchair (manual), Graybar electric, and Grab bars  PLOF: Independent with basic ADLs and Needs assistance with homemaking  PATIENT GOALS: Pt reported get up when I fall and walk faster to get to the bathroom.  OBJECTIVE:  Note: Objective measures were completed at Evaluation unless otherwise noted.  HAND DOMINANCE: Right  ADLs:   Pt's daughter reports she will help pt due to her low energy and they have no assistance coming to home.  Pt can do things herself but daughter helps so she has energy for the rest of the day.   Daughter may be  gone over night due to job as a financial controller.  Pt tends to stay in her room where she has a refrigerator but she does have to walk to the bathroom, just outside the bedroom door and pt does so while holding onto furniture.   Overall ADLs: Mod I for basic ADLs but gets assistance when available and has history of falls Transfers/ambulation related to ADLs: Can get OOB on her own but still has trouble sometimes due to weak core.  Pt's daughter helps her back to bed Eating: Ind Grooming: Daughter combs hair, Pt rinses her mouth herself UB Dressing: Pt can do this on her own. LB Dressing: Pt reports changing her own pull-up s/p incontinence  Toileting: Has accidents of B&B and changes her own pull up.  Accidents of BM occur mostly if she has to take something for constipation Bathing: Pt reports she will get in the shower and clean herself if she has an accident. Tub Shower transfers: Pt holds onto furniture and walk to get to the bathroom.  Pt reports that upon return from the bathroom she usually has to sit in her chair a little bit and then she works to get back in the bed - ie) It takes me awhile.  Daughter reports that is is hard for pt to lift her legs high enoguh to get them in the bed Equipment: Transfer tub bench  IADLs: Shopping: Daughter Light housekeeping: Daughter, Pt reports she will help fold some laundry Meal Prep: Has refrigerator in her bedroom, and will get foods like yogurt, fruit, salads etc (no microwave), when her daughter is not home. Community mobility: Assisted with WC Medication management: Pt takes meds from pill bottles Financial management: TBA Handwriting: TBA  MOBILITY STATUS: Needs Assist: furniture walks at home (walker does not fit through the doorways), Hx of falls, and difficulty carrying objections with ambulation  POSTURE COMMENTS:  rounded shoulders and forward head Sitting balance: No issues while seated in WC at eval  ACTIVITY  TOLERANCE: Activity tolerance: Limited - prefers the bed  FUNCTIONAL OUTCOME MEASURES: TBA - PSFS  Modified Barthel Index of ADLS ~ Modified Barthel Index Anastacio et al.) = 59 points Interpretation: 21-60 Severe dependency  UPPER EXTREMITY ROM:    Active ROM - TBA Right eval Left eval  Shoulder flexion    Shoulder abduction    Shoulder adduction    Shoulder extension    Shoulder internal rotation    Shoulder external rotation    Elbow flexion    Elbow extension    Wrist flexion    Wrist extension    Wrist ulnar deviation    Wrist radial deviation    Wrist pronation    Wrist supination    (Blank rows = not tested)  UPPER EXTREMITY MMT:     MMT - TBA Right eval Left eval  Shoulder flexion    Shoulder abduction    Shoulder adduction    Shoulder extension  Shoulder internal rotation    Shoulder external rotation    Middle trapezius    Lower trapezius    Elbow flexion    Elbow extension    Wrist flexion    Wrist extension    Wrist ulnar deviation    Wrist radial deviation    Wrist pronation    Wrist supination    (Blank rows = not tested)  HAND FUNCTION: Grip strength: Right: 7.7, 9.0  lbs; Left: 11.9, 8.1 lbs  COORDINATION: TBA  SENSATION: TBA  EDEMA: NA  MUSCLE TONE: WFL  COGNITION: Overall cognitive status: Impaired - pt deferred to daughter to answer questions and indicated assistance needed with written material  VISION: Subjective report: Limited by time at eval Baseline vision: TBA Visual history: TBA  VISION ASSESSMENT: Not tested  Patient has difficulty with following activities due to following visual impairments: None reported at eval  EVAL OBSERVATIONS: Pt arrived with daughter in manual WC.  The pt appears well kept and has jacket donned.  Pt has limitations with end range ROM and is weak in BUEs affecting safety with ADLs.  She often defers to her daughter to answer questions and admits to falls but unable to correctly identify  need to keep her phone with her at all times for safety.                                                                                                                              TODAY'S TREATMENT:    OT educated pt on rehabilitation process and results of objective measures in relation to pt specific goals. Unable to introduce HEP due to late arrival for evaluation today.   PATIENT EDUCATION: Education details: OT role and POC Considerations Person educated: Patient and Child(ren) Education method: Explanation and Verbal cues Education comprehension: verbalized understanding and needs further education  HOME EXERCISE PROGRAM: TBD   GOALS: Goals reviewed with patient? Yes  SHORT TERM GOALS: Target date: 01/21/24  Patient will demonstrate initial B UE HEP with 25% verbal cues or less for proper execution. Baseline: New to outpt OT s/p MS dx 2013 Goal status: INITIAL  2.  Pt will participate in UE strengthening and functional tasks to improve grip strength by at least 2-3 lbs bilaterally to support safe device use and transfers. Baseline: Right: 7.7, 9.0  lbs; Left: 11.9, 8.1 lbs Goal status: INITIAL  3.  Patient will demo improved FM coordination as evidenced by completing nine-hole peg with use of R/L UE in TBD seconds.  Baseline: TBA Goal status: INITIAL  4.  Pt/daughter will verbalize understanding of adapted strategies and/or equipment PRN to increase safety and independence with ADLs and IADLs (I.e. with decreased pain and joint protection).             Baseline: New to outpt OT             Goal Status: INITIAL  5.  Pt will complete toileting routine (transfer,  hygiene, return to bed) with no episodes of instability or near-fall, utilizing energy conservation and appropriate DME as trained. Baseline: History of falls Goal status: INITIAL   LONG TERM GOALS: Target date: 03/20/24  Patient will demonstrate updated B UE HEP with visual handouts only for proper execution  to improve strength and coordination.  Baseline: New to outpt OT s/p MS dx 2013; Grip strength: Right: 7.7, 9.0  lbs; Left: 11.9, 8.1 lbs Goal status: INITIAL  2.  Pt will implement use of 3-4 energy conservation strategies (task modification, planned rest breaks, environmental setup, adaptive tools) to prevent exhaustion and reduce reliance on her daughter for ADLs. Baseline: New to outpt OT s/p MS dx 2013 Goal status: INITIAL  3.  Pt will use a memory strategies (phone reminders, notebook, wall calendar) to track medications, appointments and safety recommendations r/t AE and phone use when alone with 90% consistency Baseline: New to outpt OT  Goal status: INITIAL  4.  Patient will report at least two-point increase in average PSFS score or at least three-point increase in a single activity score indicating functionally significant improvement given minimum detectable change. Baseline: TBA total score (See above for individual activity scores) Goal status: INITIAL   ASSESSMENT:  CLINICAL IMPRESSION: Patient is a 63 y.o. female who was seen today for occupational therapy evaluation for Multiple Sclerosis. Hx includes depression, LE edema, osteoarthritis (B knees). Patient currently presents below baseline level of function and demonstrates functional deficits and impairments in strength coordination and safety as noted by history of falls and increased need for WC as well as furniture surfing at home. Pt would benefit from skilled OT services in the outpatient setting to work on impairments as noted below to help pt return to PLOF as able.    PERFORMANCE DEFICITS: in functional skills including ADLs, IADLs, coordination, dexterity, ROM, strength, pain, flexibility, Fine motor control, Gross motor control, mobility, balance, endurance, continence, decreased knowledge of precautions, decreased knowledge of use of DME, and UE functional use, cognitive skills including learn, memory, problem  solving, safety awareness, thought, and understand, and psychosocial skills including coping strategies, environmental adaptation, habits, interpersonal interactions, and routines and behaviors.   IMPAIRMENTS: are limiting patient from ADLs, IADLs, rest and sleep, leisure, and social participation.   CO-MORBIDITIES: may have co-morbidities  that affects occupational performance. Patient will benefit from skilled OT to address above impairments and improve overall function.  MODIFICATION OR ASSISTANCE TO COMPLETE EVALUATION: Min-Moderate modification of tasks or assist with assess necessary to complete an evaluation.  OT OCCUPATIONAL PROFILE AND HISTORY: Detailed assessment: Review of records and additional review of physical, cognitive, psychosocial history related to current functional performance.  CLINICAL DECISION MAKING: Moderate - several treatment options, min-mod task modification necessary  REHAB POTENTIAL: Fair Duration of dx  EVALUATION COMPLEXITY: Moderate    PLAN:  OT FREQUENCY: 1-2x/week  OT DURATION: 12 weeks - POC extended for scheduled purposes  PLANNED INTERVENTIONS: 97168 OT Re-evaluation, 97535 self care/ADL training, 02889 therapeutic exercise, 97530 therapeutic activity, 97112 neuromuscular re-education, 97140 manual therapy, 97113 aquatic therapy, [id number] Cognitive training (first 15 min), 02869 Cognitive training(each additional 15 min), 02457 Wheelchair Management, 97760 Orthotic Initial, [id number] Orthotic/Prosthetic subsequent, balance training, functional mobility training, visual/perceptual remediation/compensation, psychosocial skills training, energy conservation, coping strategies training, patient/family education, and DME and/or AE instructions  RECOMMENDED OTHER SERVICES: May need dental consult and potentially ST services  CONSULTED AND AGREED WITH PLAN OF CARE: Patient and family member/caregiver  PLAN FOR NEXT SESSION:  Assess coordination/vision &  PSFS HEPs - putty, coordination etc AE considerations ADL comp training  Review specific goals  Clarita LITTIE Pride, OT 12/09/2023, 6:35 PM

## 2023-12-09 NOTE — Therapy (Unsigned)
 OUTPATIENT PHYSICAL THERAPY NEURO EVALUATION   Patient Name: Kristen Boyle MRN: [id number] DOB:1960/12/09, 63 y.o., female Today's Date: 12/10/2023   PCP: Bernadine Manos, MD REFERRING PROVIDER: Emiliano Leonce CROME, PA-C  END OF SESSION:  PT End of Session - 12/10/23 1524     Visit Number 1    Number of Visits 17    Date for Recertification  02/06/24    Authorization Type Medicare    Authorization Time Period 12-09-23 - 02-21-24    Progress Note Due on Visit 10    PT Start Time 0933    PT Stop Time 1017    PT Time Calculation (min) 44 min    Equipment Utilized During Treatment Gait belt    Activity Tolerance Patient tolerated treatment well    Behavior During Therapy [facility] for tasks assessed/performed          Past Medical History:  Diagnosis Date   Abnormality of gait 06/10/2012   Depression    Headache    Hyperlipidemia    MS (multiple sclerosis)    Past Surgical History:  Procedure Laterality Date   CHOLECYSTECTOMY N/A 06/20/2015   Procedure: LAPAROSCOPIC CHOLECYSTECTOMY;  Surgeon: Lynda Leos, MD;  Location: [facility];  Service: General;  Laterality: N/A;   DENTAL SURGERY     TUBAL LIGATION Bilateral    Patient Active Problem List   Diagnosis Date Noted   Chronic left shoulder pain 04/17/2023   Acute pain of left foot 04/17/2023   Osteoarthritis of left knee 03/13/2022   Elevated serum creatinine 10/04/2021   Right wrist pain 07/19/2020   Hot flashes due to menopause 12/01/2019   Encounter for therapeutic drug monitoring 04/12/2016   Constipation 04/12/2016   Vaginal atrophy 04/12/2016   Bilateral lower extremity edema with recent DOE 11/02/2015   Microcytosis 10/16/2014   Vitamin D  insufficiency 04/20/2014   Abnormality of gait 09/13/2013   Depression 09/19/2011   Multiple sclerosis 03/06/2011   Healthcare maintenance 03/06/2011   Hyperlipidemia 09/17/2010    ONSET DATE: Referral date 10-17-23  REFERRING DIAG: M25.562,G89.29 (ICD-10-CM) -  Chronic pain of left knee G35.D (ICD-10-CM) - Multiple sclerosis  THERAPY DIAG:  Other abnormalities of gait and mobility  Muscle weakness (generalized)  Other symptoms and signs involving the nervous system  Unsteadiness on feet  Rationale for Evaluation and Treatment: Rehabilitation  SUBJECTIVE:                                                                                                                                                                                             SUBJECTIVE STATEMENT: Pt presents for PT eval after OT eval,  accompanied by her daugther.  Pt is in manual wheelchair which she uses only for community mobility - is dependent for propulsion.  Pt had PT at this facility from Feb. 2022 to May 2022. Daughter states pt had carbon fiber AFO years ago but did not wear it.  Pt uses RW at home to amb. In her bedroom and to bathroom. Pt accompanied by: family member - daughter, [name]  PERTINENT HISTORY:  MS since 1999 as well as history of depression, LE edema, osteoarthritis (B knees)   Per chart note; Currently, gait is worse and she stopped using a walker ater a fall.   She felt she had some benefit from PT in the past but has not done over last year.  She can transfer independently most of the time but sometimes needs assistance.    She has left > right leg weakness.  She notes mild hand weakness.  Pressing buttons is more difficult.    She has left leg stiffness/spasticity as well.   She has numbness in her left hand and both legs.  She did not try the gabapentin .      She has some urinary incontinence so wears Depends.     She only gets a sensation a few seconds before she needs to go.    She has had notable dysconjugate gaze since 2017 or so.   Her left eye has very poor vision so does not note diplopia. .  Visual symptoms OS started before the MS diagnosis but were mild until more recently.    PAIN:  Are you having pain? No  PRECAUTIONS:  Fall  RED FLAGS: None   WEIGHT BEARING RESTRICTIONS: No  FALLS: Has patient fallen in last 6 months? Yes. Number of falls 2 - used the cover from the bed to pull herself up   LIVING ENVIRONMENT: Lives with: lives with their family and (daughter and her boyfriend) Lives in: Other townhome Stairs: No - small threshold only Has following equipment at home: Environmental Consultant - 2 wheeled, Environmental Consultant - 4 wheeled, Wheelchair (manual), Tour manager, and Grab bars  PLOF: Independent with basic ADLs, Independent with household mobility with device, Independent with transfers, Requires assistive device for independence, and Needs assistance with homemaking  PATIENT GOALS: be able to transfer from supine to sitting; improve transfers and walking   OBJECTIVE:  Note: Objective measures were completed at Evaluation unless otherwise noted.  DIAGNOSTIC FINDINGS: None since Aug. 2022 (MRI)  COGNITION: Overall cognitive status: Within functional limits for tasks assessed   SENSATION: Not tested  COORDINATION: Decreased LLE due to minimal AROM due to weakness   MUSCLE TONE: LLE: Moderate extensor tone   POSTURE: rounded shoulders, forward head, and increased thoracic kyphosis  LOWER EXTREMITY ROM:   RLE WFL's;  minimal AROM LLE    LOWER EXTREMITY MMT:    MMT Right Eval Left Eval  Hip flexion 3- 2-  Hip extension 2+ - 3-/5 2 - 2+/5   Hip abduction    Hip adduction    Hip internal rotation    Hip external rotation    Knee flexion 3 2-  Knee extension 4 3+  Ankle dorsiflexion 3+ 2-  Ankle plantarflexion    Ankle inversion    Ankle eversion    (Blank rows = not tested)  BED MOBILITY:  Findings: Sit to supine Min A Supine to sit Min A Rolling to Right Min A  TRANSFERS: Sit to stand: CGA  Assistive device utilized: Environmental Consultant - 2 wheeled  Stand to sit: CGA  Assistive device utilized: Environmental Consultant - 2 wheeled     Needs cues for correct hand placement RAMP:  Not tested  CURB:  Not  tested  STAIRS: Not tested GAIT: Findings: Gait Characteristics: step through pattern, decreased step length- Left, decreased hip/knee flexion- Left, decreased ankle dorsiflexion- Left, and genu recurvatum- Left, Distance walked: 15', Assistive device utilized:Walker - 2 wheeled, Level of assistance: Min A and Mod A, and Comments: mod assist with fatigue15';  severe hyperextension Lt knee in stance - needs custom molded AFO   FUNCTIONAL TESTS:  Pt unable to stand unsupported                                                                                                                               TREATMENT DATE: 12-09-23    PATIENT EDUCATION: Education details: discussed eval findings and ELOS: discussed correct and safe way to transfer into bed as pt is currently crawling into bed on hands & knees:  pt needs custom molded AFO for LLE due to severe genu recurvatum Lt knee in stance Person educated: Patient and Child(ren) Education method: Explanation Education comprehension: verbalized understanding  HOME EXERCISE PROGRAM: To be established  GOALS: Goals reviewed with patient? Yes  SHORT TERM GOALS: Target date: 01-16-24 (pushed out 1 week to allow for scheduling delays)  Pt will transfer sit to stand with bil. UE support from mat and will stand for 2 at counter with UE support with CGA/SBA for increased independence with ADL's. Baseline: CGA - verbal cues needed for correct hand placement Goal status: INITIAL  2.  Pt will transfer sit to supine with CGA for LE management. Baseline:  Goal status: INITIAL  3.  Initiate process to obtain custom AFO for LLE.  Baseline:  Goal status: INITIAL  4.  Pt will amb. 12' with RW with +1 min assist for increased household accessibility.  Baseline:  Goal status: INITIAL  5.  Caregiver will verbalize understanding of assisting pt in HEP consisting of stretching, strengthening, and ROM for bil. LE's. Baseline:  Goal status:  INITIAL   LONG TERM GOALS: Target date: 02-13-23  Pt will be modified independent with bed mobility including sit to/from supine and rolling to Rt and Lt sides.  Baseline:  Goal status: INITIAL  2.  Modified independent with transfers with pt demonstrating correct hand placement without any cues.  Baseline:  Goal status: INITIAL  3.  Pt will be able to stand at counter/sink for at least 3 with supervision for increased independence with self care & ADL's.  Baseline:  Goal status: INITIAL  4.  Pt will don/doff custom AFO (if obtained) with min assist. Baseline:  Goal status: INITIAL  5.  Pt will amb. 42' with RW with custom AFO on LLE for increased household ambulation. Baseline:  Goal status: INITIAL  6.  Independent in instructing caregiver in HEP as needed for LE strengthening and stretching.  Baseline:  Goal status: INITIAL  ASSESSMENT:  CLINICAL IMPRESSION: Patient is a 63 y.o. lady who was seen today for physical therapy evaluation and treatment for LE weakness, gait and balance impairments due to MS.  Pt is currently able to amb. With RW approx. 15' with min to mod assist (increased assistance needed to advance LLE with fatigue).  Pt has severe Lt genu recurvatum in stance and needs a custom molded AFO for increased safety with gait.  Pt has minimal AROM and strength in Lt hip, knee and ankle musc. (> strength is in Lt quads at 3+/5).  Pt is currently unable to stand unsupported and requires bil. UE support for sit to stand transfers; sitting balance is WNL's.  Unable to assess gait velocity and TUG due to severity of gait impairments.  Pt will benefit from PT to address functional mobility including transfers, bed mobility, balance and gait.    OBJECTIVE IMPAIRMENTS: Abnormal gait, decreased activity tolerance, decreased balance, decreased endurance, decreased mobility, decreased ROM, decreased strength, impaired tone, and impaired UE functional use.   ACTIVITY LIMITATIONS:  carrying, lifting, bending, standing, squatting, stairs, transfers, bed mobility, bathing, reach over head, hygiene/grooming, and locomotion level  PARTICIPATION LIMITATIONS: meal prep, cleaning, laundry, shopping, community activity, and pt does not drive  PERSONAL FACTORS: Age, Fitness, Time since onset of injury/illness/exacerbation, and 1-2 comorbidities: MS  are also affecting patient's functional outcome.   REHAB POTENTIAL: Good - due to chronicity of deficits  CLINICAL DECISION MAKING: Evolving/moderate complexity  EVALUATION COMPLEXITY: Moderate  PLAN:  PT FREQUENCY: 2x/week  PT DURATION: 8 weeks + eval  PLANNED INTERVENTIONS: 97110-Therapeutic exercises, 97530- Therapeutic activity, [id number]- Neuromuscular re-education, [phone]- Self Care, 02883- Gait training, [phone]- Orthotic Initial, [phone]- Orthotic/Prosthetic subsequent, and Patient/Family education  PLAN FOR NEXT SESSION: initiate process for AFO; issue HEP: try Swedish knee cage and AFO with gait training   Crit Obremski, [place], PT 12/10/2023, 3:27 PM

## 2023-12-10 ENCOUNTER — Encounter: Payer: Self-pay | Admitting: Physical Therapy

## 2023-12-15 ENCOUNTER — Telehealth: Payer: Self-pay | Admitting: Physical Therapy

## 2023-12-15 DIAGNOSIS — M6281 Muscle weakness (generalized): Secondary | ICD-10-CM

## 2023-12-15 NOTE — Telephone Encounter (Signed)
 Hi [name], Kristen Boyle is being treated by physical therapy for LE weakness and gait impairments due to MS.  [name] will benefit from use of Lt custom molded AFO in order to improve safety with functional mobility.    If you agree, please submit request in EPIC under MD Order, Other Orders (list Lt custom molded AFO in comments) or fax to [facility] at [phone].   Thank you, Elvie Kussmaul, PT   [facility] [address] [place], [place]  [address] Phone:  [phone] Fax:  [phone]

## 2023-12-16 NOTE — Addendum Note (Signed)
 Addended by: Malary Aylesworth on: 12/16/2023 01:03 PM   Modules accepted: Orders

## 2023-12-23 ENCOUNTER — Ambulatory Visit: Admitting: Physical Therapy

## 2023-12-25 ENCOUNTER — Ambulatory Visit: Admitting: Physical Therapy

## 2023-12-30 ENCOUNTER — Ambulatory Visit: Admitting: Physical Therapy

## 2024-01-01 ENCOUNTER — Ambulatory Visit: Admitting: Physical Therapy

## 2024-01-01 ENCOUNTER — Ambulatory Visit: Admitting: Occupational Therapy

## 2024-01-06 ENCOUNTER — Ambulatory Visit: Admitting: Occupational Therapy

## 2024-01-08 ENCOUNTER — Ambulatory Visit: Attending: Student | Admitting: Physical Therapy

## 2024-01-08 DIAGNOSIS — R2681 Unsteadiness on feet: Secondary | ICD-10-CM | POA: Diagnosis present

## 2024-01-08 DIAGNOSIS — M6281 Muscle weakness (generalized): Secondary | ICD-10-CM | POA: Diagnosis present

## 2024-01-08 DIAGNOSIS — Z741 Need for assistance with personal care: Secondary | ICD-10-CM | POA: Diagnosis present

## 2024-01-08 DIAGNOSIS — R278 Other lack of coordination: Secondary | ICD-10-CM | POA: Diagnosis present

## 2024-01-08 DIAGNOSIS — R29818 Other symptoms and signs involving the nervous system: Secondary | ICD-10-CM | POA: Diagnosis present

## 2024-01-08 DIAGNOSIS — R2689 Other abnormalities of gait and mobility: Secondary | ICD-10-CM | POA: Insufficient documentation

## 2024-01-08 NOTE — Therapy (Unsigned)
 OUTPATIENT PHYSICAL THERAPY NEURO EVALUATION   Patient Name: Kristen Boyle MRN: [id number] DOB:[birth year], 63 y.o., female Today's Date: 01/08/2024   PCP: Bernadine Manos, MD REFERRING PROVIDER: Emiliano Leonce CROME, PA-C  END OF SESSION:    Past Medical History:  Diagnosis Date   Abnormality of gait 06/10/2012   Depression    Headache    Hyperlipidemia    MS (multiple sclerosis)    Past Surgical History:  Procedure Laterality Date   CHOLECYSTECTOMY N/A 06/20/2015   Procedure: LAPAROSCOPIC CHOLECYSTECTOMY;  Surgeon: Lynda Leos, MD;  Location: [facility];  Service: General;  Laterality: N/A;   DENTAL SURGERY     TUBAL LIGATION Bilateral    Patient Active Problem List   Diagnosis Date Noted   Chronic left shoulder pain 04/17/2023   Acute pain of left foot 04/17/2023   Osteoarthritis of left knee 03/13/2022   Elevated serum creatinine 10/04/2021   Right wrist pain 07/19/2020   Hot flashes due to menopause 12/01/2019   Encounter for therapeutic drug monitoring 04/12/2016   Constipation 04/12/2016   Vaginal atrophy 04/12/2016   Bilateral lower extremity edema with recent DOE 11/02/2015   Microcytosis 10/16/2014   Vitamin D  insufficiency 04/20/2014   Abnormality of gait 09/13/2013   Depression 09/19/2011   Multiple sclerosis 03/06/2011   Healthcare maintenance 03/06/2011   Hyperlipidemia 09/17/2010    ONSET DATE: Referral date 10-17-23  REFERRING DIAG: M25.562,G89.29 (ICD-10-CM) - Chronic pain of left knee G35.D (ICD-10-CM) - Multiple sclerosis  THERAPY DIAG:  No diagnosis found.  Rationale for Evaluation and Treatment: Rehabilitation  SUBJECTIVE:                                                                                                                                                                                             SUBJECTIVE STATEMENT: Pt presents for PT eval after OT eval, accompanied by her daugther.  Pt is in manual wheelchair which  she uses only for community mobility - is dependent for propulsion.  Pt had PT at this facility from Feb. 2022 to May 2022. Daughter states pt had carbon fiber AFO years ago but did not wear it.  Pt uses RW at home to amb. In her bedroom and to bathroom. Pt accompanied by: family member - daughter, [name]  PERTINENT HISTORY:  MS since 1999 as well as history of depression, LE edema, osteoarthritis (B knees)   Per chart note; Currently, gait is worse and she stopped using a walker ater a fall.   She felt she had some benefit from PT in the past but has not done over last year.  She can transfer  independently most of the time but sometimes needs assistance.    She has left > right leg weakness.  She notes mild hand weakness.  Pressing buttons is more difficult.    She has left leg stiffness/spasticity as well.   She has numbness in her left hand and both legs.  She did not try the gabapentin .      She has some urinary incontinence so wears Depends.     She only gets a sensation a few seconds before she needs to go.    She has had notable dysconjugate gaze since 2017 or so.   Her left eye has very poor vision so does not note diplopia. .  Visual symptoms OS started before the MS diagnosis but were mild until more recently.    PAIN:  Are you having pain? No  PRECAUTIONS: Fall  RED FLAGS: None   WEIGHT BEARING RESTRICTIONS: No  FALLS: Has patient fallen in last 6 months? Yes. Number of falls 2 - used the cover from the bed to pull herself up   LIVING ENVIRONMENT: Lives with: lives with their family and (daughter and her boyfriend) Lives in: Other townhome Stairs: No - small threshold only Has following equipment at home: Environmental Consultant - 2 wheeled, Environmental Consultant - 4 wheeled, Wheelchair (manual), Tour manager, and Grab bars  PLOF: Independent with basic ADLs, Independent with household mobility with device, Independent with transfers, Requires assistive device for independence, and Needs assistance  with homemaking  PATIENT GOALS: be able to transfer from supine to sitting; improve transfers and walking   OBJECTIVE:  Note: Objective measures were completed at Evaluation unless otherwise noted.  DIAGNOSTIC FINDINGS: None since Aug. 2022 (MRI)  COGNITION: Overall cognitive status: Within functional limits for tasks assessed   SENSATION: Not tested  COORDINATION: Decreased LLE due to minimal AROM due to weakness   MUSCLE TONE: LLE: Moderate extensor tone   POSTURE: rounded shoulders, forward head, and increased thoracic kyphosis  LOWER EXTREMITY ROM:   RLE WFL's;  minimal AROM LLE    LOWER EXTREMITY MMT:    MMT Right Eval Left Eval  Hip flexion 3- 2-  Hip extension 2+ - 3-/5 2 - 2+/5   Hip abduction    Hip adduction    Hip internal rotation    Hip external rotation    Knee flexion 3 2-  Knee extension 4 3+  Ankle dorsiflexion 3+ 2-  Ankle plantarflexion    Ankle inversion    Ankle eversion    (Blank rows = not tested)  BED MOBILITY:  Findings: Sit to supine Min A Supine to sit Min A Rolling to Right Min A  TRANSFERS: Sit to stand: CGA  Assistive device utilized: Environmental Consultant - 2 wheeled     Stand to sit: CGA  Assistive device utilized: Environmental Consultant - 2 wheeled     Needs cues for correct hand placement RAMP:  Not tested  CURB:  Not tested  STAIRS: Not tested GAIT: Findings: Gait Characteristics: step through pattern, decreased step length- Left, decreased hip/knee flexion- Left, decreased ankle dorsiflexion- Left, and genu recurvatum- Left, Distance walked: 15', Assistive device utilized:Walker - 2 wheeled, Level of assistance: Min A and Mod A, and Comments: mod assist with fatigue15';  severe hyperextension Lt knee in stance - needs custom molded AFO   FUNCTIONAL TESTS:  Pt unable to stand unsupported  TREATMENT DATE: 12-09-23     PATIENT EDUCATION: Education details: discussed eval findings and ELOS: discussed correct and safe way to transfer into bed as pt is currently crawling into bed on hands & knees:  pt needs custom molded AFO for LLE due to severe genu recurvatum Lt knee in stance Person educated: Patient and Child(ren) Education method: Explanation Education comprehension: verbalized understanding  HOME EXERCISE PROGRAM: To be established  GOALS: Goals reviewed with patient? Yes  SHORT TERM GOALS: Target date: 01-16-24 (pushed out 1 week to allow for scheduling delays)  Pt will transfer sit to stand with bil. UE support from mat and will stand for 2 at counter with UE support with CGA/SBA for increased independence with ADL's. Baseline: CGA - verbal cues needed for correct hand placement Goal status: INITIAL  2.  Pt will transfer sit to supine with CGA for LE management. Baseline:  Goal status: INITIAL  3.  Initiate process to obtain custom AFO for LLE.  Baseline:  Goal status: INITIAL  4.  Pt will amb. 44' with RW with +1 min assist for increased household accessibility.  Baseline:  Goal status: INITIAL  5.  Caregiver will verbalize understanding of assisting pt in HEP consisting of stretching, strengthening, and ROM for bil. LE's. Baseline:  Goal status: INITIAL   LONG TERM GOALS: Target date: 02-13-23  Pt will be modified independent with bed mobility including sit to/from supine and rolling to Rt and Lt sides.  Baseline:  Goal status: INITIAL  2.  Modified independent with transfers with pt demonstrating correct hand placement without any cues.  Baseline:  Goal status: INITIAL  3.  Pt will be able to stand at counter/sink for at least 3 with supervision for increased independence with self care & ADL's.  Baseline:  Goal status: INITIAL  4.  Pt will don/doff custom AFO (if obtained) with min assist. Baseline:  Goal status: INITIAL  5.  Pt will amb. 21' with RW with  custom AFO on LLE for increased household ambulation. Baseline:  Goal status: INITIAL  6.  Independent in instructing caregiver in HEP as needed for LE strengthening and stretching.  Baseline:  Goal status: INITIAL  ASSESSMENT:  CLINICAL IMPRESSION: Patient is a 63 y.o. lady who was seen today for physical therapy evaluation and treatment for LE weakness, gait and balance impairments due to MS.  Pt is currently able to amb. With RW approx. 15' with min to mod assist (increased assistance needed to advance LLE with fatigue).  Pt has severe Lt genu recurvatum in stance and needs a custom molded AFO for increased safety with gait.  Pt has minimal AROM and strength in Lt hip, knee and ankle musc. (> strength is in Lt quads at 3+/5).  Pt is currently unable to stand unsupported and requires bil. UE support for sit to stand transfers; sitting balance is WNL's.  Unable to assess gait velocity and TUG due to severity of gait impairments.  Pt will benefit from PT to address functional mobility including transfers, bed mobility, balance and gait.    OBJECTIVE IMPAIRMENTS: Abnormal gait, decreased activity tolerance, decreased balance, decreased endurance, decreased mobility, decreased ROM, decreased strength, impaired tone, and impaired UE functional use.   ACTIVITY LIMITATIONS: carrying, lifting, bending, standing, squatting, stairs, transfers, bed mobility, bathing, reach over head, hygiene/grooming, and locomotion level  PARTICIPATION LIMITATIONS: meal prep, cleaning, laundry, shopping, community activity, and pt does not drive  PERSONAL FACTORS: Age, Fitness, Time since onset of injury/illness/exacerbation, and 1-2 comorbidities:  MS  are also affecting patient's functional outcome.   REHAB POTENTIAL: Good - due to chronicity of deficits  CLINICAL DECISION MAKING: Evolving/moderate complexity  EVALUATION COMPLEXITY: Moderate  PLAN:  PT FREQUENCY: 2x/week  PT DURATION: 8 weeks +  eval  PLANNED INTERVENTIONS: 97110-Therapeutic exercises, 97530- Therapeutic activity, [id number]- Neuromuscular re-education, [phone]- Self Care, 02883- Gait training, [phone]- Orthotic Initial, [phone]- Orthotic/Prosthetic subsequent, and Patient/Family education  PLAN FOR NEXT SESSION: initiate process for AFO; issue HEP: try Swedish knee cage and AFO with gait training   Felder Lebeda, [place], PT 01/08/2024, 11:11 AM

## 2024-01-09 ENCOUNTER — Encounter: Payer: Self-pay | Admitting: Physical Therapy

## 2024-01-12 ENCOUNTER — Ambulatory Visit: Admitting: Occupational Therapy

## 2024-01-12 DIAGNOSIS — M6281 Muscle weakness (generalized): Secondary | ICD-10-CM | POA: Diagnosis not present

## 2024-01-12 DIAGNOSIS — R278 Other lack of coordination: Secondary | ICD-10-CM

## 2024-01-12 DIAGNOSIS — R29818 Other symptoms and signs involving the nervous system: Secondary | ICD-10-CM

## 2024-01-12 NOTE — Therapy (Signed)
 " OUTPATIENT OCCUPATIONAL THERAPY NEURO TREATMENT  Patient Name: Kristen Boyle MRN: [id number] DOB:[birth year], 63 y.o., female Today's Date: 01/12/2024  PCP: Bernadine Manos, MD REFERRING PROVIDER: Lovie Clarity, MD  END OF SESSION:  OT End of Session - 01/12/24 1400     Visit Number 2    Number of Visits 20    Date for Recertification  03/19/24    Authorization Type Medicare A&B/Medicaid 2025    OT Start Time 1400    OT Stop Time 1500    OT Time Calculation (min) 60 min    Equipment Utilized During Treatment Testing Material & Yellow Putty    Activity Tolerance Patient tolerated treatment well    Behavior During Therapy [facility] for tasks assessed/performed          Past Medical History:  Diagnosis Date   Abnormality of gait 06/10/2012   Depression    Headache    Hyperlipidemia    MS (multiple sclerosis)    Past Surgical History:  Procedure Laterality Date   CHOLECYSTECTOMY N/A 06/20/2015   Procedure: LAPAROSCOPIC CHOLECYSTECTOMY;  Surgeon: Lynda Leos, MD;  Location: [facility];  Service: General;  Laterality: N/A;   DENTAL SURGERY     TUBAL LIGATION Bilateral    Patient Active Problem List   Diagnosis Date Noted   Chronic left shoulder pain 04/17/2023   Acute pain of left foot 04/17/2023   Osteoarthritis of left knee 03/13/2022   Elevated serum creatinine 10/04/2021   Right wrist pain 07/19/2020   Hot flashes due to menopause 12/01/2019   Encounter for therapeutic drug monitoring 04/12/2016   Constipation 04/12/2016   Vaginal atrophy 04/12/2016   Bilateral lower extremity edema with recent DOE 11/02/2015   Microcytosis 10/16/2014   Vitamin D  insufficiency 04/20/2014   Abnormality of gait 09/13/2013   Depression 09/19/2011   Multiple sclerosis 03/06/2011   Healthcare maintenance 03/06/2011   Hyperlipidemia 09/17/2010    ONSET DATE: Referral: 11/04/2023  REFERRING DIAG: G35.D (ICD-10-CM) - Multiple sclerosis  THERAPY DIAG:  Muscle weakness  (generalized)  Other lack of coordination  Other symptoms and signs involving the nervous system  Rationale for Evaluation and Treatment: Rehabilitation  SUBJECTIVE:   SUBJECTIVE STATEMENT: Pt arrived with her daughter today for her first appt since evaluation.  When asked about concerns, pt's daughter reported concerns about getting to/from the bathroom in time. .   Pt accompanied by: self and family member - daughter - Agricultural Consultant (flight attendant)  PERTINENT HISTORY: MS since 2013 as well as history of depression, LE edema, osteoarthritis (B knees)  PRECAUTIONS: Fall  WEIGHT BEARING RESTRICTIONS: No  PAIN:  Are you having pain? No   FALLS: Has patient fallen in last 6 months? Yes. Number of falls Has fallen trying to get up into the bed, and trying to get to the bathroom.   LIVING ENVIRONMENT: Lives with: lives with their family - daughter & boyfriend Lives in: House/apartment - townhome Stairs: No Has following equipment at home: Single point cane, Environmental Consultant - 2 wheeled, Environmental Consultant - 4 wheeled, Wheelchair (manual), Tour manager, and Grab bars  PLOF: Independent with basic ADLs and Needs assistance with homemaking  PATIENT GOALS: Pt reported get up when I fall and walk faster to get to the bathroom.  OBJECTIVE:  Note: Objective measures were completed at Evaluation unless otherwise noted.  HAND DOMINANCE: Right  ADLs:   Pt's daughter reports she will help pt due to her low energy and they have no assistance coming to home.  Pt can  do things herself but daughter helps so she has energy for the rest of the day.   Daughter may be gone over night due to job as a financial controller.  Pt tends to stay in her room where she has a refrigerator but she does have to walk to the bathroom, just outside the bedroom door and pt does so while holding onto furniture.   Overall ADLs: Mod I for basic ADLs but gets assistance when available and has history of falls Transfers/ambulation  related to ADLs: Can get OOB on her own but still has trouble sometimes due to weak core.  Pt's daughter helps her back to bed Eating: Ind Grooming: Daughter combs hair, Pt rinses her mouth herself UB Dressing: Pt can do this on her own. LB Dressing: Pt reports changing her own pull-up s/p incontinence  Toileting: Has accidents of B&B and changes her own pull up.  Accidents of BM occur mostly if she has to take something for constipation Bathing: Pt reports she will get in the shower and clean herself if she has an accident. Tub Shower transfers: Pt holds onto furniture and walk to get to the bathroom.  Pt reports that upon return from the bathroom she usually has to sit in her chair a little bit and then she works to get back in the bed - ie) It takes me awhile.  Daughter reports that is is hard for pt to lift her legs high enoguh to get them in the bed Equipment: Transfer tub bench  IADLs: Shopping: Daughter Light housekeeping: Daughter, Pt reports she will help fold some laundry Meal Prep: Has refrigerator in her bedroom, and will get foods like yogurt, fruit, salads etc (no microwave), when her daughter is not home. Community mobility: Assisted with WC Medication management: Pt takes meds from pill bottles Financial management: TBA Handwriting: TBA  MOBILITY STATUS: Needs Assist: furniture walks at home (walker does not fit through the doorways), Hx of falls, and difficulty carrying objections with ambulation  POSTURE COMMENTS:  rounded shoulders and forward head Sitting balance: No issues while seated in WC at eval  ACTIVITY TOLERANCE: Activity tolerance: Limited - prefers the bed  FUNCTIONAL OUTCOME MEASURES: TBA - PSFS  Modified Barthel Index of ADLS ~ Modified Barthel Index Anastacio et al.) = 59 points Interpretation: 21-60 Severe dependency  UPPER EXTREMITY ROM:    Active ROM - TBA Right eval Left eval  Shoulder flexion    Shoulder abduction    Shoulder adduction     Shoulder extension    Shoulder internal rotation    Shoulder external rotation    Elbow flexion    Elbow extension    Wrist flexion    Wrist extension    Wrist ulnar deviation    Wrist radial deviation    Wrist pronation    Wrist supination    (Blank rows = not tested)  UPPER EXTREMITY MMT:     MMT - TBA Right eval Left eval  Shoulder flexion    Shoulder abduction    Shoulder adduction    Shoulder extension    Shoulder internal rotation    Shoulder external rotation    Middle trapezius    Lower trapezius    Elbow flexion    Elbow extension    Wrist flexion    Wrist extension    Wrist ulnar deviation    Wrist radial deviation    Wrist pronation    Wrist supination    (Blank rows = not tested)  HAND FUNCTION: Grip strength: Right: 7.7, 9.0  lbs; Left: 11.9, 8.1 lbs  COORDINATION: 01/12/24 9 Hole Peg test: Right: 47.28  sec; Left: 45.20  sec Box and Blocks:  Right 29 blocks, Left 31 blocks R suppoorted on edge of Box & blocks test box verus pt lifted her L arm to complete the same test  SENSATION: TBA  EDEMA: NA  MUSCLE TONE: WFL  COGNITION: Overall cognitive status: Impaired - pt deferred to daughter to answer questions and indicated assistance needed with written material  VISION: Subjective report: Limited by time at eval Baseline vision: TBA Visual history: TBA  VISION ASSESSMENT: Not tested  Patient has difficulty with following activities due to following visual impairments: None reported at eval  EVAL OBSERVATIONS: Pt arrived with daughter in manual WC.  The pt appears well kept and has jacket donned.  Pt has limitations with end range ROM and is weak in BUEs affecting safety with ADLs.  She often defers to her daughter to answer questions and admits to falls but unable to correctly identify need to keep her phone with her at all times for safety.                                                                                                                               TODAY'S TREATMENT:    - Self Care education and training completed for duration as noted below including: OT provided education to patient and family regarding the use of scheduled toileting to improve continence, safety, and energy conservation related to MS. Education emphasized initiating toileting at planned intervals rather than waiting for urgency, in order to reduce stress, rushing, and risk of accidents or falls. Discussed identifying consistent time patterns, using reminders or alarms, and pairing toileting with existing routines (e.g., upon waking, before meals, before leaving home). Education also included allowing adequate time, avoiding rushing, and using adaptive strategies or supports as needed to promote success. Patient and family verbalized understanding and agreed to trial scheduled toileting for improved management and reduced urgency-related stress.  - Therapeutic exercises and activities completed for duration as noted below including:   Therapist assessed coordination with patient today due to limited time at evaluation. 9 Hole Peg test: Right: 47.28  sec; Left: 45.20  sec Box and Blocks:  Right 29 blocks, Left 31 blocks  Initiated Putty Exercises with yellow putty to begin strengthening, coordination and sensory stimulation of B UEs.  Patient provided visual demonstration, verbal and tactile cues as needed to improve performance of the various exercises/activities including:   - Putty Squeezes - cues to squeeze putty into log for use with other exercises and to fold putty in half with 1 hand or roll it up into a cinnamon roll shape with 1 hand at a time  - Putty Rolls - encourage to roll putty into logs with sensory stimulation to entire length of hand, fingers and wrist as needed   - Pinch and  Pull with Putty - this motion is combined with different pinches (3-Point Pinch, Tip Pinch, Key Pinch) - patient encouraged to combine tripod, pincer and/or  key pinch with pinch and pull motion of putty pulling away from midline, changing between different pinches and changing different directions to change grip  - Finger Extension with Putty - pt shown how to work on task with all fingers and thumb as well as individual fingers in opposition to thumb  - Finger Adduction with Putty - pt shown how to work on weaving putty between fingers/thumb and then squeeze fingers together while laying hand flat on table top.  - Removing Objects from Putty  - encouraged to hide items (coins, marble, dice etc) and use one hand at a time to find the objects and identify them by tactile input before she digs them out and can see them visually.  OT educated patient on theraputty recommendations: avoid small children, pets, hot environments, place in designated container and avoid contact with fabrics. Patient verbalized understanding.    Patient benefited from extra time, verbal/tactile cues, and modeling of task to allow time for processing of verbal instructions and improve motor planning of unfamiliar movements.   PATIENT EDUCATION: Education details: Putty HEP Person educated: Patient and Child(ren) Education method: Explanation, Demonstration, Actor cues, Verbal cues, and Handouts Education comprehension: verbalized understanding, returned demonstration, verbal cues required, tactile cues required, and needs further education  HOME EXERCISE PROGRAM: 01/12/24: Putty HEP Access Code: [email or web address]    GOALS: Goals reviewed with patient? Yes  SHORT TERM GOALS: Target date: 01/21/24  Patient will demonstrate initial B UE HEP with 25% verbal cues or less for proper execution. Baseline: New to outpt OT s/p MS dx 2013 Goal status: IN Progress  2.  Pt will participate in UE strengthening and functional tasks to improve grip strength by at least 2-3 lbs bilaterally to support safe device use and transfers. Baseline: Right: 7.7, 9.0  lbs; Left: 11.9, 8.1  lbs Goal status: IN Progress  3.  Patient will demo improved FM coordination as evidenced by completing nine-hole peg with use of R/L UE in as close to 40 seconds as possible.  Baseline: 01/12/24 9 Hole Peg test: Right: 47.28  sec; Left: 45.20  sec Box and Blocks:  Right 29 blocks, Left 31 blocks Goal status: IN Progress  4.  Pt/daughter will verbalize understanding of adapted strategies and/or equipment PRN to increase safety and independence with ADLs and IADLs (I.e. with decreased pain and joint protection).             Baseline: New to outpt OT             Goal Status: IN Progress  5.  Pt will complete toileting routine (transfer, hygiene, return to bed) with no episodes of instability or near-fall, utilizing energy conservation and appropriate DME as trained. Baseline: History of falls Goal status: IN Progress   LONG TERM GOALS: Target date: 03/20/24  Patient will demonstrate updated B UE HEP with visual handouts only for proper execution to improve strength and coordination.  Baseline: New to outpt OT s/p MS dx 2013; Grip strength: Right: 7.7, 9.0  lbs; Left: 11.9, 8.1 lbs Goal status: INITIAL  2.  Pt will implement use of 3-4 energy conservation strategies (task modification, planned rest breaks, environmental setup, adaptive tools) to prevent exhaustion and reduce reliance on her daughter for ADLs. Baseline: New to outpt OT s/p MS dx 2013 Goal status: INITIAL  3.  Pt will use  a memory strategies (phone reminders, notebook, wall calendar) to track medications, appointments and safety recommendations r/t AE and phone use when alone with 90% consistency Baseline: New to outpt OT  Goal status: INITIAL  4.  Patient will report at least two-point increase in average PSFS score or at least three-point increase in a single activity score indicating functionally significant improvement given minimum detectable change. Baseline: TBA total score (See above for individual activity  scores) Goal status: INITIAL   ASSESSMENT:  CLINICAL IMPRESSION: Patient is a 63 y.o. female who was seen today for occupational therapy treatment for Multiple Sclerosis. Pt/daughter responded well to scheduled toileting education with plans to address transfer at next session this week. Pt also performed putty HEP well today with education and guidance to work on strength and coordination tasks. Pt would benefit from skilled OT services in the outpatient setting to work on impairments as noted below to help pt return to PLOF as able.    PERFORMANCE DEFICITS: in functional skills including ADLs, IADLs, coordination, dexterity, ROM, strength, pain, flexibility, Fine motor control, Gross motor control, mobility, balance, endurance, continence, decreased knowledge of precautions, decreased knowledge of use of DME, and UE functional use, cognitive skills including learn, memory, problem solving, safety awareness, thought, and understand, and psychosocial skills including coping strategies, environmental adaptation, habits, interpersonal interactions, and routines and behaviors.   IMPAIRMENTS: are limiting patient from ADLs, IADLs, rest and sleep, leisure, and social participation.   CO-MORBIDITIES: may have co-morbidities  that affects occupational performance. Patient will benefit from skilled OT to address above impairments and improve overall function.  REHAB POTENTIAL: Fair Duration of dx  PLAN:  OT FREQUENCY: 1-2x/week  OT DURATION: 12 weeks - POC extended for scheduled purposes  PLANNED INTERVENTIONS: 97168 OT Re-evaluation, 97535 self care/ADL training, 02889 therapeutic exercise, 97530 therapeutic activity, 97112 neuromuscular re-education, 97140 manual therapy, 97113 aquatic therapy, [id number] Cognitive training (first 15 min), 02869 Cognitive training(each additional 15 min), 02457 Wheelchair Management, 97760 Orthotic Initial, [id number] Orthotic/Prosthetic subsequent, balance training,  functional mobility training, visual/perceptual remediation/compensation, psychosocial skills training, energy conservation, coping strategies training, patient/family education, and DME and/or AE instructions  RECOMMENDED OTHER SERVICES: May need dental consult and potentially ST services  CONSULTED AND AGREED WITH PLAN OF CARE: Patient and family member/caregiver  PLAN FOR NEXT SESSION:  Assess ROM/vision & PSFS HEPs - putty (12/22), coordination etc AE considerations ADL comp training  Review specific goals  Clarita LITTIE Pride, OT 01/12/2024, 3:08 PM           "

## 2024-01-12 NOTE — Patient Instructions (Signed)
 Access Code: [email or web address] URL: [email or web address] Date: 01/12/2024 Prepared by: Clarita Pride  Exercises - Putty Squeezes  - 1-2 x daily - 10 reps - Rolling Putty on Table  - 1-2 x daily - 10 reps - Finger Pinch and Pull with Putty  - 1-2 x daily - 10 reps - 3-Point Pinch with Putty  - 1-2 x daily - 10 reps - Tip PUSH with Putty  - 1-2 x daily - 10 reps - Key Pinch with Putty  - 1-2 x daily - 10 reps - Finger Extension with Putty  - 1-2 x daily - 10 reps - Finger Adduction with Putty  - 1-2 x daily - 10 reps - Removing Marbles from Putty  - 1-2 x daily - 10 reps

## 2024-01-14 ENCOUNTER — Ambulatory Visit: Admitting: Occupational Therapy

## 2024-01-19 ENCOUNTER — Ambulatory Visit: Admitting: Physical Therapy

## 2024-01-19 ENCOUNTER — Encounter: Payer: Self-pay | Admitting: Physical Therapy

## 2024-01-19 ENCOUNTER — Ambulatory Visit: Admitting: Occupational Therapy

## 2024-01-19 DIAGNOSIS — R2681 Unsteadiness on feet: Secondary | ICD-10-CM

## 2024-01-19 DIAGNOSIS — R2689 Other abnormalities of gait and mobility: Secondary | ICD-10-CM

## 2024-01-19 DIAGNOSIS — M6281 Muscle weakness (generalized): Secondary | ICD-10-CM

## 2024-01-19 DIAGNOSIS — R29818 Other symptoms and signs involving the nervous system: Secondary | ICD-10-CM

## 2024-01-19 DIAGNOSIS — R278 Other lack of coordination: Secondary | ICD-10-CM

## 2024-01-19 DIAGNOSIS — Z741 Need for assistance with personal care: Secondary | ICD-10-CM

## 2024-01-19 NOTE — Therapy (Signed)
 " OUTPATIENT PHYSICAL THERAPY NEURO TREATMENT NOTE   Patient Name: Kristen Boyle MRN: [id number] DOB:01-30-1960, 63 y.o., female Today's Date: 01/19/2024   PCP: Bernadine Manos, MD REFERRING PROVIDER: Emiliano Leonce CROME, PA-C  END OF SESSION:  PT End of Session - 01/19/24 1320     Visit Number 3    Number of Visits 17    Date for Recertification  02/06/24    Authorization Type Medicare    Authorization Time Period 12-09-23 - 02-21-24    Progress Note Due on Visit 10    PT Start Time 1113   pt arrived late due to need to go to restroom   PT Stop Time 1152    PT Time Calculation (min) 39 min    Equipment Utilized During Treatment Gait belt;Other (comment)   Swedish knee cage, Lt [place] AFO   Activity Tolerance Patient tolerated treatment well    Behavior During Therapy WFL for tasks assessed/performed            Past Medical History:  Diagnosis Date   Abnormality of gait 06/10/2012   Depression    Headache    Hyperlipidemia    MS (multiple sclerosis)    Past Surgical History:  Procedure Laterality Date   CHOLECYSTECTOMY N/A 06/20/2015   Procedure: LAPAROSCOPIC CHOLECYSTECTOMY;  Surgeon: Lynda Leos, MD;  Location: [facility];  Service: General;  Laterality: N/A;   DENTAL SURGERY     TUBAL LIGATION Bilateral    Patient Active Problem List   Diagnosis Date Noted   Chronic left shoulder pain 04/17/2023   Acute pain of left foot 04/17/2023   Osteoarthritis of left knee 03/13/2022   Elevated serum creatinine 10/04/2021   Right wrist pain 07/19/2020   Hot flashes due to menopause 12/01/2019   Encounter for therapeutic drug monitoring 04/12/2016   Constipation 04/12/2016   Vaginal atrophy 04/12/2016   Bilateral lower extremity edema with recent DOE 11/02/2015   Microcytosis 10/16/2014   Vitamin D  insufficiency 04/20/2014   Abnormality of gait 09/13/2013   Depression 09/19/2011   Multiple sclerosis 03/06/2011   Healthcare maintenance 03/06/2011    Hyperlipidemia 09/17/2010    ONSET DATE: Referral date 10-17-23  REFERRING DIAG: M25.562,G89.29 (ICD-10-CM) - Chronic pain of left knee G35.D (ICD-10-CM) - Multiple sclerosis  THERAPY DIAG:  Muscle weakness (generalized)  Other abnormalities of gait and mobility  Other symptoms and signs involving the nervous system  Rationale for Evaluation and Treatment: Rehabilitation  SUBJECTIVE:  SUBJECTIVE STATEMENT: Pt arrives late to scheduled PT appt due to needing to go to restroom; no problems reported by pt or daugther; doing OK Pt accompanied by: family member - daughter, [name]  PERTINENT HISTORY:  MS since 1999 as well as history of depression, LE edema, osteoarthritis (B knees)   Per chart note; Currently, gait is worse and she stopped using a walker ater a fall.   She felt she had some benefit from PT in the past but has not done over last year.  She can transfer independently most of the time but sometimes needs assistance.    She has left > right leg weakness.  She notes mild hand weakness.  Pressing buttons is more difficult.    She has left leg stiffness/spasticity as well.   She has numbness in her left hand and both legs.  She did not try the gabapentin .      She has some urinary incontinence so wears Depends.     She only gets a sensation a few seconds before she needs to go.    She has had notable dysconjugate gaze since 2017 or so.   Her left eye has very poor vision so does not note diplopia. .  Visual symptoms OS started before the MS diagnosis but were mild until more recently.    PAIN:  Are you having pain? No  PRECAUTIONS: Fall  RED FLAGS: None   WEIGHT BEARING RESTRICTIONS: No  FALLS: Has patient fallen in last 6 months? Yes. Number of falls 2 - used the cover from  the bed to pull herself up   LIVING ENVIRONMENT: Lives with: lives with their family and (daughter and her boyfriend) Lives in: Other townhome Stairs: No - small threshold only Has following equipment at home: Environmental Consultant - 2 wheeled, Environmental Consultant - 4 wheeled, Wheelchair (manual), Tour manager, and Grab bars  PLOF: Independent with basic ADLs, Independent with household mobility with device, Independent with transfers, Requires assistive device for independence, and Needs assistance with homemaking  PATIENT GOALS: be able to transfer from supine to sitting; improve transfers and walking   OBJECTIVE:  Note: Objective measures were completed at Evaluation unless otherwise noted.  DIAGNOSTIC FINDINGS: None since Aug. 2022 (MRI)  COGNITION: Overall cognitive status: Within functional limits for tasks assessed   SENSATION: Not tested  COORDINATION: Decreased LLE due to minimal AROM due to weakness   MUSCLE TONE: LLE: Moderate extensor tone   POSTURE: rounded shoulders, forward head, and increased thoracic kyphosis  LOWER EXTREMITY ROM:   RLE WFL's;  minimal AROM LLE    LOWER EXTREMITY MMT:    MMT Right Eval Left Eval  Hip flexion 3- 2-  Hip extension 2+ - 3-/5 2 - 2+/5   Hip abduction    Hip adduction    Hip internal rotation    Hip external rotation    Knee flexion 3 2-  Knee extension 4 3+  Ankle dorsiflexion 3+ 2-  Ankle plantarflexion    Ankle inversion    Ankle eversion    (Blank rows = not tested)  BED MOBILITY:  Findings: Sit to supine Min A Supine to sit Min A Rolling to Right Min A  TRANSFERS: Sit to stand: CGA  Assistive device utilized: Environmental Consultant - 2 wheeled     Stand to sit: CGA  Assistive device utilized: Environmental Consultant - 2 wheeled     Needs cues for correct hand placement RAMP:  Not tested  CURB:  Not tested  STAIRS: Not tested  GAIT: Findings: Gait Characteristics: step through pattern, decreased step length- Left, decreased hip/knee flexion- Left,  decreased ankle dorsiflexion- Left, and genu recurvatum- Left, Distance walked: 15', Assistive device utilized:Walker - 2 wheeled, Level of assistance: Min A and Mod A, and Comments: mod assist with fatigue15';  severe hyperextension Lt knee in stance - needs custom molded AFO   FUNCTIONAL TESTS:  Pt unable to stand unsupported                                                                                                                               TREATMENT DATE: 01-19-24 TherEx:   Pt transferred wheelchair to mat with CGA using squat pivot transfer (toward Rt side); transferred mat to wheelchair toward Lt side with min assist and with cues for correct hand placement at end of session - Bridging x 10 reps - unilateral bridging 10 reps LLE - Heel slide LLE 10 reps each with mod assist for flexion/ min resistance for extension - hip abdct. In hooklying 10 reps LLE - hip flexion/extension in hooklying 10 reps RLE & LLE   Gait: Toe off AFO and Swedish knee cage used on LLE - pt gait trained with RW approx. 22' x 2 reps with min to mod assist; cues to lift RLE for increased clearance; pt needs cues for correct hand placement with sit to/from stand with use of RW  Access Code: [email or web address] URL: [email or web address] Date: 01/19/2024 Prepared by: Rock Kussmaul  Exercises - Single Leg Bridge  - 1 x daily - 7 x weekly - 1 sets - 10 reps - Supine Lower Trunk Rotation  - 1 x daily - 7 x weekly - 1 sets - 2 reps - 10 hold - Supine Bridge  - 1 x daily - 7 x weekly - 1 sets - 10 reps - Hip and Knee Extension and Flexion Caregiver PROM  - 1 x daily - 7 x weekly - 1 sets - 10 reps - Clamshell  - 1 x daily - 7 x weekly - 1 sets - 10 reps - Bent Knee Fallouts  - 1 x daily - 7 x weekly - 1 sets - 10 reps   PATIENT EDUCATION: Education details:  informed pt and daughter that a face to face visit with referring MD Randene Reveal, [place]) would need to be scheduled in order to obtain the  custom Lt AFO (order for AFO has been obtained); [employer] [email or web address] Person educated: Patient and Child(ren) Education method: Explanation Education comprehension: verbalized understanding  HOME EXERCISE PROGRAM: [employer] - SEE ABOVE  GOALS: Goals reviewed with patient? Yes  SHORT TERM GOALS: Target date: 01-16-24 (pushed out 1 week to allow for scheduling delays)  Pt will transfer sit to stand with bil. UE support from mat and will stand for 2 at counter with UE support with CGA/SBA for increased independence with ADL's. Baseline: CGA - verbal cues needed for correct hand placement Goal status: INITIAL  2.  Pt will transfer sit to supine with CGA for LE management. Baseline:  Goal status: Goal met 01-19-24  3.  Initiate process to obtain custom AFO for LLE.  Baseline:  Goal status: Goal met 01-19-24  4.  Pt will amb. 69' with RW with +1 min assist for increased household accessibility.  Baseline:  Goal status: In Progress - 01-19-24  5.  Caregiver will verbalize understanding of assisting pt in HEP consisting of stretching, strengthening, and ROM for bil. LE's. Baseline:  Goal status: In Progress - pt has attended only 2 PT sessions since eval - 01-19-24   LONG TERM GOALS: Target date: 02-13-23  Pt will be modified independent with bed mobility including sit to/from supine and rolling to Rt and Lt sides.  Baseline:  Goal status: INITIAL  2.  Modified independent with transfers with pt demonstrating correct hand placement without any cues.  Baseline:  Goal status: INITIAL  3.  Pt will be able to stand at counter/sink for at least 3 with supervision for increased independence with self care & ADL's.  Baseline:  Goal status: INITIAL  4.  Pt will don/doff custom AFO (if obtained) with min assist. Baseline:  Goal status: INITIAL  5.  Pt will amb. 35' with RW with custom AFO on LLE for increased household ambulation. Baseline:  Goal status: INITIAL  6.  Independent  in instructing caregiver in HEP as needed for LE strengthening and stretching.  Baseline:  Goal status: INITIAL  ASSESSMENT:  CLINICAL IMPRESSION: PT session focused on LLE strengthening and stretching and gait training with use of Lt [place] AFO and Swedish knee cage.  Pt amb. 22' x 2 reps after completion of exercises and reported fatigue after gait training.  Pt has met STG's #2 and 3:  STG's #4 and 5 are in progress as not fully met as of current time.  Cont with POC.   OBJECTIVE IMPAIRMENTS: Abnormal gait, decreased activity tolerance, decreased balance, decreased endurance, decreased mobility, decreased ROM, decreased strength, impaired tone, and impaired UE functional use.   ACTIVITY LIMITATIONS: carrying, lifting, bending, standing, squatting, stairs, transfers, bed mobility, bathing, reach over head, hygiene/grooming, and locomotion level  PARTICIPATION LIMITATIONS: meal prep, cleaning, laundry, shopping, community activity, and pt does not drive  PERSONAL FACTORS: Age, Fitness, Time since onset of injury/illness/exacerbation, and 1-2 comorbidities: MS  are also affecting patient's functional outcome.   REHAB POTENTIAL: Good - due to chronicity of deficits  CLINICAL DECISION MAKING: Evolving/moderate complexity  EVALUATION COMPLEXITY: Moderate  PLAN:  PT FREQUENCY: 2x/week  PT DURATION: 8 weeks + eval  PLANNED INTERVENTIONS: 97110-Therapeutic exercises, 97530- Therapeutic activity, [id number]- Neuromuscular re-education, [id number]- Self Care, [id number]- Gait training, [phone]- Orthotic Initial, [phone]- Orthotic/Prosthetic subsequent, and Patient/Family education  PLAN FOR NEXT SESSION:  give HEP to pt: use Swedish knee cage and AFO with gait training   Aram Domzalski, [place], PT 01/19/2024, 1:25 PM        "

## 2024-01-19 NOTE — Therapy (Signed)
 " OUTPATIENT OCCUPATIONAL THERAPY NEURO TREATMENT  Patient Name: Kristen Boyle MRN: [id number] DOB:06/18/1960, 63 y.o., female Today's Date: 01/19/2024  PCP: Bernadine Manos, MD REFERRING PROVIDER: Lovie Clarity, MD  END OF SESSION:  OT End of Session - 01/19/24 1021     Visit Number 3    Number of Visits 20    Date for Recertification  03/19/24    Authorization Type Medicare A&B/Medicaid 2025    OT Start Time 1020    OT Stop Time 1100    OT Time Calculation (min) 40 min    Equipment Utilized During Treatment ADL AE/BSC    Activity Tolerance Patient tolerated treatment well    Behavior During Therapy [facility] for tasks assessed/performed          Past Medical History:  Diagnosis Date   Abnormality of gait 06/10/2012   Depression    Headache    Hyperlipidemia    MS (multiple sclerosis)    Past Surgical History:  Procedure Laterality Date   CHOLECYSTECTOMY N/A 06/20/2015   Procedure: LAPAROSCOPIC CHOLECYSTECTOMY;  Surgeon: Lynda Leos, MD;  Location: [facility];  Service: General;  Laterality: N/A;   DENTAL SURGERY     TUBAL LIGATION Bilateral    Patient Active Problem List   Diagnosis Date Noted   Chronic left shoulder pain 04/17/2023   Acute pain of left foot 04/17/2023   Osteoarthritis of left knee 03/13/2022   Elevated serum creatinine 10/04/2021   Right wrist pain 07/19/2020   Hot flashes due to menopause 12/01/2019   Encounter for therapeutic drug monitoring 04/12/2016   Constipation 04/12/2016   Vaginal atrophy 04/12/2016   Bilateral lower extremity edema with recent DOE 11/02/2015   Microcytosis 10/16/2014   Vitamin D  insufficiency 04/20/2014   Abnormality of gait 09/13/2013   Depression 09/19/2011   Multiple sclerosis 03/06/2011   Healthcare maintenance 03/06/2011   Hyperlipidemia 09/17/2010    ONSET DATE: Referral: 11/04/2023  REFERRING DIAG: G35.D (ICD-10-CM) - Multiple sclerosis  THERAPY DIAG:  Muscle weakness (generalized)  Other lack of  coordination  Other symptoms and signs involving the nervous system  Unsteadiness on feet  Need for assistance with personal care  Rationale for Evaluation and Treatment: Rehabilitation  SUBJECTIVE:   SUBJECTIVE STATEMENT: Pt and daughter report that she can do most things for herself but that then she is very tired afterwards, therefore daughter does things for her to help conserve her energy.   Pt accompanied by: self and family member - daughter - Agricultural Consultant (flight attendant)  PERTINENT HISTORY: MS since 2013 as well as history of depression, LE edema, osteoarthritis (B knees)  PRECAUTIONS: Fall  WEIGHT BEARING RESTRICTIONS: No  PAIN:  Are you having pain? No   FALLS: Has patient fallen in last 6 months? Yes. Number of falls Has fallen trying to get up into the bed, and trying to get to the bathroom.   LIVING ENVIRONMENT: Lives with: lives with their family - daughter & boyfriend Lives in: House/apartment - townhome Stairs: No Has following equipment at home: Single point cane, Environmental Consultant - 2 wheeled, Environmental Consultant - 4 wheeled, Wheelchair (manual), Tour manager, and Grab bars  PLOF: Independent with basic ADLs and Needs assistance with homemaking  PATIENT GOALS: Pt reported get up when I fall and walk faster to get to the bathroom.  OBJECTIVE:  Note: Objective measures were completed at Evaluation unless otherwise noted.  HAND DOMINANCE: Right  ADLs:   Pt's daughter reports she will help pt due to her low energy and  they have no assistance coming to home.  Pt can do things herself but daughter helps so she has energy for the rest of the day.   Daughter may be gone over night due to job as a financial controller.  Pt tends to stay in her room where she has a refrigerator but she does have to walk to the bathroom, just outside the bedroom door and pt does so while holding onto furniture.   Overall ADLs: Mod I for basic ADLs but gets assistance when available and has history of  falls Transfers/ambulation related to ADLs: Can get OOB on her own but still has trouble sometimes due to weak core.  Pt's daughter helps her back to bed Eating: Ind Grooming: Daughter combs hair, Pt rinses her mouth herself UB Dressing: Pt can do this on her own. LB Dressing: Pt reports changing her own pull-up s/p incontinence  Toileting: Has accidents of B&B and changes her own pull up.  Accidents of BM occur mostly if she has to take something for constipation Bathing: Pt reports she will get in the shower and clean herself if she has an accident. Tub Shower transfers: Pt holds onto furniture and walk to get to the bathroom.  Pt reports that upon return from the bathroom she usually has to sit in her chair a little bit and then she works to get back in the bed - ie) It takes me awhile.  Daughter reports that is is hard for pt to lift her legs high enoguh to get them in the bed Equipment: Transfer tub bench  IADLs: Shopping: Daughter Light housekeeping: Daughter, Pt reports she will help fold some laundry Meal Prep: Has refrigerator in her bedroom, and will get foods like yogurt, fruit, salads etc (no microwave), when her daughter is not home. Community mobility: Assisted with WC Medication management: Pt takes meds from pill bottles Financial management: TBA Handwriting: TBA  MOBILITY STATUS: Needs Assist: furniture walks at home (walker does not fit through the doorways), Hx of falls, and difficulty carrying objections with ambulation  POSTURE COMMENTS:  rounded shoulders and forward head Sitting balance: No issues while seated in WC at eval  ACTIVITY TOLERANCE: Activity tolerance: Limited - prefers the bed  FUNCTIONAL OUTCOME MEASURES: TBA - PSFS  Modified Barthel Index of ADLS ~ Modified Barthel Index Anastacio et al.) = 59 points Interpretation: 21-60 Severe dependency  UPPER EXTREMITY ROM:    Active ROM - TBA Right eval Left eval  Shoulder flexion    Shoulder  abduction    Shoulder adduction    Shoulder extension    Shoulder internal rotation    Shoulder external rotation    Elbow flexion    Elbow extension    Wrist flexion    Wrist extension    Wrist ulnar deviation    Wrist radial deviation    Wrist pronation    Wrist supination    (Blank rows = not tested)  UPPER EXTREMITY MMT:     MMT - TBA Right eval Left eval  Shoulder flexion    Shoulder abduction    Shoulder adduction    Shoulder extension    Shoulder internal rotation    Shoulder external rotation    Middle trapezius    Lower trapezius    Elbow flexion    Elbow extension    Wrist flexion    Wrist extension    Wrist ulnar deviation    Wrist radial deviation    Wrist pronation  Wrist supination    (Blank rows = not tested)  HAND FUNCTION: Grip strength: Right: 7.7, 9.0  lbs; Left: 11.9, 8.1 lbs  COORDINATION: 01/12/24 9 Hole Peg test: Right: 47.28  sec; Left: 45.20  sec Box and Blocks:  Right 29 blocks, Left 31 blocks R suppoorted on edge of Box & blocks test box verus pt lifted her L arm to complete the same test  SENSATION: TBA  EDEMA: NA  MUSCLE TONE: WFL  COGNITION: Overall cognitive status: Impaired - pt deferred to daughter to answer questions and indicated assistance needed with written material  VISION: Subjective report: Limited by time at eval Baseline vision: TBA Visual history: TBA  VISION ASSESSMENT: Not tested  Patient has difficulty with following activities due to following visual impairments: None reported at eval  EVAL OBSERVATIONS: Pt arrived with daughter in manual WC.  The pt appears well kept and has jacket donned.  Pt has limitations with end range ROM and is weak in BUEs affecting safety with ADLs.  She often defers to her daughter to answer questions and admits to falls but unable to correctly identify need to keep her phone with her at all times for safety.                                                                                                                               TODAY'S TREATMENT:    - Self Care education and training completed for duration as noted below including: OT session focused on ADL training, safety, and energy conservation related to MS. Patient practiced lower-body dressing with emphasis on footwear management. With verbal instruction and demonstration, patient practiced donning shoes using a foot funnel to reduce trunk flexion and fatigue compared to crossing the leg to the opposite knee. OT provided education regarding adaptive footwear options, including slip-on Skechers, magnetic shoe fasteners, and elastic shoelaces to improve independence and decrease time and effort required for shoe management. Patient also practiced use of a reacher to stabilize and hold the shoe tongue while reaching toward the floor, improving success with donning while maintaining balance.  OT provided education and hands-on training regarding bedside commode [facility]) use for safety during urgency. Education included strategies to reduce odor, such as placing a paper towel in the bag to absorb moisture, immediately tying and disposing of the bag, and double-bagging if needed. Emphasized use of BSC for safety and urgency rather than routine toileting. Patient and daughter verbalized understanding.  Functional mobility addressed through bed mobility and BSC transfer training. Patient practiced transitions with cueing to use larger, more deliberate movements to generate momentum and reduce effort. OT provided verbal and tactile cues to encourage turning the body to allow the hand closest to the surface to reach the opposite armrest, rather than holding both armrests simultaneously, to improve efficiency and reduce need for mid-transfer repositioning. Patient demonstrated improved technique with practice.  Patient educated on proper breathing techniques during effortful tasks to prevent  breath-holding and reduce fatigue,  including exhaling with exertion and counting out loud during movements as a strategy to promote continuous breathing. Education provided on how breath-holding can increase fatigue and dizziness. Patient verbalized understanding and demonstrated improved breathing during practiced activities with verbal cues.  PATIENT EDUCATION: Education details: ADL comp strategies/equipment Person educated: Patient and Child(ren) Education method: Explanation, Demonstration, Tactile cues, and Verbal cues Education comprehension: verbalized understanding, returned demonstration, verbal cues required, tactile cues required, and needs further education  HOME EXERCISE PROGRAM: 01/12/24: Putty HEP Access Code: [email or web address]    GOALS: Goals reviewed with patient? Yes  SHORT TERM GOALS: Target date: 01/21/24  Patient will demonstrate initial B UE HEP with 25% verbal cues or less for proper execution. Baseline: New to outpt OT s/p MS dx 2013 Goal status: IN Progress  2.  Pt will participate in UE strengthening and functional tasks to improve grip strength by at least 2-3 lbs bilaterally to support safe device use and transfers. Baseline: Right: 7.7, 9.0  lbs; Left: 11.9, 8.1 lbs Goal status: IN Progress  3.  Patient will demo improved FM coordination as evidenced by completing nine-hole peg with use of R/L UE in as close to 40 seconds as possible.  Baseline: 01/12/24 9 Hole Peg test: Right: 47.28  sec; Left: 45.20  sec Box and Blocks:  Right 29 blocks, Left 31 blocks Goal status: IN Progress  4.  Pt/daughter will verbalize understanding of adapted strategies and/or equipment PRN to increase safety and independence with ADLs and IADLs (I.e. with decreased pain and joint protection).             Baseline: New to outpt OT             Goal Status: IN Progress  5.  Pt will complete toileting routine (transfer, hygiene, return to bed) with no episodes of instability or near-fall, utilizing energy conservation and  appropriate DME as trained. Baseline: History of falls Goal status: IN Progress   LONG TERM GOALS: Target date: 03/20/24  Patient will demonstrate updated B UE HEP with visual handouts only for proper execution to improve strength and coordination.  Baseline: New to outpt OT s/p MS dx 2013; Grip strength: Right: 7.7, 9.0  lbs; Left: 11.9, 8.1 lbs Goal status: INITIAL  2.  Pt will implement use of 3-4 energy conservation strategies (task modification, planned rest breaks, environmental setup, adaptive tools) to prevent exhaustion and reduce reliance on her daughter for ADLs. Baseline: New to outpt OT s/p MS dx 2013 Goal status: INITIAL  3.  Pt will use a memory strategies (phone reminders, notebook, wall calendar) to track medications, appointments and safety recommendations r/t AE and phone use when alone with 90% consistency Baseline: New to outpt OT  Goal status: INITIAL  4.  Patient will report at least two-point increase in average PSFS score or at least three-point increase in a single activity score indicating functionally significant improvement given minimum detectable change. Baseline: TBA total score (See above for individual activity scores) Goal status: INITIAL   ASSESSMENT:  CLINICAL IMPRESSION: Patient is a 63 y.o. female who was seen today for occupational therapy treatment for Multiple Sclerosis. Pt/daughter responded well to ADL comp strategies/equipment education today as well as ADL transfer training/practice. Pt would benefit from skilled OT services in the outpatient setting to work on impairments as noted below to help pt return to PLOF as able.    PERFORMANCE DEFICITS: in functional skills including ADLs, IADLs, coordination, dexterity, ROM, strength, pain, flexibility,  Fine motor control, Gross motor control, mobility, balance, endurance, continence, decreased knowledge of precautions, decreased knowledge of use of DME, and UE functional use, cognitive skills  including learn, memory, problem solving, safety awareness, thought, and understand, and psychosocial skills including coping strategies, environmental adaptation, habits, interpersonal interactions, and routines and behaviors.   IMPAIRMENTS: are limiting patient from ADLs, IADLs, rest and sleep, leisure, and social participation.   CO-MORBIDITIES: may have co-morbidities  that affects occupational performance. Patient will benefit from skilled OT to address above impairments and improve overall function.  REHAB POTENTIAL: Fair Duration of dx  PLAN:  OT FREQUENCY: 1-2x/week  OT DURATION: 12 weeks - POC extended for scheduled purposes  PLANNED INTERVENTIONS: 97168 OT Re-evaluation, 97535 self care/ADL training, 02889 therapeutic exercise, 97530 therapeutic activity, 97112 neuromuscular re-education, 97140 manual therapy, 97113 aquatic therapy, [id number] Cognitive training (first 15 min), 02869 Cognitive training(each additional 15 min), 02457 Wheelchair Management, 97760 Orthotic Initial, [id number] Orthotic/Prosthetic subsequent, balance training, functional mobility training, visual/perceptual remediation/compensation, psychosocial skills training, energy conservation, coping strategies training, patient/family education, and DME and/or AE instructions  RECOMMENDED OTHER SERVICES: May need dental consult and potentially ST services  CONSULTED AND AGREED WITH PLAN OF CARE: Patient and family member/caregiver  PLAN FOR NEXT SESSION:  Assess ROM/vision & PSFS - get to bathroom HEPs - putty (12/22), coordination etc AE considerations ADL comp training  Clarita LITTIE Pride, OT 01/19/2024, 12:13 PM           "

## 2024-01-20 ENCOUNTER — Ambulatory Visit: Admitting: Physical Therapy

## 2024-01-20 ENCOUNTER — Ambulatory Visit: Admitting: Occupational Therapy

## 2024-01-23 ENCOUNTER — Telehealth (HOSPITAL_BASED_OUTPATIENT_CLINIC_OR_DEPARTMENT_OTHER): Payer: Self-pay | Admitting: Student

## 2024-01-23 NOTE — Telephone Encounter (Signed)
 Patient order for her brace has expired and she needs a new. Please contact through mychart or [phone]

## 2024-01-26 ENCOUNTER — Ambulatory Visit: Admitting: Physical Therapy

## 2024-01-26 ENCOUNTER — Ambulatory Visit: Attending: Internal Medicine | Admitting: Occupational Therapy

## 2024-01-26 DIAGNOSIS — R2689 Other abnormalities of gait and mobility: Secondary | ICD-10-CM

## 2024-01-26 DIAGNOSIS — R2681 Unsteadiness on feet: Secondary | ICD-10-CM | POA: Diagnosis present

## 2024-01-26 DIAGNOSIS — R29818 Other symptoms and signs involving the nervous system: Secondary | ICD-10-CM | POA: Diagnosis present

## 2024-01-26 DIAGNOSIS — R296 Repeated falls: Secondary | ICD-10-CM | POA: Diagnosis present

## 2024-01-26 DIAGNOSIS — R278 Other lack of coordination: Secondary | ICD-10-CM | POA: Diagnosis present

## 2024-01-26 DIAGNOSIS — Z741 Need for assistance with personal care: Secondary | ICD-10-CM | POA: Diagnosis present

## 2024-01-26 DIAGNOSIS — M6281 Muscle weakness (generalized): Secondary | ICD-10-CM | POA: Diagnosis present

## 2024-01-26 NOTE — Therapy (Unsigned)
 " OUTPATIENT PHYSICAL THERAPY NEURO TREATMENT NOTE   Patient Name: Kristen Boyle MRN: [id number] DOB:12-27-1960, 64 y.o., female Today's Date: 01/28/2024   PCP: Bernadine Manos, MD REFERRING PROVIDER: Emiliano Leonce CROME, PA-C  END OF SESSION:  PT End of Session - 01/28/24 1248     Visit Number 4    Number of Visits 17    Date for Recertification  02/06/24    Authorization Type Medicare    Authorization Time Period 12-09-23 - 02-21-24    Progress Note Due on Visit 10    PT Start Time 0936    PT Stop Time 1015    PT Time Calculation (min) 39 min    Equipment Utilized During Treatment Gait belt;Other (comment)   Swedish knee cage, Lt [place] AFO   Activity Tolerance Patient tolerated treatment well    Behavior During Therapy WFL for tasks assessed/performed             Past Medical History:  Diagnosis Date   Abnormality of gait 06/10/2012   Depression    Headache    Hyperlipidemia    MS (multiple sclerosis)    Past Surgical History:  Procedure Laterality Date   CHOLECYSTECTOMY N/A 06/20/2015   Procedure: LAPAROSCOPIC CHOLECYSTECTOMY;  Surgeon: Lynda Leos, MD;  Location: [facility];  Service: General;  Laterality: N/A;   DENTAL SURGERY     TUBAL LIGATION Bilateral    Patient Active Problem List   Diagnosis Date Noted   Chronic left shoulder pain 04/17/2023   Acute pain of left foot 04/17/2023   Osteoarthritis of left knee 03/13/2022   Elevated serum creatinine 10/04/2021   Right wrist pain 07/19/2020   Hot flashes due to menopause 12/01/2019   Encounter for therapeutic drug monitoring 04/12/2016   Constipation 04/12/2016   Vaginal atrophy 04/12/2016   Bilateral lower extremity edema with recent DOE 11/02/2015   Microcytosis 10/16/2014   Vitamin D  insufficiency 04/20/2014   Abnormality of gait 09/13/2013   Depression 09/19/2011   Multiple sclerosis 03/06/2011   Healthcare maintenance 03/06/2011   Hyperlipidemia 09/17/2010    ONSET DATE: Referral  date 10-17-23  REFERRING DIAG: M25.562,G89.29 (ICD-10-CM) - Chronic pain of left knee G35.D (ICD-10-CM) - Multiple sclerosis  THERAPY DIAG:  Muscle weakness (generalized)  Other abnormalities of gait and mobility  Unsteadiness on feet  Rationale for Evaluation and Treatment: Rehabilitation  SUBJECTIVE:                                                                                                                                                                                             SUBJECTIVE STATEMENT: Pt's daughter reports pt  has had frequent urination - recommended that she contact PCP regarding this for further evaluation.  Daughter states that she contacted ortho MD's office regarding need for face to face visit for AFO - mistakenly informed MD's office that order for brace had expired.  Daughter was informed that order had not expired - but still need to schedule face to face visit Pt accompanied by: family member - daughter, [name]  PERTINENT HISTORY:  MS since 1999 as well as history of depression, LE edema, osteoarthritis (B knees)   Per chart note; Currently, gait is worse and she stopped using a walker ater a fall.   She felt she had some benefit from PT in the past but has not done over last year.  She can transfer independently most of the time but sometimes needs assistance.    She has left > right leg weakness.  She notes mild hand weakness.  Pressing buttons is more difficult.    She has left leg stiffness/spasticity as well.   She has numbness in her left hand and both legs.  She did not try the gabapentin .      She has some urinary incontinence so wears Depends.     She only gets a sensation a few seconds before she needs to go.    She has had notable dysconjugate gaze since 2017 or so.   Her left eye has very poor vision so does not note diplopia. .  Visual symptoms OS started before the MS diagnosis but were mild until more recently.    PAIN:  Are you  having pain? No  PRECAUTIONS: Fall  RED FLAGS: None   WEIGHT BEARING RESTRICTIONS: No  FALLS: Has patient fallen in last 6 months? Yes. Number of falls 2 - used the cover from the bed to pull herself up   LIVING ENVIRONMENT: Lives with: lives with their family and (daughter and her boyfriend) Lives in: Other townhome Stairs: No - small threshold only Has following equipment at home: Environmental Consultant - 2 wheeled, Environmental Consultant - 4 wheeled, Wheelchair (manual), Tour manager, and Grab bars  PLOF: Independent with basic ADLs, Independent with household mobility with device, Independent with transfers, Requires assistive device for independence, and Needs assistance with homemaking  PATIENT GOALS: be able to transfer from supine to sitting; improve transfers and walking   OBJECTIVE:  Note: Objective measures were completed at Evaluation unless otherwise noted.  DIAGNOSTIC FINDINGS: None since Aug. 2022 (MRI)  COGNITION: Overall cognitive status: Within functional limits for tasks assessed   SENSATION: Not tested  COORDINATION: Decreased LLE due to minimal AROM due to weakness   MUSCLE TONE: LLE: Moderate extensor tone   POSTURE: rounded shoulders, forward head, and increased thoracic kyphosis  LOWER EXTREMITY ROM:   RLE WFL's;  minimal AROM LLE    LOWER EXTREMITY MMT:    MMT Right Eval Left Eval  Hip flexion 3- 2-  Hip extension 2+ - 3-/5 2 - 2+/5   Hip abduction    Hip adduction    Hip internal rotation    Hip external rotation    Knee flexion 3 2-  Knee extension 4 3+  Ankle dorsiflexion 3+ 2-  Ankle plantarflexion    Ankle inversion    Ankle eversion    (Blank rows = not tested)  BED MOBILITY:  Findings: Sit to supine Min A Supine to sit Min A Rolling to Right Min A  TRANSFERS: Sit to stand: CGA  Assistive device utilized: Environmental Consultant - 2 wheeled  Stand to sit: CGA  Assistive device utilized: Environmental Consultant - 2 wheeled     Needs cues for correct hand placement RAMP:  Not  tested  CURB:  Not tested  STAIRS: Not tested GAIT: Findings: Gait Characteristics: step through pattern, decreased step length- Left, decreased hip/knee flexion- Left, decreased ankle dorsiflexion- Left, and genu recurvatum- Left, Distance walked: 15', Assistive device utilized:Walker - 2 wheeled, Level of assistance: Min A and Mod A, and Comments: mod assist with fatigue15';  severe hyperextension Lt knee in stance - needs custom molded AFO   FUNCTIONAL TESTS:  Pt unable to stand unsupported                                                                                                                               TREATMENT DATE: 01-26-24  TherAct: Pt transferred wheelchair to mat with CGA using squat pivot transfer (toward Lt side)  Pt stood for 2:45 for assessment of static standing; used computer table (locked) in front of patient transferred 4 Squigz from Lt to/from Rt sides for activity - CGA  Gait: Toe off AFO and Swedish knee cage used on LLE - pt gait trained with RW 115' (1 lap) with min to mod assist; cues to lift RLE for increased clearance; pt needs cues for correct hand placement with sit to/from stand with use of RW  TherEx:  SciFit level 2.5 x 5 with UE's and LE's for strengthening     Access Code: [email or web address] URL: [email or web address] Date: 01/19/2024 Prepared by: Rock Kussmaul  Exercises - Single Leg Bridge  - 1 x daily - 7 x weekly - 1 sets - 10 reps - Supine Lower Trunk Rotation  - 1 x daily - 7 x weekly - 1 sets - 2 reps - 10 hold - Supine Bridge  - 1 x daily - 7 x weekly - 1 sets - 10 reps - Hip and Knee Extension and Flexion Caregiver PROM  - 1 x daily - 7 x weekly - 1 sets - 10 reps - Clamshell  - 1 x daily - 7 x weekly - 1 sets - 10 reps - Bent Knee Fallouts  - 1 x daily - 7 x weekly - 1 sets - 10 reps   PATIENT EDUCATION: Education details:  informed pt and daughter that a face to face visit with referring MD Randene Reveal, [place])  would need to be scheduled in order to obtain the custom Lt AFO (order for AFO has been obtained); [employer] [email or web address] Person educated: Patient and Child(ren) Education method: Explanation Education comprehension: verbalized understanding  HOME EXERCISE PROGRAM: [employer] - SEE ABOVE  GOALS: Goals reviewed with patient? Yes  SHORT TERM GOALS: Target date: 01-16-24 (pushed out 1 week to allow for scheduling delays)  Pt will transfer sit to stand with bil. UE support from mat and will stand for 2 at counter with UE support with CGA/SBA for increased independence with ADL's. Baseline: CGA - verbal  cues needed for correct hand placement Goal status: Goal met 01-26-24  2.  Pt will transfer sit to supine with CGA for LE management. Baseline:  Goal status: Goal met 01-19-24  3.  Initiate process to obtain custom AFO for LLE.  Baseline:  Goal status: Goal met 01-19-24  4.  Pt will amb. 65' with RW with +1 min assist for increased household accessibility.  Baseline:  Goal status: In Progress - 01-19-24  5.  Caregiver will verbalize understanding of assisting pt in HEP consisting of stretching, strengthening, and ROM for bil. LE's. Baseline:  Goal status: In Progress - pt has attended only 2 PT sessions since eval - 01-19-24   LONG TERM GOALS: Target date: 02-13-23  Pt will be modified independent with bed mobility including sit to/from supine and rolling to Rt and Lt sides.  Baseline:  Goal status: INITIAL  2.  Modified independent with transfers with pt demonstrating correct hand placement without any cues.  Baseline:  Goal status: INITIAL  3.  Pt will be able to stand at counter/sink for at least 3 with supervision for increased independence with self care & ADL's.  Baseline:  Goal status: INITIAL  4.  Pt will don/doff custom AFO (if obtained) with min assist. Baseline:  Goal status: INITIAL  5.  Pt will amb. 62' with RW with custom AFO on LLE for increased household  ambulation. Baseline:  Goal status: INITIAL  6.  Independent in instructing caregiver in HEP as needed for LE strengthening and stretching.  Baseline:  Goal status: INITIAL  ASSESSMENT:  CLINICAL IMPRESSION: PT session focused on gait training with use of Swedish knee cage and AFO on LLE and strengthening exercise with pt performing SciFit exercise for 5 today.  Pt significantly increased ambulation distance from 22' x 2 reps in previous session to 115' in today's session.  Gait training was performed at start of session so fatigue was not a factor as it was possibly in previous session with mat exercises having been performed prior to gait training.  Cont with POC.   OBJECTIVE IMPAIRMENTS: Abnormal gait, decreased activity tolerance, decreased balance, decreased endurance, decreased mobility, decreased ROM, decreased strength, impaired tone, and impaired UE functional use.   ACTIVITY LIMITATIONS: carrying, lifting, bending, standing, squatting, stairs, transfers, bed mobility, bathing, reach over head, hygiene/grooming, and locomotion level  PARTICIPATION LIMITATIONS: meal prep, cleaning, laundry, shopping, community activity, and pt does not drive  PERSONAL FACTORS: Age, Fitness, Time since onset of injury/illness/exacerbation, and 1-2 comorbidities: MS  are also affecting patient's functional outcome.   REHAB POTENTIAL: Good - due to chronicity of deficits  CLINICAL DECISION MAKING: Evolving/moderate complexity  EVALUATION COMPLEXITY: Moderate  PLAN:  PT FREQUENCY: 2x/week  PT DURATION: 8 weeks + eval  PLANNED INTERVENTIONS: 97110-Therapeutic exercises, 97530- Therapeutic activity, [id number]- Neuromuscular re-education, 97535- Self Care, [id number]- Gait training, [phone]- Orthotic Initial, [phone]- Orthotic/Prosthetic subsequent, and Patient/Family education  PLAN FOR NEXT SESSION:  use Swedish knee cage and AFO with gait training; face to face visit scheduled?   [place],  PT 01/28/2024, 12:53 PM        "

## 2024-01-26 NOTE — Therapy (Signed)
 " OUTPATIENT OCCUPATIONAL THERAPY NEURO TREATMENT  Patient Name: Kristen Boyle MRN: [id number] DOB:Sep 27, 1960, 64 y.o., female Today's Date: 01/26/2024  PCP: Bernadine Manos, MD REFERRING PROVIDER: Lovie Clarity, MD  END OF SESSION:  OT End of Session - 01/26/24 1020     Visit Number 4    Number of Visits 20    Date for Recertification  03/19/24    Authorization Type Medicare A&B/Medicaid 2025    OT Start Time 1020    OT Stop Time 1100    OT Time Calculation (min) 40 min    Activity Tolerance Patient tolerated treatment well    Behavior During Therapy [facility] for tasks assessed/performed          Past Medical History:  Diagnosis Date   Abnormality of gait 06/10/2012   Depression    Headache    Hyperlipidemia    MS (multiple sclerosis)    Past Surgical History:  Procedure Laterality Date   CHOLECYSTECTOMY N/A 06/20/2015   Procedure: LAPAROSCOPIC CHOLECYSTECTOMY;  Surgeon: Lynda Leos, MD;  Location: [facility];  Service: General;  Laterality: N/A;   DENTAL SURGERY     TUBAL LIGATION Bilateral    Patient Active Problem List   Diagnosis Date Noted   Chronic left shoulder pain 04/17/2023   Acute pain of left foot 04/17/2023   Osteoarthritis of left knee 03/13/2022   Elevated serum creatinine 10/04/2021   Right wrist pain 07/19/2020   Hot flashes due to menopause 12/01/2019   Encounter for therapeutic drug monitoring 04/12/2016   Constipation 04/12/2016   Vaginal atrophy 04/12/2016   Bilateral lower extremity edema with recent DOE 11/02/2015   Microcytosis 10/16/2014   Vitamin D  insufficiency 04/20/2014   Abnormality of gait 09/13/2013   Depression 09/19/2011   Multiple sclerosis 03/06/2011   Healthcare maintenance 03/06/2011   Hyperlipidemia 09/17/2010    ONSET DATE: Referral: 11/04/2023  REFERRING DIAG: G35.D (ICD-10-CM) - Multiple sclerosis  THERAPY DIAG:  Muscle weakness (generalized)  Other lack of coordination  Other symptoms and signs involving  the nervous system  Need for assistance with personal care  Repeated falls  Rationale for Evaluation and Treatment: Rehabilitation  SUBJECTIVE:   SUBJECTIVE STATEMENT: Pt reports she has been having increased need to go to the bathroom with increased accidents requiting her to change her diaper more often.   Pt accompanied by: self and family member - daughter - Agricultural Consultant (flight attendant)  PERTINENT HISTORY: MS since 2013 as well as history of depression, LE edema, osteoarthritis (B knees)  PRECAUTIONS: Fall  WEIGHT BEARING RESTRICTIONS: No  PAIN:  Are you having pain? No   FALLS: Has patient fallen in last 6 months? Yes. Number of falls Has fallen trying to get up into the bed, and trying to get to the bathroom.   LIVING ENVIRONMENT: Lives with: lives with their family - daughter & boyfriend Lives in: House/apartment - townhome Stairs: No Has following equipment at home: Single point cane, Environmental Consultant - 2 wheeled, Environmental Consultant - 4 wheeled, Wheelchair (manual), Tour manager, and Grab bars  PLOF: Independent with basic ADLs and Needs assistance with homemaking  PATIENT GOALS: Pt reported get up when I fall and walk faster to get to the bathroom.  OBJECTIVE:  Note: Objective measures were completed at Evaluation unless otherwise noted.  HAND DOMINANCE: Right  ADLs:   Pt's daughter reports she will help pt due to her low energy and they have no assistance coming to home.  Pt can do things herself but daughter helps so  she has energy for the rest of the day.   Daughter may be gone over night due to job as a financial controller.  Pt tends to stay in her room where she has a refrigerator but she does have to walk to the bathroom, just outside the bedroom door and pt does so while holding onto furniture.   Overall ADLs: Mod I for basic ADLs but gets assistance when available and has history of falls Transfers/ambulation related to ADLs: Can get OOB on her own but still has trouble  sometimes due to weak core.  Pt's daughter helps her back to bed Eating: Ind Grooming: Daughter combs hair, Pt rinses her mouth herself UB Dressing: Pt can do this on her own. LB Dressing: Pt reports changing her own pull-up s/p incontinence  Toileting: Has accidents of B&B and changes her own pull up.  Accidents of BM occur mostly if she has to take something for constipation Bathing: Pt reports she will get in the shower and clean herself if she has an accident. Tub Shower transfers: Pt holds onto furniture and walk to get to the bathroom.  Pt reports that upon return from the bathroom she usually has to sit in her chair a little bit and then she works to get back in the bed - ie) It takes me awhile.  Daughter reports that is is hard for pt to lift her legs high enoguh to get them in the bed Equipment: Transfer tub bench  IADLs: Shopping: Daughter Light housekeeping: Daughter, Pt reports she will help fold some laundry Meal Prep: Has refrigerator in her bedroom, and will get foods like yogurt, fruit, salads etc (no microwave), when her daughter is not home. Community mobility: Assisted with WC Medication management: Pt takes meds from pill bottles Financial management: TBA Handwriting: TBA  MOBILITY STATUS: Needs Assist: furniture walks at home (walker does not fit through the doorways), Hx of falls, and difficulty carrying objections with ambulation  POSTURE COMMENTS:  rounded shoulders and forward head Sitting balance: No issues while seated in WC at eval  ACTIVITY TOLERANCE: Activity tolerance: Limited - prefers the bed  FUNCTIONAL OUTCOME MEASURES: TBA - PSFS  Modified Barthel Index of ADLS ~ Modified Barthel Index Anastacio et al.) = 59 points Interpretation: 21-60 Severe dependency  UPPER EXTREMITY ROM:    Active ROM - TBA Right eval Left eval  Shoulder flexion    Shoulder abduction    Shoulder adduction    Shoulder extension    Shoulder internal rotation     Shoulder external rotation    Elbow flexion    Elbow extension    Wrist flexion    Wrist extension    Wrist ulnar deviation    Wrist radial deviation    Wrist pronation    Wrist supination    (Blank rows = not tested)  UPPER EXTREMITY MMT:     MMT - TBA Right eval Left eval  Shoulder flexion    Shoulder abduction    Shoulder adduction    Shoulder extension    Shoulder internal rotation    Shoulder external rotation    Middle trapezius    Lower trapezius    Elbow flexion    Elbow extension    Wrist flexion    Wrist extension    Wrist ulnar deviation    Wrist radial deviation    Wrist pronation    Wrist supination    (Blank rows = not tested)  HAND FUNCTION: Grip strength: Right: 7.7,  9.0  lbs; Left: 11.9, 8.1 lbs  COORDINATION: 01/12/24 9 Hole Peg test: Right: 47.28  sec; Left: 45.20  sec Box and Blocks:  Right 29 blocks, Left 31 blocks R suppoorted on edge of Box & blocks test box verus pt lifted her L arm to complete the same test  SENSATION: TBA  EDEMA: NA  MUSCLE TONE: WFL  COGNITION: Overall cognitive status: Impaired - pt deferred to daughter to answer questions and indicated assistance needed with written material  VISION: Subjective report: Limited by time at eval Baseline vision: TBA Visual history: TBA  VISION ASSESSMENT: Not tested  Patient has difficulty with following activities due to following visual impairments: None reported at eval  EVAL OBSERVATIONS: Pt arrived with daughter in manual WC.  The pt appears well kept and has jacket donned.  Pt has limitations with end range ROM and is weak in BUEs affecting safety with ADLs.  She often defers to her daughter to answer questions and admits to falls but unable to correctly identify need to keep her phone with her at all times for safety.                                                                                                                              TODAY'S TREATMENT:    -  Self Care education and training completed for duration as noted below including:  OT provided further education re: scheduled toileting, possible bedside commode [facility]) use for safety during urgency and suggestion re: tracking toileting schedule with ideas on toileting diary as provided as a handout. Education re: scheduled toileting ie) every 2 hours to minimize accidents, tendency to get hot lashes when she needs to toilet which limit her ease with ambulation and to decrease energy needed to change pullup/clothing with accidents.  Continued to provide suggestions to increase cleanliness with BSC via reducing odors by placing a paper towel in the bag to absorb moisture, immediately tying and disposing of the bag, and double-bagging if needed. Emphasized use of BSC for safety and urgency rather than routine toileting which could be achieved with scheduled trips to the bathroom just outside her bedroom door. Patient and daughter verbalized understanding.  OT educated patient on principles of energy conservation including positioning materials, pacing, prioritizing, and planning to maximize efficiency and safety with completion of functional activities as desired.  Patient verbalized understanding of all information provided and was given a checklist and packet that was reviewed and provided for her to take home for review.  PATIENT EDUCATION: Education details: Solicitor educated: Patient and Child(ren) Education method: Programmer, Multimedia, Facilities Manager, Verbal cues, and Handouts Education comprehension: verbalized understanding, returned demonstration, verbal cues required, tactile cues required, and needs further education  HOME EXERCISE PROGRAM: 01/12/24: Putty HEP Access Code: [facility]  01/26/24: Energy Conservation and Scheduled Toileting Analysis   GOALS: Goals reviewed with patient? Yes  SHORT TERM GOALS: Target date: 01/21/24  Patient will demonstrate initial B UE  HEP with 25%  verbal cues or less for proper execution. Baseline: New to outpt OT s/p MS dx 2013 Goal status: IN Progress  2.  Pt will participate in UE strengthening and functional tasks to improve grip strength by at least 2-3 lbs bilaterally to support safe device use and transfers. Baseline: Right: 7.7, 9.0  lbs; Left: 11.9, 8.1 lbs Goal status: IN Progress  3.  Patient will demo improved FM coordination as evidenced by completing nine-hole peg with use of R/L UE in as close to 40 seconds as possible.  Baseline: 01/12/24 9 Hole Peg test: Right: 47.28  sec; Left: 45.20  sec Box and Blocks:  Right 29 blocks, Left 31 blocks Goal status: IN Progress  4.  Pt/daughter will verbalize understanding of adapted strategies and/or equipment PRN to increase safety and independence with ADLs and IADLs (I.e. with decreased pain and joint protection).             Baseline: New to outpt OT             Goal Status: IN Progress  5.  Pt will complete toileting routine (transfer, hygiene, return to bed) with no episodes of instability or near-fall, utilizing energy conservation and appropriate DME as trained. Baseline: History of falls Goal status: IN Progress   LONG TERM GOALS: Target date: 03/20/24  Patient will demonstrate updated B UE HEP with visual handouts only for proper execution to improve strength and coordination.  Baseline: New to outpt OT s/p MS dx 2013; Grip strength: Right: 7.7, 9.0  lbs; Left: 11.9, 8.1 lbs Goal status: INITIAL  2.  Pt will implement use of 3-4 energy conservation strategies (task modification, planned rest breaks, environmental setup, adaptive tools) to prevent exhaustion and reduce reliance on her daughter for ADLs. Baseline: New to outpt OT s/p MS dx 2013 Goal status: INITIAL  3.  Pt will use a memory strategies (phone reminders, notebook, wall calendar) to track medications, appointments and safety recommendations r/t AE and phone use when alone with 90% consistency Baseline:  New to outpt OT  Goal status: INITIAL  4.  Patient will report at least two-point increase in average PSFS score or at least three-point increase in a single activity score indicating functionally significant improvement given minimum detectable change. Baseline: TBA total score (See above for individual activity scores) Goal status: INITIAL   ASSESSMENT:  CLINICAL IMPRESSION: Patient is a 64 y.o. female who was seen today for occupational therapy treatment for Multiple Sclerosis. Pt/daughter responded well to ADL comp strategies/equipment education today as well as energy conservation training. Pt would benefit from further skilled OT services in the outpatient setting to work on impairments as noted below to help pt return to PLOF as able.    PERFORMANCE DEFICITS: in functional skills including ADLs, IADLs, coordination, dexterity, ROM, strength, pain, flexibility, Fine motor control, Gross motor control, mobility, balance, endurance, continence, decreased knowledge of precautions, decreased knowledge of use of DME, and UE functional use, cognitive skills including learn, memory, problem solving, safety awareness, thought, and understand, and psychosocial skills including coping strategies, environmental adaptation, habits, interpersonal interactions, and routines and behaviors.   IMPAIRMENTS: are limiting patient from ADLs, IADLs, rest and sleep, leisure, and social participation.   CO-MORBIDITIES: may have co-morbidities  that affects occupational performance. Patient will benefit from skilled OT to address above impairments and improve overall function.  REHAB POTENTIAL: Fair Duration of dx  PLAN:  OT FREQUENCY: 1-2x/week  OT DURATION: 12 weeks - POC extended for  scheduled purposes  PLANNED INTERVENTIONS: 97168 OT Re-evaluation, 97535 self care/ADL training, 02889 therapeutic exercise, 97530 therapeutic activity, 97112 neuromuscular re-education, 97140 manual therapy, [phone] aquatic  therapy, [phone] Cognitive training (first 15 min), 02869 Cognitive training(each additional 15 min), 02457 Wheelchair Management, [phone] Orthotic Initial, [phone] Orthotic/Prosthetic subsequent, balance training, functional mobility training, visual/perceptual remediation/compensation, psychosocial skills training, energy conservation, coping strategies training, patient/family education, and DME and/or AE instructions  RECOMMENDED OTHER SERVICES: May need dental consult and potentially ST services  CONSULTED AND AGREED WITH PLAN OF CARE: Patient and family member/caregiver  PLAN FOR NEXT SESSION:  Assess ROM/vision & PSFS - get to bathroom HEPs - putty (12/22), coordination etc AE considerations ADL comp training  Retest/check goals  Clarita LITTIE Pride, OT 01/26/2024, 2:33 PM           "

## 2024-01-26 NOTE — Patient Instructions (Addendum)
"    °  Time of Day  Food & Drink Medication Urge Present Scale 0-3 BM/Accident Cathedar Activity/Notes  Midnight       1 AM       2       3       4       5       6        7        8         9         10         11         12  Noon         1 PM         2         3         4         5         6         7         8        9        10        11         Energy Conservation Techniques  Sit for as many activities as possible. Use slow, smooth movements.  Rushing increases discomfort. Determine the necessity of performing the task.  Simplify those tasks that are necessary.  (Get clothes out of the dryer when they are warm instead of ironing, let dishes air dry, etc.) Take frequent rests both during and between activities.  Avoid repetitive tasks. Pre-plan your activities; try a daily and/or weekly schedule.  Spread out the activities that are most fatiguing (break up cleaning tasks over multiple days). Remember to plan a balance of work, rest and recreation. Consider the best time for each activity.  Do the most exertive task when you have the most energy. Don't carry items if you can push them.  Slide, don't lift. Push, don't pull. Utilize two hands when appropriate. Maintain good posture and use proper body mechanics. Avoid remaining in one position for too long. When lifting, bend at the knees, not at the waist.  Exhale when bending down, inhale when straightening up. Carry objects as close to your body and as near to the center of the pelvis.  11. Avoid wasted body movements (position yourself for the task so that you avoid bending, twisting, etc. when possible). 12. Select the best working environment.  Consider lighting, ventilation, clothing, and equipment. 13. Organize your storage areas, making the items you use daily convenient.  Store heaviest items at waist height.  Store frequently used items between shoulders and knee height.  Consider  leaving frequently used items on countertops.  (You can organize in storage baskets based on time used/purpose). 14. Feelings and emotions can be real causes of fatigue.  Try to avoid unnecessary worry, irritation, or frustration.  Avoid stress, it can also be a source of fatigue. 15. Get help from other people for difficult tasks. 16. Explore equipment or items that may be able to do the job for you with greater ease.  (Electric can openers, blenders, lightweight items for cleaning, etc.)          "

## 2024-01-27 ENCOUNTER — Ambulatory Visit: Admitting: Physical Therapy

## 2024-01-27 ENCOUNTER — Encounter: Payer: Self-pay | Admitting: Emergency Medicine

## 2024-01-27 ENCOUNTER — Ambulatory Visit: Admission: EM | Admit: 2024-01-27 | Discharge: 2024-01-27 | Disposition: A | Discharge status: Home / Self Care

## 2024-01-27 ENCOUNTER — Ambulatory Visit: Admitting: Occupational Therapy

## 2024-01-27 ENCOUNTER — Other Ambulatory Visit: Payer: Self-pay

## 2024-01-27 ENCOUNTER — Ambulatory Visit: Payer: Self-pay

## 2024-01-27 DIAGNOSIS — N3001 Acute cystitis with hematuria: Secondary | ICD-10-CM | POA: Diagnosis not present

## 2024-01-27 LAB — POCT URINE DIPSTICK
Bilirubin, UA: NEGATIVE
Glucose, UA: NEGATIVE mg/dL
Ketones, POC UA: NEGATIVE mg/dL
Nitrite, UA: NEGATIVE
Protein Ur, POC: NEGATIVE mg/dL
Spec Grav, UA: <=1.005 — AB
Urobilinogen, UA: 0.2 U/dL
pH, UA: 6

## 2024-01-27 MED ORDER — NITROFURANTOIN MONOHYD MACRO 100 MG PO CAPS: 100.0000 mg | ORAL_CAPSULE | Freq: Two times a day (BID) | ORAL | 0 refills | Status: AC

## 2024-01-27 NOTE — ED Triage Notes (Signed)
 Pt here with family sts urinary frequency with accidents in depends x 3 days now with some yellow discharge per family; pt is wheelchair bound

## 2024-01-27 NOTE — ED Provider Notes (Signed)
 " [facility]    CSN: [id number] Arrival date & time: 01/27/24  1300      History   Chief Complaint Chief Complaint  Patient presents with   Dysuria    HPI Kristen Boyle is a 64 y.o. female.   Pt presents today due to dysuria, frequency, and urinary incontinence for the past 3 days. Pt denies fever, chills, nausea, vomiting, lower abdominal, or back pain.   The history is provided by the patient.  Dysuria   Past Medical History:  Diagnosis Date   Abnormality of gait 06/10/2012   Depression    Headache    Hyperlipidemia    MS (multiple sclerosis)     Patient Active Problem List   Diagnosis Date Noted   Chronic left shoulder pain 04/17/2023   Acute pain of left foot 04/17/2023   Osteoarthritis of left knee 03/13/2022   Elevated serum creatinine 10/04/2021   Right wrist pain 07/19/2020   Hot flashes due to menopause 12/01/2019   Encounter for therapeutic drug monitoring 04/12/2016   Constipation 04/12/2016   Vaginal atrophy 04/12/2016   Bilateral lower extremity edema with recent DOE 11/02/2015   Microcytosis 10/16/2014   Vitamin D  insufficiency 04/20/2014   Abnormality of gait 09/13/2013   Depression 09/19/2011   Multiple sclerosis 03/06/2011   Healthcare maintenance 03/06/2011   Hyperlipidemia 09/17/2010    Past Surgical History:  Procedure Laterality Date   CHOLECYSTECTOMY N/A 06/20/2015   Procedure: LAPAROSCOPIC CHOLECYSTECTOMY;  Surgeon: Lynda Leos, MD;  Location: [facility];  Service: General;  Laterality: N/A;   DENTAL SURGERY     TUBAL LIGATION Bilateral     OB History   No obstetric history on file.      Home Medications    Prior to Admission medications  Medication Sig Start Date End Date Taking? Authorizing Provider  nitrofurantoin , macrocrystal-monohydrate, (MACROBID ) 100 MG capsule Take 1 capsule (100 mg total) by mouth 2 (two) times daily. 01/27/24  Yes Andra Corean BROCKS, PA-C  atorvastatin  (LIPITOR) 20 MG tablet  Take 1 tablet (20 mg total) by mouth daily. 06/03/22   Atway, Rayann N, DO  BIOTIN PO Take by mouth.    [provider]  diclofenac  Sodium (VOLTAREN ) 1 % GEL Apply 2 g topically 4 (four) times daily. 04/17/23   Gabino Boga, MD  gabapentin  (NEURONTIN ) 300 MG capsule Take 1 capsule by mouth at bedtime Patient taking differently: Take 300 mg by mouth as needed. 01/28/23   Sater, Charlie DELENA, MD  ibuprofen  (ADVIL ,MOTRIN ) 200 MG tablet Take 400 mg by mouth every 6 (six) hours as needed (For pain.).    [provider]  ocrelizumab  (OCREVUS ) 300 MG/10ML injection INFUSE 600MG  IN 0.9% NS INTRAVENOUSLY AT 40ML\HR AND INCREASE BY 40ML\HR EVERY 30 MINUTES (MAX 200ML\HR) OVER AT LEAST 3.5HRS AS DIRECTED EVERY 6 MONTHS 01/23/23   Sater, Charlie DELENA, MD  polyethylene glycol (MIRALAX ) 17 g packet Mix 1 packet (17 g) according to package directions and take by mouth 2 (two) times daily. 11/04/23   Tobie Gaines, DO  senna (SENOKOT) 8.6 MG TABS tablet Take 1 tablet (8.6 mg total) by mouth daily. 11/04/23   Tobie Gaines, DO  VITAMIN D  PO Take by mouth.    [provider]    Family History Family History  Problem Relation Age of Onset   Alcoholism Father    Colon cancer Maternal Aunt    Diabetes Paternal Aunt    Dementia Paternal Grandmother  Senile dementia of Alzheimer's type   Colon cancer Cousin    Multiple sclerosis Cousin     Social History Social History[1]   Allergies   Gilenya [fingolimod]   Review of Systems Review of Systems  Genitourinary:  Positive for dysuria.     Physical Exam Triage Vital Signs ED Triage Vitals [01/27/24 1347]  Encounter Vitals Group     BP 128/76     Girls Systolic BP Percentile      Girls Diastolic BP Percentile      Boys Systolic BP Percentile      Boys Diastolic BP Percentile      Pulse Rate 65     Resp 18     Temp 98.1 F (36.7 C)     Temp Source Oral     SpO2 95 %     Weight      Height      Head Circumference       Peak Flow      Pain Score 0     Pain Loc      Pain Education      Exclude from Growth Chart    No data found.  Updated Vital Signs BP 128/76 (BP Location: Left Arm)   Pulse 65   Temp 98.1 F (36.7 C) (Oral)   Resp 18   SpO2 95%   Visual Acuity Right Eye Distance:   Left Eye Distance:   Bilateral Distance:    Right Eye Near:   Left Eye Near:    Bilateral Near:     Physical Exam Vitals and nursing note reviewed.  Constitutional:      General: She is not in acute distress.    Appearance: Normal appearance. She is not ill-appearing, toxic-appearing or diaphoretic.  Eyes:     General: No scleral icterus. Cardiovascular:     Rate and Rhythm: Normal rate and regular rhythm.     Heart sounds: Normal heart sounds.  Pulmonary:     Effort: Pulmonary effort is normal. No respiratory distress.     Breath sounds: Normal breath sounds. No wheezing or rhonchi.  Abdominal:     General: Abdomen is flat. Bowel sounds are normal.     Palpations: Abdomen is soft.     Tenderness: There is no abdominal tenderness. There is no right CVA tenderness or left CVA tenderness.  Skin:    General: Skin is warm.  Neurological:     Mental Status: She is alert and oriented to person, place, and time.  Psychiatric:        Mood and Affect: Mood normal.        Behavior: Behavior normal.      UC Treatments / Results  Labs (all labs ordered are listed, but only abnormal results are displayed) Labs Reviewed  POCT URINE DIPSTICK - Abnormal; Notable for the following components:      Result Value   Color, UA light yellow (*)    Spec Grav, UA <=1.005 (*)    Blood, UA small (*)    Leukocytes, UA Small (1+) (*)    All other components within normal limits  URINE CULTURE    EKG   Radiology No results found.  Procedures Procedures (including critical care time)  Medications Ordered in UC Medications - No data to display  Initial Impression / Assessment and Plan / UC Course  I  have reviewed the triage vital signs and the nursing notes.  Pertinent labs & imaging results that were available during  my care of the patient were reviewed by me and considered in my medical decision making (see chart for details).    Final Clinical Impressions(s) / UC Diagnoses   Final diagnoses:  Acute cystitis with hematuria     Discharge Instructions      You have a UTI, sent Macrobid  to pharmacy for symptoms.   If lab testing of urine shows that you need a different antibiotic then we will call and let you know.      ED Prescriptions     Medication Sig Dispense Auth. Provider   nitrofurantoin , macrocrystal-monohydrate, (MACROBID ) 100 MG capsule Take 1 capsule (100 mg total) by mouth 2 (two) times daily. 10 capsule Andra Corean BROCKS, PA-C      PDMP not reviewed this encounter.    [1]  Social History Tobacco Use   Smoking status: Never   Smokeless tobacco: Never  Vaping Use   Vaping status: Never Used  Substance Use Topics   Alcohol use: No    Alcohol/week: 0.0 standard drinks of alcohol   Drug use: No     Andra Corean BROCKS, PA-C 01/27/24 1428  "

## 2024-01-27 NOTE — Telephone Encounter (Signed)
 FYI Only or Action Required?: FYI only for provider: daughter of pt states she will take the pt to [facility] so she can be seen today.  Patient was last seen in primary care on 11/04/2023 by Tobie Gaines, DO.  Called Nurse Triage reporting Urinary Tract Infection.  Symptoms began several days ago.  Interventions attempted: Nothing.  Symptoms are: stable.  Triage Disposition: See Physician Within 24 Hours  Patient/caregiver understands and will follow disposition?: Yes  Copied from CRM #[id number]. Topic: Clinical - Red Word Triage >> Jan 27, 2024 10:29 AM DeAngela L wrote: Red Word that prompted transfer to Nurse Triage: Daughter Cassandra calling cause the patient not able to empty her bladder completely, has a discharge and using the restroom a lot   Daughter [name] hermanns [phone] Reason for Disposition  Urinating more frequently than usual (i.e., frequency) OR new-onset of the feeling of an urgent need to urinate (i.e., urgency)  Answer Assessment - Initial Assessment Questions 1. SYMPTOM: What's the main symptom you're concerned about? (e.g., frequency, incontinence)     Urinary frequency with yellow discharge in adult diaper  2. ONSET: When did the  symptoms  start?     3 days  3. PAIN: Is there any pain? If Yes, ask: How bad is it? (Scale: 1-10; mild, moderate, severe)     Denies  4. CAUSE: What do you think is causing the symptoms?     Daughter of pt suspects UTI  5. OTHER SYMPTOMS: Do you have any other symptoms? (e.g., blood in urine, fever, flank pain, pain with urination) Denies  Protocols used: Urinary Symptoms-A-AH

## 2024-01-27 NOTE — Discharge Instructions (Addendum)
 You have a UTI, sent Macrobid  to pharmacy for symptoms.   If lab testing of urine shows that you need a different antibiotic then we will call and let you know.

## 2024-01-28 ENCOUNTER — Encounter: Payer: Self-pay | Admitting: Physical Therapy

## 2024-01-29 ENCOUNTER — Ambulatory Visit: Admitting: Physical Therapy

## 2024-01-29 ENCOUNTER — Telehealth: Payer: Self-pay | Admitting: *Deleted

## 2024-01-29 ENCOUNTER — Ambulatory Visit: Admitting: Occupational Therapy

## 2024-01-29 ENCOUNTER — Ambulatory Visit (HOSPITAL_COMMUNITY): Payer: Self-pay

## 2024-01-29 LAB — URINE CULTURE: Culture: NO GROWTH

## 2024-01-29 NOTE — Telephone Encounter (Signed)
 Mammogram appointment February 9.2026 @ 1:20 pm to arrive 1:00 pm / [facility]- [phone]. Appointment mailed to patient with the information regarding the 75.00 no show fee.

## 2024-02-02 ENCOUNTER — Telehealth (HOSPITAL_BASED_OUTPATIENT_CLINIC_OR_DEPARTMENT_OTHER): Payer: Self-pay | Admitting: Student

## 2024-02-02 ENCOUNTER — Ambulatory Visit: Admitting: Occupational Therapy

## 2024-02-02 ENCOUNTER — Ambulatory Visit: Admitting: Physical Therapy

## 2024-02-02 ENCOUNTER — Other Ambulatory Visit: Payer: Self-pay | Admitting: Neurology

## 2024-02-02 DIAGNOSIS — R29818 Other symptoms and signs involving the nervous system: Secondary | ICD-10-CM

## 2024-02-02 DIAGNOSIS — Z741 Need for assistance with personal care: Secondary | ICD-10-CM

## 2024-02-02 DIAGNOSIS — R296 Repeated falls: Secondary | ICD-10-CM

## 2024-02-02 DIAGNOSIS — G35D Multiple sclerosis, unspecified: Secondary | ICD-10-CM

## 2024-02-02 DIAGNOSIS — R278 Other lack of coordination: Secondary | ICD-10-CM

## 2024-02-02 DIAGNOSIS — R2689 Other abnormalities of gait and mobility: Secondary | ICD-10-CM

## 2024-02-02 DIAGNOSIS — M6281 Muscle weakness (generalized): Secondary | ICD-10-CM

## 2024-02-02 DIAGNOSIS — R2681 Unsteadiness on feet: Secondary | ICD-10-CM

## 2024-02-02 NOTE — Telephone Encounter (Signed)
 Patient physical therapy said she needs a note saying that is medial ness ory to get a brace for her drop foot. Elvie Ipojijb is the Pt [address] [place]  [phone]) [phone]. Patient goes back to physical therapy on Monday

## 2024-02-02 NOTE — Therapy (Unsigned)
 " OUTPATIENT OCCUPATIONAL THERAPY NEURO TREATMENT  Patient Name: ANUREET BRUINGTON MRN: [id number] DOB:August 29, 1960, 64 y.o., female Today's Date: 02/02/2024  PCP: Bernadine Manos, MD REFERRING PROVIDER: Lovie Clarity, MD  END OF SESSION:  OT End of Session - 02/02/24 1019     Visit Number 5    Number of Visits 20    Date for Recertification  03/19/24    Authorization Type Medicare A&B/Medicaid 2025    OT Start Time 1020    OT Stop Time 1100    OT Time Calculation (min) 40 min    Equipment Utilized During Treatment Mat table    Activity Tolerance Patient tolerated treatment well    Behavior During Therapy [facility] for tasks assessed/performed          Past Medical History:  Diagnosis Date   Abnormality of gait 06/10/2012   Depression    Headache    Hyperlipidemia    MS (multiple sclerosis)    Past Surgical History:  Procedure Laterality Date   CHOLECYSTECTOMY N/A 06/20/2015   Procedure: LAPAROSCOPIC CHOLECYSTECTOMY;  Surgeon: Lynda Leos, MD;  Location: [facility];  Service: General;  Laterality: N/A;   DENTAL SURGERY     TUBAL LIGATION Bilateral    Patient Active Problem List   Diagnosis Date Noted   Chronic left shoulder pain 04/17/2023   Acute pain of left foot 04/17/2023   Osteoarthritis of left knee 03/13/2022   Elevated serum creatinine 10/04/2021   Right wrist pain 07/19/2020   Hot flashes due to menopause 12/01/2019   Encounter for therapeutic drug monitoring 04/12/2016   Constipation 04/12/2016   Vaginal atrophy 04/12/2016   Bilateral lower extremity edema with recent DOE 11/02/2015   Microcytosis 10/16/2014   Vitamin D  insufficiency 04/20/2014   Abnormality of gait 09/13/2013   Depression 09/19/2011   Multiple sclerosis 03/06/2011   Healthcare maintenance 03/06/2011   Hyperlipidemia 09/17/2010    ONSET DATE: Referral: 11/04/2023  REFERRING DIAG: G35.D (ICD-10-CM) - Multiple sclerosis  THERAPY DIAG:  Muscle weakness (generalized)  Other lack of  coordination  Unsteadiness on feet  Repeated falls  Need for assistance with personal care  Other symptoms and signs involving the nervous system  Rationale for Evaluation and Treatment: Rehabilitation  SUBJECTIVE:   SUBJECTIVE STATEMENT:  Pt alone x 3 days recnelty with daughter away and rpeorts being more tired ie) trying to get in/out of bed, increased toielting needs s/p UTI etc.  Pt reports she has been having increased need to go to the bathroom with increased accidents requiting her to change her diaper more often.   Pt accompanied by: self and family member - daughter - Agricultural Consultant (flight attendant)  PERTINENT HISTORY: MS since 2013 as well as history of depression, LE edema, osteoarthritis (B knees)  PRECAUTIONS: Fall  WEIGHT BEARING RESTRICTIONS: No  PAIN:  Are you having pain? No   FALLS: Has patient fallen in last 6 months? Yes. Number of falls Has fallen trying to get up into the bed, and trying to get to the bathroom.   LIVING ENVIRONMENT: Lives with: lives with their family - daughter & boyfriend Lives in: House/apartment - townhome Stairs: No Has following equipment at home: Single point cane, Environmental Consultant - 2 wheeled, Environmental Consultant - 4 wheeled, Wheelchair (manual), Tour manager, and Grab bars  PLOF: Independent with basic ADLs and Needs assistance with homemaking  PATIENT GOALS: Pt reported get up when I fall and walk faster to get to the bathroom.  OBJECTIVE:  Note: Objective measures were completed at  Evaluation unless otherwise noted.  HAND DOMINANCE: Right  ADLs:   Pt's daughter reports she will help pt due to her low energy and they have no assistance coming to home.  Pt can do things herself but daughter helps so she has energy for the rest of the day.   Daughter may be gone over night due to job as a financial controller.  Pt tends to stay in her room where she has a refrigerator but she does have to walk to the bathroom, just outside the bedroom door and pt  does so while holding onto furniture.   Overall ADLs: Mod I for basic ADLs but gets assistance when available and has history of falls Transfers/ambulation related to ADLs: Can get OOB on her own but still has trouble sometimes due to weak core.  Pt's daughter helps her back to bed Eating: Ind Grooming: Daughter combs hair, Pt rinses her mouth herself UB Dressing: Pt can do this on her own. LB Dressing: Pt reports changing her own pull-up s/p incontinence  Toileting: Has accidents of B&B and changes her own pull up.  Accidents of BM occur mostly if she has to take something for constipation Bathing: Pt reports she will get in the shower and clean herself if she has an accident. Tub Shower transfers: Pt holds onto furniture and walk to get to the bathroom.  Pt reports that upon return from the bathroom she usually has to sit in her chair a little bit and then she works to get back in the bed - ie) It takes me awhile.  Daughter reports that is is hard for pt to lift her legs high enoguh to get them in the bed Equipment: Transfer tub bench  IADLs: Shopping: Daughter Light housekeeping: Daughter, Pt reports she will help fold some laundry Meal Prep: Has refrigerator in her bedroom, and will get foods like yogurt, fruit, salads etc (no microwave), when her daughter is not home. Community mobility: Assisted with WC Medication management: Pt takes meds from pill bottles Financial management: TBA Handwriting: TBA  MOBILITY STATUS: Needs Assist: furniture walks at home (walker does not fit through the doorways), Hx of falls, and difficulty carrying objections with ambulation  POSTURE COMMENTS:  rounded shoulders and forward head Sitting balance: No issues while seated in WC at eval  ACTIVITY TOLERANCE: Activity tolerance: Limited - prefers the bed  FUNCTIONAL OUTCOME MEASURES: TBA - PSFS  Modified Barthel Index of ADLS ~ Modified Barthel Index Anastacio et al.) = 59 points Interpretation:  21-60 Severe dependency  UPPER EXTREMITY ROM:    Active ROM - TBA Right eval Left eval  Shoulder flexion    Shoulder abduction    Shoulder adduction    Shoulder extension    Shoulder internal rotation    Shoulder external rotation    Elbow flexion    Elbow extension    Wrist flexion    Wrist extension    Wrist ulnar deviation    Wrist radial deviation    Wrist pronation    Wrist supination    (Blank rows = not tested)  UPPER EXTREMITY MMT:     MMT - TBA Right eval Left eval  Shoulder flexion    Shoulder abduction    Shoulder adduction    Shoulder extension    Shoulder internal rotation    Shoulder external rotation    Middle trapezius    Lower trapezius    Elbow flexion    Elbow extension    Wrist flexion  Wrist extension    Wrist ulnar deviation    Wrist radial deviation    Wrist pronation    Wrist supination    (Blank rows = not tested)  HAND FUNCTION: Grip strength: Right: 7.7, 9.0  lbs; Left: 11.9, 8.1 lbs  COORDINATION: 01/12/24 9 Hole Peg test: Right: 47.28  sec; Left: 45.20  sec Box and Blocks:  Right 29 blocks, Left 31 blocks R suppoorted on edge of Box & blocks test box verus pt lifted her L arm to complete the same test  SENSATION: TBA  EDEMA: NA  MUSCLE TONE: WFL  COGNITION: Overall cognitive status: Impaired - pt deferred to daughter to answer questions and indicated assistance needed with written material  VISION: Subjective report: Limited by time at eval Baseline vision: TBA Visual history: TBA  VISION ASSESSMENT: Not tested  Patient has difficulty with following activities due to following visual impairments: None reported at eval  EVAL OBSERVATIONS: Pt arrived with daughter in manual WC.  The pt appears well kept and has jacket donned.  Pt has limitations with end range ROM and is weak in BUEs affecting safety with ADLs.  She often defers to her daughter to answer questions and admits to falls but unable to correctly  identify need to keep her phone with her at all times for safety.                                                                                                                              TODAY'S TREATMENT:    - Self Care education and training completed for duration as noted below including:  OT provided further education re: scheduled toileting, possible bedside commode [facility]) use for safety during urgency and suggestion   Practiced bed mobility and ADL transfers as needed to get to/from bathroom  PATIENT EDUCATION: Education details: Printmaker Person educated: Patient and Child(ren) Education method: Programmer, Multimedia, Facilities Manager, Verbal cues, and Handouts Education comprehension: verbalized understanding, returned demonstration, verbal cues required, tactile cues required, and needs further education  HOME EXERCISE PROGRAM: 01/12/24: Putty HEP Access Code: [facility]  01/26/24: Energy Conservation and Scheduled Toileting Analysis   GOALS: Goals reviewed with patient? Yes  SHORT TERM GOALS: Target date: 01/21/24  Patient will demonstrate initial B UE HEP with 25% verbal cues or less for proper execution. Baseline: New to outpt OT s/p MS dx 2013 Goal status: IN Progress  2.  Pt will participate in UE strengthening and functional tasks to improve grip strength by at least 2-3 lbs bilaterally to support safe device use and transfers. Baseline: Right: 7.7, 9.0  lbs; Left: 11.9, 8.1 lbs Goal status: IN Progress  3.  Patient will demo improved FM coordination as evidenced by completing nine-hole peg with use of R/L UE in as close to 40 seconds as possible.  Baseline: 01/12/24 9 Hole Peg test: Right: 47.28  sec; Left: 45.20  sec Box and Blocks:  Right 29 blocks, Left 31 blocks Goal  status: IN Progress  4.  Pt/daughter will verbalize understanding of adapted strategies and/or equipment PRN to increase safety and independence with ADLs and IADLs (I.e. with decreased pain and  joint protection).             Baseline: New to outpt OT             Goal Status: IN Progress  5.  Pt will complete toileting routine (transfer, hygiene, return to bed) with no episodes of instability or near-fall, utilizing energy conservation and appropriate DME as trained. Baseline: History of falls Goal status: IN Progress   LONG TERM GOALS: Target date: 03/20/24  Patient will demonstrate updated B UE HEP with visual handouts only for proper execution to improve strength and coordination.  Baseline: New to outpt OT s/p MS dx 2013; Grip strength: Right: 7.7, 9.0  lbs; Left: 11.9, 8.1 lbs Goal status: INITIAL  2.  Pt will implement use of 3-4 energy conservation strategies (task modification, planned rest breaks, environmental setup, adaptive tools) to prevent exhaustion and reduce reliance on her daughter for ADLs. Baseline: New to outpt OT s/p MS dx 2013 Goal status: INITIAL  3.  Pt will use a memory strategies (phone reminders, notebook, wall calendar) to track medications, appointments and safety recommendations r/t AE and phone use when alone with 90% consistency Baseline: New to outpt OT  Goal status: INITIAL  4.  Patient will report at least two-point increase in average PSFS score or at least three-point increase in a single activity score indicating functionally significant improvement given minimum detectable change. Baseline: TBA total score (See above for individual activity scores) Goal status: INITIAL   ASSESSMENT:  CLINICAL IMPRESSION: Patient is a 64 y.o. female who was seen today for occupational therapy treatment for Multiple Sclerosis. Pt/daughter responded well to ADL comp strategies/equipment education today as well as energy conservation training. Pt would benefit from further skilled OT services in the outpatient setting to work on impairments as noted below to help pt return to PLOF as able.    PERFORMANCE DEFICITS: in functional skills including ADLs, IADLs,  coordination, dexterity, ROM, strength, pain, flexibility, Fine motor control, Gross motor control, mobility, balance, endurance, continence, decreased knowledge of precautions, decreased knowledge of use of DME, and UE functional use, cognitive skills including learn, memory, problem solving, safety awareness, thought, and understand, and psychosocial skills including coping strategies, environmental adaptation, habits, interpersonal interactions, and routines and behaviors.   IMPAIRMENTS: are limiting patient from ADLs, IADLs, rest and sleep, leisure, and social participation.   CO-MORBIDITIES: may have co-morbidities  that affects occupational performance. Patient will benefit from skilled OT to address above impairments and improve overall function.  REHAB POTENTIAL: Fair Duration of dx  PLAN:  OT FREQUENCY: 1-2x/week  OT DURATION: 12 weeks - POC extended for scheduled purposes  PLANNED INTERVENTIONS: 97168 OT Re-evaluation, 97535 self care/ADL training, 02889 therapeutic exercise, 97530 therapeutic activity, 97112 neuromuscular re-education, 97140 manual therapy, 97113 aquatic therapy, [id number] Cognitive training (first 15 min), 02869 Cognitive training(each additional 15 min), 02457 Wheelchair Management, 97760 Orthotic Initial, [id number] Orthotic/Prosthetic subsequent, balance training, functional mobility training, visual/perceptual remediation/compensation, psychosocial skills training, energy conservation, coping strategies training, patient/family education, and DME and/or AE instructions  RECOMMENDED OTHER SERVICES: May need dental consult and potentially ST services  CONSULTED AND AGREED WITH PLAN OF CARE: Patient and family member/caregiver  PLAN FOR NEXT SESSION:  Assess ROM/vision & PSFS - get to bathroom HEPs - putty (12/22), coordination etc AE considerations ADL comp training  Retest/check goals  Clarita LITTIE Pride, OT 02/02/2024, 10:20 AM           "

## 2024-02-03 ENCOUNTER — Encounter: Payer: Self-pay | Admitting: Physical Therapy

## 2024-02-03 NOTE — Therapy (Signed)
 " OUTPATIENT PHYSICAL THERAPY NEURO TREATMENT NOTE   Patient Name: Kristen Boyle MRN: [id number] DOB:04-Aug-1960, 64 y.o., female Today's Date: 02/03/2024   PCP: Bernadine Manos, MD REFERRING PROVIDER: Emiliano Leonce CROME, PA-C  END OF SESSION:  PT End of Session - 02/03/24 1215     Visit Number 5    Number of Visits 17    Date for Recertification  02/06/24    Authorization Type Medicare    Authorization Time Period 12-09-23 - 02-21-24    Progress Note Due on Visit 10    PT Start Time [phone]   pt arrived 7 late for PT appt   PT Stop Time 1016    PT Time Calculation (min) 39 min    Equipment Utilized During Treatment Gait belt;Other (comment)   Swedish knee cage, Lt Ott[place]ock AFO   Activity Tolerance Patient tolerated treatment well    Behavior During Therapy WFL for tasks assessed/performed             Past Medical History:  Diagnosis Date   Abnormality of gait 06/10/2012   Depression    Headache    Hyperlipidemia    MS (multiple sclerosis)    Past Surgical History:  Procedure Laterality Date   CHOLECYSTECTOMY N/A 06/20/2015   Procedure: LAPAROSCOPIC CHOLECYSTECTOMY;  Surgeon: Lynda Leos, MD;  Location: [facility];  Service: General;  Laterality: N/A;   DENTAL SURGERY     TUBAL LIGATION Bilateral    Patient Active Problem List   Diagnosis Date Noted   Chronic left shoulder pain 04/17/2023   Acute pain of left foot 04/17/2023   Osteoarthritis of left knee 03/13/2022   Elevated serum creatinine 10/04/2021   Right wrist pain 07/19/2020   Hot flashes due to menopause 12/01/2019   Encounter for therapeutic drug monitoring 04/12/2016   Constipation 04/12/2016   Vaginal atrophy 04/12/2016   Bilateral lower extremity edema with recent DOE 11/02/2015   Microcytosis 10/16/2014   Vitamin D  insufficiency 04/20/2014   Abnormality of gait 09/13/2013   Depression 09/19/2011   Multiple sclerosis 03/06/2011   Healthcare maintenance 03/06/2011   Hyperlipidemia  09/17/2010    ONSET DATE: Referral date 10-17-23  REFERRING DIAG: M25.562,G89.29 (ICD-10-CM) - Chronic pain of left knee G35.D (ICD-10-CM) - Multiple sclerosis  THERAPY DIAG:  Muscle weakness (generalized)  Other abnormalities of gait and mobility  Other symptoms and signs involving the nervous system  Rationale for Evaluation and Treatment: Rehabilitation  SUBJECTIVE:  SUBJECTIVE STATEMENT: Pt reports last week was a tough week - daughter was out of town for work for 3 days - daughter states usually she is only gone for 1-2 days but had a longer trip with Acupuncturist.  Pt reports her Lt arm is a little sore today from probably having to use it more last week.  Daughter states the face to face visit has not been scheduled yet - did not get chance to do so since she was out of town last week Pt accompanied by: family member - daughter, [name]  PERTINENT HISTORY:  MS since 1999 as well as history of depression, LE edema, osteoarthritis (B knees)   Per chart note; Currently, gait is worse and she stopped using a walker ater a fall.   She felt she had some benefit from PT in the past but has not done over last year.  She can transfer independently most of the time but sometimes needs assistance.    She has left > right leg weakness.  She notes mild hand weakness.  Pressing buttons is more difficult.    She has left leg stiffness/spasticity as well.   She has numbness in her left hand and both legs.  She did not try the gabapentin .      She has some urinary incontinence so wears Depends.     She only gets a sensation a few seconds before she needs to go.    She has had notable dysconjugate gaze since 2017 or so.   Her left eye has very poor vision so does not note diplopia. .  Visual symptoms OS  started before the MS diagnosis but were mild until more recently.    PAIN:  Are you having pain? No  PRECAUTIONS: Fall  RED FLAGS: None   WEIGHT BEARING RESTRICTIONS: No  FALLS: Has patient fallen in last 6 months? Yes. Number of falls 2 - used the cover from the bed to pull herself up   LIVING ENVIRONMENT: Lives with: lives with their family and (daughter and her boyfriend) Lives in: Other townhome Stairs: No - small threshold only Has following equipment at home: Environmental Consultant - 2 wheeled, Environmental Consultant - 4 wheeled, Wheelchair (manual), Tour manager, and Grab bars  PLOF: Independent with basic ADLs, Independent with household mobility with device, Independent with transfers, Requires assistive device for independence, and Needs assistance with homemaking  PATIENT GOALS: be able to transfer from supine to sitting; improve transfers and walking   OBJECTIVE:  Note: Objective measures were completed at Evaluation unless otherwise noted.  DIAGNOSTIC FINDINGS: None since Aug. 2022 (MRI)  COGNITION: Overall cognitive status: Within functional limits for tasks assessed   SENSATION: Not tested  COORDINATION: Decreased LLE due to minimal AROM due to weakness   MUSCLE TONE: LLE: Moderate extensor tone   POSTURE: rounded shoulders, forward head, and increased thoracic kyphosis  LOWER EXTREMITY ROM:   RLE WFL's;  minimal AROM LLE    LOWER EXTREMITY MMT:    MMT Right Eval Left Eval  Hip flexion 3- 2-  Hip extension 2+ - 3-/5 2 - 2+/5   Hip abduction    Hip adduction    Hip internal rotation    Hip external rotation    Knee flexion 3 2-  Knee extension 4 3+  Ankle dorsiflexion 3+ 2-  Ankle plantarflexion    Ankle inversion    Ankle eversion    (Blank rows = not tested)  BED MOBILITY:  Findings: Sit to supine Min  A Supine to sit Min A Rolling to Right Min A  TRANSFERS: Sit to stand: CGA  Assistive device utilized: Environmental Consultant - 2 wheeled     Stand to sit: CGA  Assistive  device utilized: Environmental Consultant - 2 wheeled     Needs cues for correct hand placement RAMP:  Not tested  CURB:  Not tested  STAIRS: Not tested GAIT: Findings: Gait Characteristics: step through pattern, decreased step length- Left, decreased hip/knee flexion- Left, decreased ankle dorsiflexion- Left, and genu recurvatum- Left, Distance walked: 15', Assistive device utilized:Walker - 2 wheeled, Level of assistance: Min A and Mod A, and Comments: mod assist with fatigue15';  severe hyperextension Lt knee in stance - needs custom molded AFO   FUNCTIONAL TESTS:  Pt unable to stand unsupported                                                                                                                               TREATMENT DATE: 02-02-24  TherEx: Pt transferred wheelchair to mat with CGA using squat pivot transfer (toward Lt side)  Mat exercises; bridging x 10 reps; Lt 1/2 bridging x 5 reps                   Lt heel slide AAROM with pt assisting as able - 10 reps Lt hip abduction/adduction 10 reps in hooklying position Lt hip extension control exercise off side of mat with assistance to control extension due to discomfort with stretch - PT prevented LLE from extending past approx. -30 degrees for comfort  SciFit level 2.5 x 5 with UE's and LE's for strengthening - pt performed from wheelchair  Gait: Toe off AFO and Swedish knee cage used on LLE - pt gait trained with RW approx. 50' (1st rep) then needed seated rest period,  with min to mod assist; cues to lift RLE for increased clearance; pt needs cues for correct hand placement with sit to/from stand with use of RW; 2nd rep after approx. 2 seated rest break - pt gait trained approx. 60' back to mat table for distance of 1 full lap with 1 seated rest period       Access Code: [email or web address] URL: [email or web address] Date: 01/19/2024 Prepared by: Rock Kussmaul  Exercises - Single Leg Bridge  - 1 x daily - 7 x weekly - 1 sets  - 10 reps - Supine Lower Trunk Rotation  - 1 x daily - 7 x weekly - 1 sets - 2 reps - 10 hold - Supine Bridge  - 1 x daily - 7 x weekly - 1 sets - 10 reps - Hip and Knee Extension and Flexion Caregiver PROM  - 1 x daily - 7 x weekly - 1 sets - 10 reps - Clamshell  - 1 x daily - 7 x weekly - 1 sets - 10 reps - Bent Knee Fallouts  - 1 x daily - 7 x weekly - 1 sets - 10  reps   PATIENT EDUCATION: Education details:  informed pt and daughter that a face to face visit with referring MD Randene [place], [place]) would need to be scheduled in order to obtain the custom Lt AFO (order for AFO has been obtained); [employer] [email or web address] Person educated: Patient and Child(ren) Education method: Explanation Education comprehension: verbalized understanding  HOME EXERCISE PROGRAM: [employer] - SEE ABOVE  GOALS: Goals reviewed with patient? Yes  SHORT TERM GOALS: Target date: 01-16-24 (pushed out 1 week to allow for scheduling delays)  Pt will transfer sit to stand with bil. UE support from mat and will stand for 2 at counter with UE support with CGA/SBA for increased independence with ADL's. Baseline: CGA - verbal cues needed for correct hand placement Goal status: Goal met 01-26-24  2.  Pt will transfer sit to supine with CGA for LE management. Baseline:  Goal status: Goal met 01-19-24  3.  Initiate process to obtain custom AFO for LLE.  Baseline:  Goal status: Goal met 01-19-24  4.  Pt will amb. 5' with RW with +1 min assist for increased household accessibility.  Baseline:  Goal status: In Progress - 01-19-24  5.  Caregiver will verbalize understanding of assisting pt in HEP consisting of stretching, strengthening, and ROM for bil. LE's. Baseline:  Goal status: In Progress - pt has attended only 2 PT sessions since eval - 01-19-24   LONG TERM GOALS: Target date: 02-13-23  Pt will be modified independent with bed mobility including sit to/from supine and rolling to Rt and Lt sides.  Baseline:   Goal status: INITIAL  2.  Modified independent with transfers with pt demonstrating correct hand placement without any cues.  Baseline:  Goal status: INITIAL  3.  Pt will be able to stand at counter/sink for at least 3 with supervision for increased independence with self care & ADL's.  Baseline:  Goal status: INITIAL  4.  Pt will don/doff custom AFO (if obtained) with min assist. Baseline:  Goal status: INITIAL  5.  Pt will amb. 75' with RW with custom AFO on LLE for increased household ambulation. Baseline:  Goal status: INITIAL  6.  Independent in instructing caregiver in HEP as needed for LE strengthening and stretching.  Baseline:  Goal status: INITIAL  ASSESSMENT:  CLINICAL IMPRESSION: PT session focused on gait training with use of Swedish knee cage and [place] AFO on LLE.  Pt requested seated rest period after amb. Approx. 38' due to fatigue and also due to c/o Lt arm discomfort.  Pt rated Lt arm discomfort 8/10 at start of session but reported slight decrease in pain with intensity rating 5/10 at end of session.  Cont with POC.   OBJECTIVE IMPAIRMENTS: Abnormal gait, decreased activity tolerance, decreased balance, decreased endurance, decreased mobility, decreased ROM, decreased strength, impaired tone, and impaired UE functional use.   ACTIVITY LIMITATIONS: carrying, lifting, bending, standing, squatting, stairs, transfers, bed mobility, bathing, reach over head, hygiene/grooming, and locomotion level  PARTICIPATION LIMITATIONS: meal prep, cleaning, laundry, shopping, community activity, and pt does not drive  PERSONAL FACTORS: Age, Fitness, Time since onset of injury/illness/exacerbation, and 1-2 comorbidities: MS  are also affecting patient's functional outcome.   REHAB POTENTIAL: Good - due to chronicity of deficits  CLINICAL DECISION MAKING: Evolving/moderate complexity  EVALUATION COMPLEXITY: Moderate  PLAN:  PT FREQUENCY: 2x/week  PT DURATION: 8 weeks  + eval  PLANNED INTERVENTIONS: 97110-Therapeutic exercises, 97530- Therapeutic activity, [id number]- Neuromuscular re-education, 97535- Self Care, [id number]- Gait training, [phone]- Orthotic Initial, [phone]-  Orthotic/Prosthetic subsequent, and Patient/Family education  PLAN FOR NEXT SESSION:  check LTG's - renew? use Swedish knee cage and AFO with gait training; face to face visit scheduled?   R[place]anna [place], PT 02/03/2024, 12:18 PM        "

## 2024-02-04 ENCOUNTER — Encounter (HOSPITAL_BASED_OUTPATIENT_CLINIC_OR_DEPARTMENT_OTHER): Payer: Self-pay | Admitting: *Deleted

## 2024-02-04 ENCOUNTER — Encounter (HOSPITAL_BASED_OUTPATIENT_CLINIC_OR_DEPARTMENT_OTHER): Payer: Self-pay | Admitting: Student

## 2024-02-04 NOTE — Telephone Encounter (Signed)
 Note has been done

## 2024-02-05 ENCOUNTER — Ambulatory Visit: Admitting: Physical Therapy

## 2024-02-05 ENCOUNTER — Ambulatory Visit: Admitting: Occupational Therapy

## 2024-02-09 ENCOUNTER — Ambulatory Visit: Admitting: Occupational Therapy

## 2024-02-09 ENCOUNTER — Ambulatory Visit: Admitting: Physical Therapy

## 2024-02-10 ENCOUNTER — Ambulatory Visit: Admitting: Occupational Therapy

## 2024-02-10 ENCOUNTER — Ambulatory Visit: Admitting: Physical Therapy

## 2024-02-12 ENCOUNTER — Ambulatory Visit: Admitting: Physical Therapy

## 2024-02-12 ENCOUNTER — Ambulatory Visit: Admitting: Occupational Therapy

## 2024-02-16 ENCOUNTER — Ambulatory Visit: Admitting: Occupational Therapy

## 2024-02-16 ENCOUNTER — Ambulatory Visit: Admitting: Physical Therapy

## 2024-02-17 ENCOUNTER — Ambulatory Visit: Admitting: Physical Therapy

## 2024-02-17 ENCOUNTER — Ambulatory Visit: Admitting: Occupational Therapy

## 2024-02-19 ENCOUNTER — Ambulatory Visit: Admitting: Occupational Therapy

## 2024-02-19 ENCOUNTER — Ambulatory Visit: Admitting: Physical Therapy

## 2024-02-23 ENCOUNTER — Ambulatory Visit: Admitting: Occupational Therapy

## 2024-02-23 ENCOUNTER — Ambulatory Visit: Admitting: Physical Therapy

## 2024-03-01 ENCOUNTER — Ambulatory Visit: Admitting: Physical Therapy

## 2024-03-01 ENCOUNTER — Ambulatory Visit: Admitting: Occupational Therapy

## 2024-03-01 ENCOUNTER — Ambulatory Visit

## 2024-03-08 ENCOUNTER — Ambulatory Visit: Admitting: Physical Therapy

## 2024-03-08 ENCOUNTER — Ambulatory Visit: Admitting: Occupational Therapy

## 2024-07-01 ENCOUNTER — Ambulatory Visit: Admitting: Neurology
